# Patient Record
Sex: Female | Born: 1940
Health system: Southern US, Community
[De-identification: ages and names within clinical notes are randomized; demographics above are authoritative.]

## PROBLEM LIST (undated history)

## (undated) DIAGNOSIS — I471 Supraventricular tachycardia, unspecified: Secondary | ICD-10-CM

## (undated) DIAGNOSIS — I219 Acute myocardial infarction, unspecified: Secondary | ICD-10-CM

## (undated) DIAGNOSIS — I4719 Other supraventricular tachycardia: Secondary | ICD-10-CM

## (undated) DIAGNOSIS — I429 Cardiomyopathy, unspecified: Secondary | ICD-10-CM

## (undated) DIAGNOSIS — Z931 Gastrostomy status: Secondary | ICD-10-CM

## (undated) DIAGNOSIS — I10 Essential (primary) hypertension: Secondary | ICD-10-CM

## (undated) DIAGNOSIS — R1310 Dysphagia, unspecified: Secondary | ICD-10-CM

## (undated) DIAGNOSIS — M1990 Unspecified osteoarthritis, unspecified site: Secondary | ICD-10-CM

## (undated) DIAGNOSIS — I480 Paroxysmal atrial fibrillation: Secondary | ICD-10-CM

## (undated) DIAGNOSIS — D649 Anemia, unspecified: Secondary | ICD-10-CM

## (undated) DIAGNOSIS — I729 Aneurysm of unspecified site: Secondary | ICD-10-CM

## (undated) DIAGNOSIS — I5042 Chronic combined systolic (congestive) and diastolic (congestive) heart failure: Secondary | ICD-10-CM

## (undated) DIAGNOSIS — I2510 Atherosclerotic heart disease of native coronary artery without angina pectoris: Secondary | ICD-10-CM

## (undated) DIAGNOSIS — I6529 Occlusion and stenosis of unspecified carotid artery: Secondary | ICD-10-CM

## (undated) DIAGNOSIS — I4729 Other ventricular tachycardia: Secondary | ICD-10-CM

## (undated) DIAGNOSIS — E785 Hyperlipidemia, unspecified: Secondary | ICD-10-CM

## (undated) DIAGNOSIS — I472 Ventricular tachycardia: Secondary | ICD-10-CM

## (undated) DIAGNOSIS — I639 Cerebral infarction, unspecified: Secondary | ICD-10-CM

## (undated) HISTORY — DX: Other ventricular tachycardia: I47.29

## (undated) HISTORY — DX: Other supraventricular tachycardia: I47.19

## (undated) HISTORY — DX: Chronic combined systolic (congestive) and diastolic (congestive) heart failure: I50.42

## (undated) HISTORY — DX: Unspecified osteoarthritis, unspecified site: M19.90

## (undated) HISTORY — DX: Gastrostomy status: Z93.1

## (undated) HISTORY — DX: Supraventricular tachycardia, unspecified: I47.10

## (undated) HISTORY — DX: Dysphagia, unspecified: R13.10

## (undated) HISTORY — DX: Occlusion and stenosis of unspecified carotid artery: I65.29

## (undated) HISTORY — DX: Hyperlipidemia, unspecified: E78.5

## (undated) HISTORY — DX: Supraventricular tachycardia: I47.1

## (undated) HISTORY — DX: Paroxysmal atrial fibrillation: I48.0

## (undated) HISTORY — DX: Atherosclerotic heart disease of native coronary artery without angina pectoris: I25.10

## (undated) HISTORY — DX: Aneurysm of unspecified site: I72.9

## (undated) HISTORY — DX: Ventricular tachycardia: I47.2

## (undated) HISTORY — DX: Essential (primary) hypertension: I10

## (undated) HISTORY — DX: Cardiomyopathy, unspecified: I42.9

## (undated) HISTORY — DX: Acute myocardial infarction, unspecified: I21.9

## (undated) HISTORY — DX: Anemia, unspecified: D64.9

---

## 2003-11-28 ENCOUNTER — Ambulatory Visit (HOSPITAL_COMMUNITY): Admission: RE | Admit: 2003-11-28 | Discharge: 2003-11-28 | Payer: Self-pay | Admitting: Vascular Surgery

## 2003-12-02 ENCOUNTER — Ambulatory Visit (HOSPITAL_COMMUNITY): Admission: RE | Admit: 2003-12-02 | Discharge: 2003-12-02 | Payer: Self-pay | Admitting: Vascular Surgery

## 2004-03-31 ENCOUNTER — Encounter: Admission: RE | Admit: 2004-03-31 | Discharge: 2004-03-31 | Payer: Self-pay | Admitting: Neurology

## 2004-07-08 ENCOUNTER — Ambulatory Visit (HOSPITAL_COMMUNITY): Admission: RE | Admit: 2004-07-08 | Discharge: 2004-07-08 | Payer: Self-pay | Admitting: Neurology

## 2005-08-11 ENCOUNTER — Encounter: Payer: Self-pay | Admitting: Vascular Surgery

## 2005-08-11 ENCOUNTER — Ambulatory Visit (HOSPITAL_COMMUNITY): Admission: RE | Admit: 2005-08-11 | Discharge: 2005-08-11 | Payer: Self-pay | Admitting: Neurology

## 2005-08-25 ENCOUNTER — Ambulatory Visit (HOSPITAL_COMMUNITY): Admission: RE | Admit: 2005-08-25 | Discharge: 2005-08-25 | Payer: Self-pay | Admitting: Neurology

## 2006-04-18 ENCOUNTER — Encounter: Admission: RE | Admit: 2006-04-18 | Discharge: 2006-04-18 | Payer: Self-pay | Admitting: Neurology

## 2006-12-27 ENCOUNTER — Ambulatory Visit: Payer: Self-pay | Admitting: Vascular Surgery

## 2007-01-06 ENCOUNTER — Inpatient Hospital Stay (HOSPITAL_COMMUNITY): Admission: RE | Admit: 2007-01-06 | Discharge: 2007-01-07 | Payer: Self-pay | Admitting: Vascular Surgery

## 2007-01-06 ENCOUNTER — Ambulatory Visit: Payer: Self-pay | Admitting: Vascular Surgery

## 2007-01-06 ENCOUNTER — Encounter: Payer: Self-pay | Admitting: Vascular Surgery

## 2007-01-06 HISTORY — PX: CAROTID ENDARTERECTOMY: SUR193

## 2007-01-24 ENCOUNTER — Ambulatory Visit: Payer: Self-pay | Admitting: Vascular Surgery

## 2007-08-01 ENCOUNTER — Ambulatory Visit: Payer: Self-pay | Admitting: Vascular Surgery

## 2008-02-06 ENCOUNTER — Ambulatory Visit: Payer: Self-pay | Admitting: Vascular Surgery

## 2008-09-10 ENCOUNTER — Ambulatory Visit: Payer: Self-pay | Admitting: Vascular Surgery

## 2010-02-11 ENCOUNTER — Ambulatory Visit: Payer: Self-pay | Admitting: Vascular Surgery

## 2010-03-22 HISTORY — PX: HERNIA REPAIR: SHX51

## 2010-04-12 ENCOUNTER — Encounter: Payer: Self-pay | Admitting: Neurology

## 2010-08-04 NOTE — Procedures (Signed)
CAROTID DUPLEX EXAM   INDICATION:  Followup, carotid artery disease.   HISTORY:  Diabetes:  Yes.  Cardiac:  MI.  Hypertension:  Yes.  Smoking:  Quit.  Previous Surgery:  Right CEA with DPA, 01/06/07.  CV History:  Asymptomatic.  Amaurosis Fugax No, Paresthesias No, Hemiparesis No                                       RIGHT             LEFT  Brachial systolic pressure:         118               114  Brachial Doppler waveforms:         Biphasic          Biphasic  Vertebral direction of flow:        Antegrade (low flow)                Antegrade  DUPLEX VELOCITIES (cm/sec)  CCA peak systolic                   96                93  ECA peak systolic                   438               [id number]  ICA peak systolic                   254               [id number]  ICA end diastolic                   102               No diastolic  PLAQUE MORPHOLOGY:                  Intimal hyperplasia                 Mixed  PLAQUE AMOUNT:                      Moderate/severe   Severe  PLAQUE LOCATION:                    ICA (distal patch)/ECA              ICA   IMPRESSION:  1. The right internal carotid artery shows evidence of 60-79%      restenosis (high end of range), status post carotid endarterectomy      at the distal end of the patch.  2. Right external carotid artery stenosis.  3. Left internal carotid artery flow is abnormal without diastolic      flow, consistent with known distal stenosis.  4. Bilateral vertebral arteries appear antegrade; however, low flow      noted in the right artery.   ___________________________________________  [name]. Scot Dock, M.D.   AS/MEDQ  D:  08/01/2007  T:  08/01/2007  Job:  [id number]

## 2010-08-04 NOTE — Op Note (Signed)
[name], [name]              ACCOUNT NO.:  [id number]   MEDICAL RECORD NO.:  [id number]          PATIENT TYPE:  INP   LOCATION:  2899                         FACILITY:  [facility]   PHYSICIAN:  [name]. Scot Dock, M.D.DATE OF BIRTH:  [birth year]   DATE OF PROCEDURE:  01/06/2007  DATE OF DISCHARGE:                               OPERATIVE REPORT   PREOPERATIVE DIAGNOSIS:  Greater than 80% right carotid stenosis.   POSTOPERATIVE DIAGNOSIS:  Greater than 80% right carotid stenosis.   PROCEDURE:  Right carotid endarterectomy with Dacron patch angioplasty.   INDICATIONS:  This is a 70 year old woman who I had originally seen back  in September 2005, when she had an episode of amaurosis fugax in the  left eye.  She underwent a cerebral arteriogram at that time which  showed only a moderate right carotid stenosis and diffuse intracranial  disease in the left.  She was not a candidate for left carotid  endarterectomy.  The stenosis on the right progressed to greater than  80%.  Given the essentially occluded internal carotid artery on the left  with a greater than 80% right carotid stenosis, right carotid  endarterectomy was recommended order to lower her risk of future stroke.  Note, she did have some mild distal carotid disease of carotid siphon.  The procedure and potential complications including but not limited to  bleeding, stroke (periprocedural risk 1-2%), nerve injury, MI, or other  unpredictable medical problems were discussed with the patient and all  of her questions were answered.  She was agreeable to proceed.   DESCRIPTION OF PROCEDURE:  The patient was taken to the operating room  and received a general anesthetic.  Arterial line had been placed by  anesthesia.  The right neck was prepped and draped in the usual sterile  fashion.  Incision was made along the anterior border of the  sternocleidomastoid and dissection carried down to the common carotid  artery which was  dissected free and controlled with a Rumel tourniquet.  The facial vein was divided between 2-0 silk ties.  The internal carotid  artery was controlled above the plaque as was the external carotid  artery and superior thyroid artery.  The patient was then heparinized.  Clamps were then placed on the internal, then the common, then the  external carotid artery.  A longitudinal arteriotomy was made in the  common carotid artery and this was extended through the plaque into the  internal carotid artery above the plaque.  A 12 shunt was placed into  the internal carotid artery, backbled and placed into the common carotid  artery and secured with Rumel tourniquet.  Flow was reestablished to the  shunt.  An endarterectomy plane was established proximally and the  plaque was sharply divided.  Eversion endarterectomy was performed of  the external carotid artery.  Distally there was a nice taper in the  plaque and no tacking sutures were required.  The artery was irrigated  with copious amounts of heparin and dextran and all loose debris was  removed.  Dacron patch was then sewn using continuous 6-0 Prolene  suture.  Prior to completing the patch closure, the shunt was removed.  The artery was backbled and flushed appropriately and anastomosis  completed.  Flow was reestablished first to the external carotid artery  and then to the internal carotid artery.  At the completion, there was a  good pulse distal to the patch and a good Doppler signal.  Hemostasis  was obtained in the wound.  The wound was closed with a deep layer of 3-  0 Vicryl.  The skin closed with 4-0 Vicryl.  Sterile dressing was  applied.  The patient tolerated procedure well and was transferred to  the recovery room in satisfactory condition.  All needle and sponge  counts were correct.      [name]. Scot Dock, M.D.  Electronically Signed     CSD/MEDQ  D:  01/06/2007  T:  01/08/2007  Job:  [id number]   cc:   Legrand Como L.  Doy Mince, M.D.

## 2010-08-04 NOTE — Assessment & Plan Note (Signed)
OFFICE VISIT   Niederer, Darsha B  DOB:  1940-10-13                                       01/24/2007  [id number]   HISTORY:  I saw Ms. Sartwell in the office today for followup after her  recent right carotid endarterectomy.  This is a pleasant 70 year old  woman who I had originally seen back in September 2005 when she had an  episode of amaurosis fugax in her left eye.  She had a cerebral  arteriogram at that time and at that time only had a moderate right  carotid stenosis and diffuse intracranial disease in the left eye with  no options for revascularization on the left.  She presented for a  follow up visit.  It was found that her right carotid stenosis had now  progressed to greater than 80%, therefore right carotid endarterectomy  was recommended in order to lower her risk of future stroke.   She underwent a right carotid endarterectomy on January 06, 2007.  She  did well postoperatively and was discharged on postoperative day number  1.  She returns for her first followup visit.  Overall, she has been  doing quite well and has no specific complaints.  She has had no focal  weakness or paresthesias.   PHYSICAL EXAMINATION:  VITAL SIGNS:  Blood pressure 104/63, heart rate  74.  NECK:  Her incision is healing nicely.  She has a right carotid bruit.  NEUROLOGIC:  Nonfocal.  She has good strength in her upper extremities  and lower extremities bilaterally.   Overall, I am pleased with her progress and I will see her back in 6  months for a follow up duplex scan.   DISCHARGE INSTRUCTIONS:  1. She knows to call sooner if she has problems.  2. In the meantime, she knows to continue taking her aspirin.   [name]. Scot Dock, M.D.  Electronically Signed   CSD/MEDQ  D:  01/24/2007  T:  01/25/2007  Job:  [id number]   cc:   Legrand Como L. Doy Mince, M.D.

## 2010-08-04 NOTE — Procedures (Signed)
CAROTID DUPLEX EXAM   INDICATION:  Follow-up evaluation of known carotid artery disease.   HISTORY:  Diabetes:  Yes.  Cardiac:  No.  Hypertension:  Yes.  Smoking:  Former smoker.  Previous Surgery:  Right carotid endarterectomy with Dacron patch  angioplasty on 01/06/2007.  CV History:  Patient reports no cerebrovascular symptoms at this time.  Previous duplex on 08/01/07 revealed a 60-79% right ICA stenosis and  left ICA occlusion.  Amaurosis Fugax No, Paresthesias No, Hemiparesis No.                                       RIGHT             LEFT  Brachial systolic pressure:         136               138  Brachial Doppler waveforms:         Triphasic         Triphasic  Vertebral direction of flow:        Antegrade         Antegrade  DUPLEX VELOCITIES (cm/sec)  CCA peak systolic                   99                58  ECA peak systolic                   369               [id number]  ICA peak systolic                   212               Occluded  ICA end diastolic                   82                Occluded  PLAQUE MORPHOLOGY:                  Intimal hyperplasia                 Mixed  PLAQUE AMOUNT:                      Moderate          Severe  PLAQUE LOCATION:                    Proximal ICA, ECA Throughout ICA   IMPRESSION:  1. 60-79% right internal carotid artery stenosis, status post      endarterectomy.  2. Occluded left internal carotid artery.  3. Right external carotid artery stenosis.  4. No significant change from previous study.   ___________________________________________  [name]. Scot Dock, M.D.   MC/MEDQ  D:  02/06/2008  T:  02/06/2008  Job:  [id number]

## 2010-08-04 NOTE — Assessment & Plan Note (Signed)
OFFICE VISIT   Cayabyab, Angell B  DOB:  [birth year]                                       08/01/2007  [id number]   I saw the patient in the office today for continued followup after her  right carotid endarterectomy which was performed in October of 2008.  She comes in for 57-month followup visit.  Since I saw her last in  November of 2008, she denies any history of stroke, TIAs, expressive or  receptive aphasia, or amaurosis fugax.   REVIEW OF SYSTEMS:  She has had no recent chest pain, chest pressure,  palpitations or arrhythmias.  She has had no bronchitis, asthma or  wheezing.   PHYSICAL EXAMINATION:  This is a pleasant 70 year old woman who appears  her stated age.  Blood pressure is 121/64, heart rate is 82.  She has a  right carotid bruit.  Lungs are clear bilaterally to auscultation.  On  cardiac exam, she has a regular rate and rhythm.  Neurologic exam is  nonfocal.   Her carotid duplex scan shows an increased velocities at the distal end  of her patch producing a 60-79% recurrent stenosis.  I suspect this is  intimal hyperplasia which often times will resolve with time.  She has a  known distal stenosis on the left with no diastolic flow noted on the  left, but this is not new.   Again, I think she has some intimal hyperplasia at the distal aspect of  her carotid endarterectomy site, and I have recommended followup duplex  scan in 6 months.  We will see her back at that time.  She knows to call  sooner if she has problems.  In the meantime she knows to continue  taking her aspirin.   [name]. Scot Dock, M.D.  Electronically Signed   CSD/MEDQ  D:  08/01/2007  T:  08/02/2007  Job:  [id number]

## 2010-08-04 NOTE — Procedures (Signed)
CAROTID DUPLEX EXAM   INDICATION:  Carotid artery disease.   HISTORY:  Diabetes:  Yes.  Cardiac:  No.  Hypertension:  Yes.  Smoking:  Previous.  Previous Surgery:  Right carotid endarterectomy on 01/06/07.  CV History:  Currently asymptomatic.  Amaurosis Fugax No, Paresthesias No, Hemiparesis No.                                       RIGHT             LEFT  Brachial systolic pressure:         118               116  Brachial Doppler waveforms:         Normal            Normal  Vertebral direction of flow:        Antegrade         Antegrade  DUPLEX VELOCITIES (cm/sec)  CCA peak systolic                   77                [id number]  ECA peak systolic                   329               0000000  ICA peak systolic                   204               Occluded  ICA end diastolic                   64  PLAQUE MORPHOLOGY:                  Heterogenous      Mixed  PLAQUE AMOUNT:                      Moderate          Occlusive  PLAQUE LOCATION:                    ECA               ICA/ECA/CCA   IMPRESSION:  1. Doppler velocities suggest a 60-79% stenosis of the right internal      carotid artery; however, this is most likely due to a kink in the      vessel, since no plaque formation was visualized.  2. Total occlusion of the left internal carotid artery.  3. No significant change noted when compared to the previous      examination on 02/06/08.   ___________________________________________  [name]. Scot Dock, M.D.   CH/MEDQ  D:  09/10/2008  T:  09/10/2008  Job:  [id number]

## 2010-08-04 NOTE — Assessment & Plan Note (Signed)
OFFICE VISIT   [name], [name]  DOB:  06-19-1940                                       02/06/2008  [id number]   I saw the patient in the office today for continued followup of her  carotid disease.  She had undergone a right carotid endarterectomy in  October of 2008 and on her 40-month followup visit, she had some  increased velocities at the distal end of her patch producing a 60-79%  stenosis.  She had been asymptomatic.  I felt that this was likely  related to intimal hyperplasia and she comes in for 82-month followup  visit.   Since I saw her last, she has had no history of stroke, TIAs, expressive  or receptive aphasia, or amaurosis fugax.   REVIEW OF SYSTEMS:  She has had no chest pain, chest pressure,  palpitations, or arrhythmias.  She has had no bronchitis, asthma or  wheezing.   PHYSICAL EXAMINATION:  This is a pleasant 70 year old woman who appears  her stated age.  Her blood pressure is 114/64, heart rate is 68.  She  has a right carotid bruit.  Lungs are clear bilaterally to auscultation.  Cardiac exam, she has a regular rate and rhythm without murmur.  Neurologic exam is nonfocal.   Carotid duplex scan shows that the stenosis on the right is still in the  60-79% range, although the velocities are slightly less.  This would be  consistent with that intimal hyperplasia.  She has a known left internal  carotid artery occlusion.   Overall, I am pleased with her progress.  She understands that we would  not consider redo right carotid endarterectomy unless the stenosis  progressed to greater than 80% or she developed new neurologic symptoms.  I will see her back in 6 months for a followup duplex scan.  She knows  to call sooner if she has problems.  In the meantime, she knows to  continue taking her aspirin.   [name]. Scot Dock, M.D.  Electronically Signed   CSD/MEDQ  D:  02/06/2008  T:  02/07/2008  Job:  [id number]

## 2010-08-04 NOTE — Assessment & Plan Note (Signed)
OFFICE VISIT   [name], [name]  DOB:  1940/06/08                                       12/27/2006  [id number]   I saw the patient in the office today for continued followup of her  carotid disease.  She was referred by Dr. Doy Mince.  I had originally  seen her back in September of 2005 when she had an episode of amaurosis  fugax of her left eye.  The duplex showed a very tight left carotid  stenosis.  However, there was a concern of a distal occlusion.  She  underwent a cerebral arteriogram at that time that showed a moderate 20-  30% right carotid stenosis and diffuse intracranial disease on the left.  She was not felt to be a candidate for left carotid endarterectomy.  At  that time, Dr. Maree Erie interpreted her intracranial results to show  irregularity in the carotid siphon on the right with narrowing of  approximately 30-50%.  The left internal carotid artery was only patent  as far as the ophthalmic artery.  The right carotid has been followed by  Dr. Doy Mince, and on their most recent duplex study done at their office  the stenosis had progressed to greater than 80%.  She was referred for  consideration for right carotid endarterectomy.  Of note, she had had a  cerebral arteriogram by Dr. Estanislado Pandy in April of 2006, which showed a  moderate 40-50% right carotid stenosis.  There was disease involving the  petrous cavernous and distal cavernous portions with multiple focal  areas of narrowing, with the most severe narrowing in the distal  interval carotid artery being approximately 50%.  The right vertebral  artery was noted to be occluded also.   Since I saw the patient back in 2005, she has had no history of stroke,  TIA, expressive or receptive aphasia, or amaurosis fugax.   PAST MEDICAL HISTORY:  1. Significant for insulin dependent diabetes.  2. Hypertension.  3. Hypercholesterolemia.  4. History of a remote myocardial infarction.  5.  Denies any history of congestive heart failure or history of COPD.   FAMILY HISTORY:  There is no history of premature cardiovascular  disease.   SOCIAL HISTORY:  She is widowed.  She has 1 son and multiple  grandchildren.  She was here today with one of her grandchildren.  She  has been smoking since she was 70 years old.  She has smoked less than a  pack per day and has now cut back to about 4 cigarettes a day.  She does  not use alcohol on a regular basis.   MEDICATION ALLERGIES:  No known drug allergies.   MEDICATIONS:  1. Cilostazol 100 mg p.o. Wu.i.d.  2. Labetalol 300 mg p.o. Wu.i.d.  3. Diovan hydrochlorothiazide 320/12.5 one p.o. daily.  4. Caduet 10 mg 1 p.o. daily.  5. Clonidine 0.1 mg p.o. nightly.  6. Glipizide metformin 500 mg 1 p.o. Wu.i.d.  7. Digoxin 0.125 mg p.o. daily.  8. Lovaza 1000 mg 2 pills Wu.i.d.  9. Januvia 100 mg 1 p.o. daily.  10.Aspirin 81 mg p.o. daily.   REVIEW OF SYSTEMS:  GENERAL:  She has had no weight loss or weight  gains, problems with her appetite.  CARDIAC:  She has had no chest pain, chest pressure, palpitations, or  arrhythmias.  PULMONARY:  She has had no productive cough, bronchitis, asthma, or  wheezing.  GASTROINTESTINAL:  She has had no recent change in her bowel habits and  has no history of peptic ulcer disease.  GENITOURINARY:  She has had no dysuria or frequency.  VASCULAR:  She has had no claudication, rest pain, non-healing ulcers.  She has had a previous history of amaurosis fugax on the left eye as  described above.  NEURO:  She has had no dizziness, black outs, headaches, or seizures.  ORTHO/SKIN:  She does have a history of arthritis.  PSYCHIATRIC:  She has no problems with depression or nervousness.  HEMATOLOGICAL:  She has had no bleeding problems or clotting disorders.   I have reviewed her carotid duplex study from [facility], and also reviewed her previous arteriogram from April of  2006.  The  stenosis of the right carotid bifurcation has progressed to  greater than 80%.  For this reason, I have recommended right carotid  endarterectomy in order to lower her risk of future stroke.  The  situation is complicated by her distal carotid disease on the right.  However, given the left carotid occlusion, I think she would be at high  risk for stroke if the proximal right carotid stenosis is not addressed.  We have discussed the indications for the procedure and the potential  complications, including but not limited to bleeding, stroke  (periprocedural risk 1 to 2%), nerve injury, MI, or other unpredictable  medical problems.  All of her questions were answered and she is  agreeable to proceed.  However, she would like to discuss this with her  son first before scheduling surgery.  She understands to remain on her  aspirin right up through surgery.   [name]. Scot Dock, M.D.  Electronically Signed   CSD/MEDQ  D:  12/27/2006  T:  12/28/2006  Job:  [id number]   cc:   Legrand Como L. Doy Mince, M.D.

## 2010-08-04 NOTE — Assessment & Plan Note (Signed)
OFFICE VISIT   [name], [name]  DOB:  October 07, 1940                                       09/10/2008  [id number]   I saw the patient in the office today for continued followup of her  carotid disease.  She has a known left internal carotid artery occlusion  and she had a previous right carotid endarterectomy in October of 2008.  I have been following an area of possible increased velocity in the  right carotid artery which is more likely related to a kink as there is  really no plaque visualized.  She was last seen in November of 2009 and  comes in for a 6 month followup study.   Since I saw her last she has had no history of stroke, TIAs, expressive  or receptive aphasia or amaurosis fugax.   REVIEW OF SYSTEMS:  She has had no chest pain, chest pressure,  palpitations or arrhythmias.  She has had no productive cough,  bronchitis, asthma or wheezing.   PHYSICAL EXAMINATION:  This is a pleasant 70 year old woman who appears  her stated age.  Her blood pressure is 109/51, heart rate is 74.  She  has a right carotid bruit.  Lungs are clear bilaterally to auscultation.  On cardiac exam she has a regular rate and rhythm.  Neurologic exam is  nonfocal.   Carotid duplex scan shows evidence of a 60-79% right carotid stenosis by  velocity criteria, however, the velocities have decreased compared to  the previous study and are in the lower end of that range.  No plaque is  visualized and this may simply be related to some tortuosity in the  vessel.  The vertebral arteries are patent bilaterally.  She has a known  left internal carotid artery occlusion.   Given the improvement in velocities in the right I think it is safe to  stretch her followup out to 9 months and I will see her back at that  time.  She knows to call sooner if she has problems.  In the meantime  she knows to continue taking her aspirin.   [name]. Scot Dock, M.D.  Electronically  Signed   CSD/MEDQ  D:  09/10/2008  T:  09/11/2008  Job:  [id number]

## 2010-08-04 NOTE — Procedures (Signed)
CAROTID DUPLEX EXAM   INDICATION:  Carotid artery disease.   HISTORY:  Diabetes:  Yes.  Cardiac:  No.  Hypertension:  Yes.  Smoking:  Previous.  Previous Surgery:  Right carotid endarterectomy 01/06/2007.  CV History:  Patient is currently asymptomatic.  Amaurosis Fugax No, Paresthesias No, Hemiparesis No.                                       RIGHT             LEFT  Brachial systolic pressure:         118               114  Brachial Doppler waveforms:         WNL               WNL  Vertebral direction of flow:        Antegrade  DUPLEX VELOCITIES (cm/sec)  CCA peak systolic                   97  ECA peak systolic                   [id number]  ICA peak systolic                   0000000  ICA end diastolic                   63  PLAQUE MORPHOLOGY:                  Heterogenous  PLAQUE AMOUNT:                      Moderate  PLAQUE LOCATION:                    ECA   IMPRESSION:  1. There is a 40% to 59% stenosis in the right internal carotid      artery, probably due to vessel tortuosity.  2. Velocity is not as elevated today compared to previous study.  3. Study is stable compared to previous study.   ___________________________________________  Rosetta Posner, M.D.   OD/MEDQ  D:  02/11/2010  T:  02/11/2010  Job:  [id number]

## 2010-08-07 NOTE — Op Note (Signed)
NAME:  [name], [name]                        ACCOUNT NO.:  [id number]   MEDICAL RECORD NO.:  [id number]                   PATIENT TYPE:  OIB   LOCATION:                                       FACILITY:  [facility]   PHYSICIAN:  [name]. Scot Dock, M.D.        DATE OF BIRTH:  [birth year]   DATE OF PROCEDURE:  12/02/2003  DATE OF DISCHARGE:                                 OPERATIVE REPORT   PREOPERATIVE DIAGNOSIS:  Symptomatic left carotid stenosis.   POSTOPERATIVE DIAGNOSIS:  Symptomatic left carotid stenosis.   PROCEDURES:  1.  Arch aortogram.  2.  Selective right common carotid arteriogram.  3.  Selective left common carotid arteriogram.   SURGEON:  [name]. Scot Dock, M.D.   ANESTHESIA:  Local.   INDICATIONS FOR PROCEDURE:  This is a 70 year old woman who had had an  episode of amaurosis fugax in the left eye.  Duplex scan showed a very tight  left CARTO stenosis, however, there was essentially no diastolic flow  suggesting a distal occlusion.  For this reason, it was recommended that she  undergo a cerebral arteriography in order to determine if this stenosis and  disease was surgically accessible.  The procedure and potential  complications including, but not limited to, bleeding, arterial injury,  stroke (periprocedural risk 1 to 2%), dye reaction and kidney failure were  discussed with the patient.  All of her questions were answered and she was  agreeable to proceed.   DESCRIPTION OF PROCEDURE:  The patient was taken to the [facility] lab at [facility].  [facility].  The groins were prepped and draped in the usual  sterile fashion.  After the skin was infiltrated with 1% lidocaine, the  right common femoral artery was cannulated and a guidewire introduced into  the infrarenal aorta under fluoroscopic control.  A 5 French sheath was  passed over the wire and the dilator removed.  The wire was then advanced  and the long pigtail catheter advanced into the ascending  aorta.  Arch  aortogram was obtain in a 69 degree LAO projection.  The catheter then was  exchanged for an H1 catheter which was positioned into the innominate  artery.  The guidewire was then advanced into the right common carotid  artery. The H1 catheter was exchanged for an end hole catheter which was  positioned into the proximal right common carotid artery.  Selective right  common carotid arteriogram was obtained with both intracranial and  extracranial views.  Of note, the intracranial views will be dictated  separately by the neuroradiologist.  Next, the catheter was exchanged for  the H1 catheter and this was positioned in the left common carotid artery.  Selective left common carotid arteriogram was obtained.   FINDINGS:  The arch is widely patent with no significant occlusive disease.  Bilateral subclavians and bilateral common carotid arteries are widely  patent with no stenosis identified.  The left vertebral  artery is patent.  The right vertebral artery is not identified.   Selective injection of the right side shows a focal plaque at the  bifurcation producing an approximately 20 to 30% stenosis.  There is a  calcific plaque here.  Above the plaque, the internal carotid artery is  widely patent.   On the left side, there is a very tight preocclusive stenosis of the  proximal left internal carotid artery.  Above this the artery is quite small  and then there is a distal stenosis below the carotid siphon which is very  tight.  There appears to be moderate diffuse disease of the intracranial  portion but again, this was dictated separately by the neuroradiologist.   CONCLUSION:  1.  Moderate 20 to 30% right carotid stenosis at the bifurcation.  2.  Critical proximal left internal carotid artery stenosis with a distal      very tight preocclusive stenosis and moderate diffuse intracranial      disease on the left.                                                [name]. Scot Dock, M.D.    CSD/MEDQ  D:  12/02/2003  T:  12/02/2003  Job:  [id number]   cc:   [facility] Lab at [facility]

## 2010-08-07 NOTE — H&P (Signed)
[name], [name]                          ACCOUNT NO.:  [id number]   MEDICAL RECORD NO.:  [id number]                   PATIENT TYPE:  OIB   LOCATION:  NA                                   FACILITY:  [facility]   PHYSICIAN:  [name]. Scot Dock, M.D.        DATE OF BIRTH:  [birth year]   DATE OF ADMISSION:  DATE OF DISCHARGE:                                HISTORY & PHYSICAL   REASON FOR ADMISSION:  Symptomatic right carotid stenosis.   HISTORY:  This is a pleasant 70 year old woman who 3 weeks ago had the  sudden onset of blindness in the left eye consistent with left eye amaurosis  fugax.  These symptoms lasted approximately 10-20 minutes and then resolved  completely.  She described this as like a blind being pulled over her eye.  She has had no previous history of stroke, TIAs, expressive or receptive  aphasia.  Of note, she is right-handed.  She did have a carotid Duplex scan  at [facility] as part of a workup for this and this showed evidence  of a 60% to 79% right carotid stenosis with a very tight pre-occlusive left  carotid stenosis.  On the left side, the peak systolic velocity was 36  cm/sec with a 7-cm/sec end-diastolic velocity.  She was sent to our office  for further evaluation concerning this tight left carotid stenosis.   PAST MEDICAL HISTORY:  Her past medical history is significant for:  1.  Type 2 diabetes.  She does not require insulin.  2.  Hypertension.  3.  Hypercholesterolemia.  4.  History of coronary artery disease.  She had a myocardial infarction      approximately 10 years ago at which time she had undergone PTCA.  She      does not know the name of her cardiologist.  5.  She denies any history of congestive heart failure or emphysema.   FAMILY HISTORY:  There is no history of premature cardiovascular disease.   SOCIAL HISTORY:  She is widowed; she has 1 child.  She smokes 10 cigarettes  a day.   ALLERGIES:  No known drug allergies.   MEDICATIONS:  1.  Norvasc 10 mg p.o. daily.  2.  Glipizide XL 5 mg p.o. daily.  3.  Lasix 20 mg p.o. every other day.  4.  Diovan 320 mg 1 p.o. daily.  5.  Digoxin 0.125 mg p.o. daily.  6.  Labetalol 200 mg p.o. b.i.d.  7.  Trental 400 mg p.o. t.i.d.  8.  Lipitor 10 mg half a tab p.o. daily.  9.  Ecotrin 81 mg p.o. t.i.d.   REVIEW OF SYSTEMS:  GENERAL:  She denies any weight loss, weight gain or  problems with her appetite.  She has had no fever.  CARDIAC:  She denies  chest pain, chest pressure, shortness of breath, orthopnea, palpitations or  arrhythmias.  PULMONARY:  She denies any history of bronchitis, asthma  or  wheezing.  GI:  She has had on recent change in her bowel habits and has no  history of peptic ulcer disease.  GU:  She denies any dysuria or frequency.  VASCULAR:  She denies claudication, rest pain or non-healing ulcers.  She  has had no previous history of phlebitis.  NEUROLOGIC:  She has occasional  dizziness and headaches.  She has had no blackouts or seizures.  ORTHOPEDICS/SKIN:  She has had no arthritis, joint pain, muscle pain or  rash.  PSYCHIATRIC:  She denies any depression or nervousness.  HEMATOLOGIC:  She denies any bleeding problems or clotting disorders.   PHYSICAL EXAMINATION:  VITAL SIGNS:  Blood pressure is 152/60 on the left  and 144/58 on the right.  Heart rate is 64.  NECK:  There is no cervical lymphadenopathy.  She has a right carotid bruit.  LUNGS:  Lungs are clear bilaterally to auscultation.  CARDIAC:  On cardiac exam, she has a regular rate and rhythm.  ABDOMEN:  Her abdomen is soft and nontender.  I cannot palpate an aneurysm.  EXTREMITIES:  She has palpable femoral pulses and a palpable left dorsalis  pedis pulse.  I cannot palpate pedal pulses on the right side.  She has no  significant lower extremity swelling.  NEUROLOGIC:  Exam is nonfocal.   ASSESSMENT AND PLAN:  We repeated her Duplex scan in the office today and  this shows a peak  systolic velocity on the right of 171 cm/sec with an end-  diastolic velocity of 43 cm/sec.  This would be consistent with a 60% to 79%  right carotid stenosis, probably in the lower end of that range.  On the  left side, there is no end-diastolic flow.  Peak systolic velocity is A999333  cm/sec.  This could potentially be consistent with a distal left internal  carotid artery occlusion.  Both vertebral arteries are antegrade.   Given her episode of left eye amaurosis fugax with a right left carotid  stenosis, I have recommended left carotid endarterectomy in order to lower  her risk of stroke.  However, given the Duplex findings, I think it would be  necessary to obtain a cerebral arteriogram to be sure that this is something  that is surgically accessible and to be sure that she does not have a distal  left internal carotid artery occlusion.  For this reason, she is scheduled  for cerebral arteriography on December 02, 2003.  We have discussed the  incisions for this procedure and the potential complications including, but  not limited to, bleeding, arterial injury, and peri-procedural stroke (risks  1% to 2%).  Assuming that she has a surgically correctable lesion, I have  recommended left carotid endarterectomy in order to lower her risk of  stroke.  Her surgery will be scheduled  for the following day.  We have  discussed the indications for the procedure and the potential complications  including, but not limited to, bleeding, myocardial infarction, nerve  injury, and perioperative stroke risk of 1% to 2%.  All of her questions  were answered and she is agreeable to proceed.  She knows to continue her  aspirin right up to surgery.  If she does not have a surgically correctable  lesion, we will have to see if she might need to be seen by the neurologist  to be considered for Coumadin therapy.  [name]. Scot Dock, M.D.    CSD/MEDQ  D:   11/27/2003  T:  11/27/2003  Job:  [id number]   cc:   Franklyn Lor  [address]  [place]  [place] [address]  Fax: [phone]

## 2010-08-07 NOTE — Consult Note (Signed)
NAME:  [name], [name]                        ACCOUNT NO.:  [id number]   MEDICAL RECORD NO.:  [id number]                   PATIENT TYPE:  OIB   LOCATION:  2899                                 FACILITY:  [facility]   PHYSICIAN:  Marney Setting. Maree Erie, M.D.                DATE OF BIRTH:  07/30/1940   DATE OF CONSULTATION:  DATE OF DISCHARGE:  12/02/2003                                   CONSULTATION   Consultation for interpretation of intracranial views from a cerebral  arteriogram done on December 02, 2003 by Dr. Scot Dock.   CLINICAL HISTORY:  Amaurosis fugax on the left.   Right internal carotid arteriogram:  This vessel is opacified via a common  carotid injection.  There is marked atherosclerotic irregularity in the  carotid siphon.  I think there is luminal narrowing of about 30-50% in that  region.  The vessel does remain patent, however, and flow from this  injection goes on to supply the right middle cerebral artery territory, both  anterior cerebral artery territories, and the left middle cerebral artery  territory indicating marked reduction in end-flow via the left internal  carotid.  Of the intracranial vessels opacified, I do not see stenosis,  aneurysm, or vascular malformation.  There is a loop in the region of the  anterior cerebral on the left.  The parenchymal and venous phases appear  normal.   Left internal carotid arteriogram:  This vessel is opacified via a common  carotid injection.  There is thready flow within the left internal carotid  artery which is a small vessel, patent only to supply the ophthalmic.  I do  not see any intracranial contribution from this vessel.  The patient does  have some intermittent flash filling of the basilar artery which gives some  supply to the anterior circulation through a patent posterior communicating  artery.   IMPRESSION:  The left internal carotid artery is a severely diseased vessel  which is patent only as far as the ophthalmic  artery.  I do not identify any  intracranial flow from this vessel.  Flow to the intracranial anterior  circulation is given primarily via the right internal carotid artery which  shows pronounced atherosclerotic irregularity in the carotid siphon region  with narrowing of about 30-50%, but does give supply to both anterior and  middle cerebral artery territories.      MES/MEDQ  D:  12/06/2003  T:  12/08/2003  Job:  [id number]

## 2010-08-07 NOTE — H&P (Signed)
[name], [name]              ACCOUNT NO.:  [id number]   MEDICAL RECORD NO.:  [id number]          PATIENT TYPE:  OIB   LOCATION:  2899                         FACILITY:  [facility]   PHYSICIAN:  Sanjeev K. Deveshwar, M.D.DATE OF BIRTH:  03-Jan-1941   DATE OF ADMISSION:  07/08/2004  DATE OF DISCHARGE:                                HISTORY & PHYSICAL   CHIEF COMPLAINT:  This patient is here for a cerebral angiogram to be  performed today by Dr. Fritz Pickerel. Deveshwar.   HISTORY OF PRESENT ILLNESS:  This is a 70 year old female with a history of  carotid artery disease.  The patient had previously undergone a cerebral  angiogram in September of 2005 performed by Dr. Scot Dock; unfortunately, we  do not have these results.  I understand that the left carotid has a much  greater stenosis than the right.  The patient had been evaluated for a stent  in the past, but not felt to be a candidate.  Her initial presentation was  that of amaurosis fugax.  She has been treated with Coumadin in the interim;  however, she has continued to have symptoms; she tells me she had headaches,  pressure in the left side of her head and dizziness.  She underwent an MRA  of the neck on March 31, 2004; again, this report is not available, but  apparently showed high-grade blockage of the left carotid artery; the  patient is now referred for repeat angiogram and further evaluation by Dr.  Estanislado Pandy.   PAST MEDICAL HISTORY:  Significant for:  1.  Diabetes mellitus.  2.  History of hyperlipidemia.  3.  There has been some question as to whether or not the patient has had a      CVA in the past.  4.  She has hypertension.  5.  She has a history of coronary artery disease.  6.  She had an MI approximately 20 years ago and underwent an angioplasty at      [facility].  She does not remember who her cardiologist was at      that time.  7.  As noted, she had a cerebral angiogram performed in September of  2005 by      Dr. Scot Dock.  8.  She is status post D&C approximately 40 years ago.   ALLERGIES:  No known drug allergies.   CURRENT MEDICATIONS:  The patient is uncertain of her medications; she did  not bring her medications or a list.  She has been on Coumadin which she  believes was 5 mg per day; this has been on hold since Sunday.  She states  her medications have not changed significantly since 2005; at that time,  according to Dr. Regenia Skeeter records, the patient was taking:  1.  Glucotrol 5 mg daily.  2.  Diovan 320 mg daily.  3.  Norvasc 10 mg daily.  4.  Lanoxin 0.125 mg daily.  5.  Lipitor 5 mg daily.  6.  Lasix 20 mg every other day.  7.  Normodyne 200 mg b.i.d.  8.  Trental 400 mg  t.i.d.  9.  Baby aspirin.   As noted, we have been unable to confirm her current medications.   SOCIAL HISTORY:  The patient is divorced.  She has 1 son and 3  grandchildren.  She lives in [place].  Her son and grandchildren live with  her.  She quit smoking 6 months ago; she previously smoked 1/2 pack per day  for 15-20 years.  She has a remote alcohol use, but has since quit using  alcohol.  She is disabled secondary to her MI.   FAMILY HISTORY:  Her mother died in her 37s; she had diabetes mellitus.  Her  father died in his 11s or 39s, cause unknown.  She has 3 brothers and 2  sisters, most of whom have diabetes; 1 had an MI; most have hyperlipidemia.   REVIEW OF SYSTEMS:  The patient states that she stays cold all the time  since starting Coumadin.  She has had some recent headaches and a pressure  sensation on the left side of her face.  She has had some dizziness.  She  has had some visual changes secondary to cataracts.  As noted, she is on  Coumadin which has been on hold since Sunday.  She has a history of  arthritis mainly in her hands.  The remainder of the review of systems was  negative.   LABORATORY DATA:  Laboratory data are pending.   PHYSICAL EXAMINATION:  GENERAL:   Physical exam reveals a very pleasant 21-  year-old [place] American female in no acute distress.  VITAL SIGNS:  Blood pressure 132/65, pulse 72, respirations 18, temperature  97.9, oxygen saturation 100% on room air.  HEENT:  Unremarkable.  NECK:  Neck reveals a right carotid bruit.  LUNGS:  Lungs are clear, but decreased.  ABDOMEN:  Obese, soft and nontender.  EXTREMITIES:  Extremities reveal no edema.  Pulses are weak to absent.  SKIN:  Warm and dry.  NEUROLOGICAL:  Mental Status:  The patient is alert and oriented, and  follows commands.  Cranial nerves are grossly intact.  Sensation is intact  to light touch.  Motor strength is 5/5 throughout.  Cerebellar testing is  intact.  Reflexes are brisk in all areas.   IMPRESSION:  1.  Known severe left carotid disease with mild-to-moderate right carotid      disease.  2.  History of amaurosis fugax and questionable previous cerebrovascular      accident.  3.  Recent Coumadin therapy, currently on hold.  4.  MRI of the neck performed March 31, 2004, report not available.  5.  History of diabetes mellitus.  6.  History of hyperlipidemia.  7.  Questionable history of cerebrovascular accident.  8.  Hypertension.  9.  Coronary artery disease with a myocardial infarction with percutaneous      transluminal coronary angioplasty approximately 20 years ago.  10. Remote tobacco history.  11. Remote alcohol history.  12. Recent headaches and dizziness.  13. History of cataracts.   PLAN:  The patient is to undergo a cerebral angiogram to be performed by Dr.  Estanislado Pandy today to further evaluate her carotid artery disease as well as to  evaluate for other cerebrovascular disease with possibility of stenting in  the future.  Her Coumadin will be resumed this evening and will continue to  be followed by her neurologist, Dr. Doy Mince.      DR/MEDQ  D:  07/08/2004  T:  07/08/2004  Job:  [id number]  cc:  Michael L. Doy Mince, M.D.  [id number]  [facility] [address]  Fax: [phone]   Franklyn Lor  [address]  [place]   [address]  Fax: [phone]

## 2010-08-07 NOTE — Discharge Summary (Signed)
[name], [name]              ACCOUNT NO.:  [id number]   MEDICAL RECORD NO.:  [id number]          PATIENT TYPE:  INP   LOCATION:  [id number]                         FACILITY:  [facility]   PHYSICIAN:  Jacinta Shoe, P.A.DATE OF BIRTH:  1941/03/12   DATE OF ADMISSION:  01/06/2007  DATE OF DISCHARGE:  01/07/2007                               DISCHARGE SUMMARY   ADMISSION DIAGNOSIS:  Severe right internal carotid artery stenosis.   DISCHARGE/SECONDARY DIAGNOSES:  1. Severe right internal carotid artery stenosis, status post right      carotid endarterectomy.  2. Diabetes mellitus.  3. Hypertension.  4. Hypercholesterolemia.  5. Remote history of myocardial infarction.  6. He has no known drug allergies.  7. Ongoing tobacco abuse.   PROCEDURES:  January 06, 2007, right carotid endarterectomy with Dacron  patch  angioplasty by Dr. Deitra Mayo.   BRIEF HISTORY:  [name] is a 70 year old female who Dr. Scot Dock  originally saw back in September 2005 when she had an episode of  amaurosis fugax.  She underwent cerebral arteriogram at that time, which  showed only moderate right carotid stenosis and diffuse intracranial  disease on the left.  She was not a candidate for left carotid  endarterectomy at that time.  Stenosis on the right progressed greater  than 80%.  Given the essentially occluded internal carotid artery on the  left and greater than 80% right carotid stenosis, he recommended that  she undergo right carotid endarterectomy to reduce her risk for future  stroke.   HOSPITAL COURSE:  [name] was electively admitted to [facility] January 06, 2007.  She underwent the previously mentioned  procedure.  She was extubated, neurologically intact.  After a short  stay in recovery, she was transferred to step-down unit 3300, where she  remained until discharged.  In fact, she did undergo a tobacco cessation  consult during this hospitalization, but refused to talk  about it.   On postoperative day 1, [name] was hemodynamically stable.  Her  incision was healing well without signs of hematoma.  Neurologically,  she remained intact.  Her labs showed a hemoglobin of 10, hematocrit  around 30, white count of 6.3, sodium 139, potassium 4.1, BUN of 11,  creatinine 0.8, and blood glucose of 124.  By the morning, her Foley  catheter and arterial line had been discontinued.  Her diet was also  advanced.  She was mobilized and felt to meet criteria for discharge  later that day, January 07, 2007, postoperative day 1.  She was  discharged home in stable condition.   DISCHARGE MEDICATIONS:  1. Januvia 100 mg p.o. q. a.m..  2. Diovan/hydrochlorothiazide 320/12.5 mg daily.  3. Glipizide/metformin 5/500 mg b.i.d.  4. Enteric coated aspirin 325 mg daily.  5. Labetalol 300 mg b.i.d.  6. Caduet 10/10 mg q.h.s.  7. Digoxin 0.125 mg daily.  8. Clonidine 0.1 mg q.h.s.  9. Lovaza 1000 mg 2 tablets b.i.d.  10.Pletal 100 mg p.o. b.i.d.  11.Omega-3 supplement 1 gram b.i.d.  12.Tylox 1-2 tablets p.o. q.4 hours p.r.n. pain.   DISCHARGE INSTRUCTIONS:  She is to continue a diabetic-appropriate diet.  She will also clean her incision gently with soap and water.  She will  call if she develops fever greater than 101, or redness or drainage from  her incision site.  She will see Dr. Scot Dock in the vascular vein  specialist office in approximately 2 weeks.  Our office will contact her  regarding a specific appointment date and time.      Jacinta Shoe, P.A.     AWZ/MEDQ  D:  02/10/2007  T:  02/11/2007  Job:  [id number]   cc:   Legrand Como L. Doy Mince, M.D.

## 2010-12-09 ENCOUNTER — Encounter: Payer: Self-pay | Admitting: Physician Assistant

## 2010-12-30 LAB — URINALYSIS, ROUTINE W REFLEX MICROSCOPIC
Glucose, UA: NEGATIVE
Hgb urine dipstick: NEGATIVE
Protein, ur: NEGATIVE
Specific Gravity, Urine: 1.009
pH: 7

## 2010-12-30 LAB — URINE MICROSCOPIC-ADD ON

## 2010-12-30 LAB — CBC
Hemoglobin: 10.3 — ABNORMAL LOW
Hemoglobin: 13.2
MCHC: 33.5
RBC: 3.54 — ABNORMAL LOW
RBC: 4.5
RDW: 15 — ABNORMAL HIGH
RDW: 15.3 — ABNORMAL HIGH

## 2010-12-30 LAB — COMPREHENSIVE METABOLIC PANEL
ALT: 19
AST: 17
Alkaline Phosphatase: 58
Calcium: 9.7
GFR calc Af Amer: >60
Glucose, Bld: 123 — ABNORMAL HIGH
Potassium: 3.8
Sodium: 139
Total Protein: 6.8

## 2010-12-30 LAB — BASIC METABOLIC PANEL
Chloride: 112
GFR calc non Af Amer: >60
Glucose, Bld: 124 — ABNORMAL HIGH
Potassium: 4.1
Sodium: 139

## 2010-12-30 LAB — HEMOGLOBIN A1C: Hgb A1c MFr Bld: 5.9

## 2010-12-30 LAB — TYPE AND SCREEN
ABO/RH(D): A POS
Antibody Screen: NEGATIVE

## 2011-02-02 ENCOUNTER — Encounter: Payer: Self-pay | Admitting: Physician Assistant

## 2011-02-03 ENCOUNTER — Ambulatory Visit (INDEPENDENT_AMBULATORY_CARE_PROVIDER_SITE_OTHER): Payer: Medicare Other | Admitting: Physician Assistant

## 2011-02-03 ENCOUNTER — Other Ambulatory Visit (INDEPENDENT_AMBULATORY_CARE_PROVIDER_SITE_OTHER): Payer: Medicare Other | Admitting: Vascular Surgery

## 2011-02-03 ENCOUNTER — Encounter: Payer: Self-pay | Admitting: Physician Assistant

## 2011-02-03 VITALS — BP 149/49 | HR 77 | Resp 20 | Ht 61.0 in | Wt 121.0 lb

## 2011-02-03 DIAGNOSIS — Z48812 Encounter for surgical aftercare following surgery on the circulatory system: Secondary | ICD-10-CM

## 2011-02-03 DIAGNOSIS — I6529 Occlusion and stenosis of unspecified carotid artery: Secondary | ICD-10-CM

## 2011-02-03 NOTE — Progress Notes (Signed)
[facility] [place] HISTORY AND PHYSICAL   CC: f/u carotid duplex scan Alison Caprice, MD  HPI: This is a 70 y.o. female here for yearly carotid duplex scan follow up.  She is doing quite well and denies any symptoms of stroke such as amaurosis fugax, paresthesias or hemiparesis, slurred speech.    Past Medical History  Diagnosis Date  . Diabetes mellitus   . Hypertension   . Myocardial infarction   . Arthritis   . Hyperlipidemia   . CAD (coronary artery disease)   . Carotid artery occlusion    Past Surgical History  Procedure Date  . Carotid endarterectomy 01/06/2007    right - with Dacron patch angioplasty    No Known Allergies  Current Outpatient Prescriptions  Medication Sig Dispense Refill  . amLODipine (NORVASC) 10 MG tablet Take 10 mg by mouth daily.        Marland Kitchen aspirin EC 81 MG tablet Take 81 mg by mouth daily.        . cilostazol (PLETAL) 100 MG tablet Take 100 mg by mouth daily.        . cloNIDine (CATAPRES) 0.1 MG tablet Take 0.1 mg by mouth 2 (two) times daily.        Marland Kitchen glipiZIDE-metformin (METAGLIP) 5-500 MG per tablet Take 1 tablet by mouth 2 (two) times daily before a meal.        . labetalol (NORMODYNE) 200 MG tablet Take 200 mg by mouth 2 (two) times daily.        . simvastatin (ZOCOR) 20 MG tablet Take 20 mg by mouth at bedtime.        . sitaGLIPtin (JANUVIA) 100 MG tablet Take 100 mg by mouth daily.        . valsartan-hydrochlorothiazide (DIOVAN-HCT) 320-12.5 MG per tablet Take 1 tablet by mouth daily.          Family History  Problem Relation Age of Onset  . Heart disease Mother   . Diabetes Mother   . Heart disease Brother   . Diabetes Brother     History   Social History  . Marital Status: Widowed    Spouse Name: N/A    Number of Children: N/A  . Years of Education: N/A   Occupational History  . Not on file.   Social History Main Topics  . Smoking status: Former Smoker -- 1.0 packs/day    Types: Cigarettes    Quit  date: 03/23/1987  . Smokeless tobacco: Never Used  . Alcohol Use: No  . Drug Use: Not on file  . Sexually Active: Not on file   Other Topics Concern  . Not on file   Social History Narrative  . No narrative on file     ROS: [x]  Positive   [ ]  Negative   [x]  All sytems reviewed and are negative  General: [ ]  Weight loss, [ ]  Fever, [ ]  chills Neurologic: [ ]  Dizziness, [ ]  Blackouts, [ ]  Seizure [ ]  Stroke, [ ]  "Mini stroke", [ ]  Slurred speech, [ ]  Temporary blindness; [ ]  weakness in arms or legs Cardiac: [ ]  Chest pain/pressure, [ ]  Shortness of breath at rest [ ]  Shortness of breath with exertion, [ ]  Atrial fibrillation or irregular heartbeat Vascular: [ ]  Pain in legs with walking, [ ]  Pain in legs at rest, [ ]  Pain in legs at night,  [ ]  Non-healing ulcer, [ ]  Blood clot in vein/DVT,   Pulmonary: [ ]   Home oxygen, [ ]  Productive cough, [ ]  hemoptysis, [ ]  Asthma,  [ ]  Wheezing Musculoskeletal:  [ ]  Arthritis, [ ] ; [ ]  Joint pain Hematologic: [ ]  Easy Bruising, [ ]  Anemia; [ ]  Hepatitis Gastrointestinal: [ ]  Melena, [ ]  Hx of stomach ulcers;  [ ]  Gastroesophageal Reflux/heartburn, [ ]  Trouble swallowing Urinary: [ ]  chronic Kidney disease, [ ]  on HD - [ ]  MWF or [ ]  TTHS, [ ]  Burning with urination, [ ]  Difficulty urinating; [ ]  Hematuria Skin: [ ]  Rashes, [ ]  Wounds Psychological: [ ]  Anxiety, [ ]  Depression   PHYSICAL EXAMINATION:  Filed Vitals:   02/03/11 1418  BP: 149/49  Pulse: 77  Resp: 20   Body mass index is 22.86 kg/(m^2).  General:  WDWN in NAD Gait: Normal HENT: WNL Eyes: PERRL Pulmonary: normal non-labored breathing , without Rales, rhonchi,  wheezing Cardiac: RRR, without  Murmurs, rubs or gallops; Bilateral carotid bruits Abdomen: soft, NT, no masses Skin: no rashes, ulcers noted Extremities without ischemic changes, no Gangrene , no cellulitis; no open wounds;  Musculoskeletal: no muscle wasting or atrophy  Neurologic: A&O X 3; Appropriate Affect  ; SENSATION: normal; MOTOR FUNCTION:  moving all extremities equally. Speech is fluent/normal  Non-Invasive Vascular Imaging: RICA patent through endarterectomy site.  Elevated velocities present suggesting 60-79% stenosis (high end of range distal to the endarterectomy site with significant vessel tortuosity present, which may overestimate disease.  BECA stenosis.  LICA minimal dampened flow present with hx of known occlusion.  Right vertebral abnormal blunted flow suggestive of stenosis vs. Occlusion.  Minimal change since previous study on 02/12/11  ASSESSMENT: 70 y.o. female who is s/p right CEA 01/06/07 by Dr. Scot Dock.  She is doing well and is without symptoms of stroke, however, she does have bilateral carotid bruits  PLAN: Dr. Scot Dock reviewed her carotid duplex scan and we will have her return in 6 months with a f/u carotid duplex scan and visit with Dr. Scot Dock.   She knows that if she has any problems, she is to contact us or go to the hospital.  I discussed the importance of smoking cessation briefly with the pt.  Evorn Gong, PA-C [facility] [phone]  Clinic MD Scot Dock

## 2011-02-16 NOTE — Procedures (Unsigned)
CAROTID DUPLEX EXAM  INDICATION:  Follow up carotid stenosis.  HISTORY: Diabetes:  Yes. Cardiac:  No. Hypertension:  Yes. Smoking:  Previous. Previous Surgery:  Right carotid endarterectomy on 01/06/2007. CV History:  Asymptomatic. Amaurosis Fugax No, Paresthesias No, hemiparesis No.                                      RIGHT             LEFT Brachial systolic pressure:         122               114 Brachial Doppler waveforms:         WNL               WNL Vertebral direction of flow:        Abnormal, blunted Antegrade DUPLEX VELOCITIES (cm/sec) CCA peak systolic                   128               87 ECA peak systolic                   180               [id number] ICA peak systolic                   135 - P/245 - M   45/known occlusion ICA end diastolic                   43 - P/109 - M    7/known occlusion PLAQUE MORPHOLOGY:                  Not visualized    Calcified PLAQUE AMOUNT:                      Not visualized    Occlusive PLAQUE LOCATION:                    Not visualized    CCA/ICA  IMPRESSION: 1. Right internal carotid artery is patent through the     endarterectomized segment, elevated velocities present suggesting     60% to 79% stenosis (high end of range), distal to     endarterectomized endarterectomy site with significant vessel     tortuosity present, which may overestimate disease. 2. Bilateral external carotid artery stenosis present. 3. Left internal carotid artery presents with minimal dampened flow     present with history of known occlusion. 4. Right vertebral artery is abnormal and blunted, which is suggestive     of stenosis versus occlusion. 5. Minimal change present since the previous study on 02/11/2010.  ___________________________________________ Rosetta Posner, M.D.  SH/MEDQ  D:  02/03/2011  T:  02/03/2011  Job:  [id number]

## 2011-08-04 ENCOUNTER — Other Ambulatory Visit: Payer: Self-pay | Admitting: *Deleted

## 2011-08-04 DIAGNOSIS — Z48812 Encounter for surgical aftercare following surgery on the circulatory system: Secondary | ICD-10-CM

## 2011-08-04 DIAGNOSIS — I6529 Occlusion and stenosis of unspecified carotid artery: Secondary | ICD-10-CM

## 2011-08-10 ENCOUNTER — Encounter: Payer: Self-pay | Admitting: Neurosurgery

## 2011-08-11 ENCOUNTER — Encounter: Payer: Self-pay | Admitting: Neurosurgery

## 2011-08-11 ENCOUNTER — Other Ambulatory Visit (INDEPENDENT_AMBULATORY_CARE_PROVIDER_SITE_OTHER): Payer: Medicare Other | Admitting: *Deleted

## 2011-08-11 ENCOUNTER — Ambulatory Visit (INDEPENDENT_AMBULATORY_CARE_PROVIDER_SITE_OTHER): Payer: Medicare Other | Admitting: Neurosurgery

## 2011-08-11 VITALS — BP 116/57 | HR 74 | Temp 98.2°F | Resp 16 | Ht 61.0 in | Wt 121.2 lb

## 2011-08-11 DIAGNOSIS — I6529 Occlusion and stenosis of unspecified carotid artery: Secondary | ICD-10-CM

## 2011-08-11 DIAGNOSIS — N186 End stage renal disease: Secondary | ICD-10-CM | POA: Insufficient documentation

## 2011-08-11 DIAGNOSIS — Z48812 Encounter for surgical aftercare following surgery on the circulatory system: Secondary | ICD-10-CM

## 2011-08-11 NOTE — Progress Notes (Signed)
Addended by: Mena Goes on: 08/11/2011 12:39 PM   Modules accepted: Orders

## 2011-08-11 NOTE — Progress Notes (Signed)
[facility] [place] HISTORY AND PHYSICAL   CC: Six-month carotid surveillance Referring Physician: Scot Dock  History of Present Illness: 71 year old female patient of Dr. Scot Dock with a history of a right CEA in October 2008, since that time she's been asymptomatic. She has a total occlusion on the left. The patient reports no signs or symptoms of CVA, TIA, amaurosis fugax, or word finding difficulty. Patient does state she is having some difficulties with her diabetes but her primary care doctor is managing this.  Past Medical History  Diagnosis Date  . Diabetes mellitus   . Hypertension   . Myocardial infarction   . Arthritis   . Hyperlipidemia   . CAD (coronary artery disease)   . Carotid artery occlusion     ROS: [x]  Positive   [ ]  Denies    General: [ ]  Weight loss, [ ]  Fever, [ ]  chills Neurologic: [ ]  Dizziness, [ ]  Blackouts, [ ]  Seizure [ ]  Stroke, [ ]  "Mini stroke", [ ]  Slurred speech, [ ]  Temporary blindness; [ ]  weakness in arms or legs, [ ]  Hoarseness Cardiac: [ ]  Chest pain/pressure, [ ]  Shortness of breath at rest [ ]  Shortness of breath with exertion, [ ]  Atrial fibrillation or irregular heartbeat Vascular: [ ]  Pain in legs with walking, [ ]  Pain in legs at rest, [ ]  Pain in legs at night,  [ ]  Non-healing ulcer, [ ]  Blood clot in vein/DVT,   Pulmonary: [ ]  Home oxygen, [ ]  Productive cough, [ ]  Coughing up blood, [ ]  Asthma,  [ ]  Wheezing Musculoskeletal:  [ ]  Arthritis, [ ]  Low back pain, [ ]  Joint pain Hematologic: [ ]  Easy Bruising, [ ]  Anemia; [ ]  Hepatitis Gastrointestinal: [ ]  Blood in stool, [ ]  Gastroesophageal Reflux/heartburn, [ ]  Trouble swallowing Urinary: [ ]  chronic Kidney disease, [ ]  on HD - [ ]  MWF or [ ]  TTHS, [ ]  Burning with urination, [ ]  Difficulty urinating Skin: [ ]  Rashes, [ ]  Wounds Psychological: [ ]  Anxiety, [ ]  Depression   Social History History  Substance Use Topics  . Smoking status: Former Smoker -- 1.0  packs/day    Types: Cigarettes    Quit date: 03/23/1987  . Smokeless tobacco: Never Used  . Alcohol Use: No    Family History Family History  Problem Relation Age of Onset  . Heart disease Mother   . Diabetes Mother   . Heart disease Brother   . Diabetes Brother     No Known Allergies  Current Outpatient Prescriptions  Medication Sig Dispense Refill  . amLODipine (NORVASC) 10 MG tablet Take 10 mg by mouth daily.        Marland Kitchen aspirin EC 81 MG tablet Take 81 mg by mouth daily.        . cholecalciferol (VITAMIN D) 1000 UNITS tablet Take 2,000 Units by mouth daily.      . cilostazol (PLETAL) 100 MG tablet Take 100 mg by mouth daily.        . cloNIDine (CATAPRES) 0.1 MG tablet Take 0.1 mg by mouth 2 (two) times daily.        Marland Kitchen glipiZIDE (GLUCOTROL) 5 MG tablet Take 5 mg by mouth daily. Take one half tab at noon      . glipiZIDE-metformin (METAGLIP) 5-500 MG per tablet Take 1 tablet by mouth 2 (two) times daily before a meal.        . labetalol (NORMODYNE) 200 MG tablet Take 200 mg by  mouth 2 (two) times daily.        . simvastatin (ZOCOR) 20 MG tablet Take 20 mg by mouth at bedtime.        . valsartan-hydrochlorothiazide (DIOVAN-HCT) 320-12.5 MG per tablet Take 1 tablet by mouth daily.        Marland Kitchen loratadine (CLARITIN) 10 MG tablet Take 10 mg by mouth Daily.      . sitaGLIPtin (JANUVIA) 100 MG tablet Take 100 mg by mouth daily.          Physical Examination  Filed Vitals:   08/11/11 0949  BP: 116/57  Pulse: 74  Temp: 98.2 F (36.8 C)  Resp: 16    Body mass index is 22.90 kg/(m^2).  General:  WDWN in NAD Gait: Normal HEENT: WNL Eyes: Pupils equal Pulmonary: normal non-labored breathing , without Rales, rhonchi,  wheezing Cardiac: RRR, without  Murmurs, rubs or gallops; Abdomen: soft, NT, no masses Skin: no rashes, ulcers noted  Vascular Exam Pulses: 2+ radial pulses bilaterally Carotid bruits: Left-sided occlusion, right side has a pulse to auscultation no bruits  heard Extremities without ischemic changes, no Gangrene , no cellulitis; no open wounds;  Musculoskeletal: no muscle wasting or atrophy   Neurologic: A&O X 3; Appropriate Affect ; SENSATION: normal; MOTOR FUNCTION:  moving all extremities equally. Speech is fluent/normal  Non-Invasive Vascular Imaging CAROTID DUPLEX 08/11/2011  Right ICA 40 - 59 % stenosis Left ICA 0ccluded stenosis   ASSESSMENT/PLAN: Asymptomatic patient with total left occlusion, right carotid is unchanged from previous it less than 59%. She does have a kink in the distal end of the patch but no disease is observed. Plan will be for return in 6 months to repeat the carotid duplex on the right her questions were encouraged and answered, she is in agreement with this plan.  Beatris Ship ANP   Clinic MD: Scot Dock

## 2011-08-18 NOTE — Procedures (Unsigned)
CAROTID DUPLEX EXAM  INDICATION:  Followup right CEA; known left ICA occlusion.  HISTORY: Diabetes:  Yes Cardiac:  No Hypertension:  Yes Smoking:  Previous Previous Surgery:  Right CEA 01/06/2007 CV History: Amaurosis Fugax No, Paresthesias No, Hemiparesis No                                      RIGHT             LEFT Brachial systolic pressure: Brachial Doppler waveforms: Vertebral direction of flow:        Antegrade DUPLEX VELOCITIES (cm/sec) CCA peak systolic                   [id number] ECA peak systolic                   [id number] ICA peak systolic                   [id number] ICA end diastolic                   91 PLAQUE MORPHOLOGY:                  N/A PLAQUE AMOUNT:                      None PLAQUE LOCATION:                    N/A  IMPRESSION: 1. Widely patent right CEA with elevated velocities at a kink distal     to the patch.  No disease is observed. 2. Mild ECA stenosis present. 3. Antegrade right vertebral artery. 4. Right subclavian artery is within normal limits. 5. Left side not evaluated due to known occlusion.  ___________________________________________ [name]. Scot Dock, M.D.  LT/MEDQ  D:  08/11/2011  T:  08/11/2011  Job:  [id number]

## 2012-02-11 ENCOUNTER — Ambulatory Visit: Payer: Medicare Other | Admitting: Neurosurgery

## 2012-02-11 ENCOUNTER — Other Ambulatory Visit: Payer: Medicare Other

## 2012-02-23 ENCOUNTER — Encounter: Payer: Self-pay | Admitting: Neurosurgery

## 2012-02-24 ENCOUNTER — Other Ambulatory Visit (INDEPENDENT_AMBULATORY_CARE_PROVIDER_SITE_OTHER): Payer: Medicare Other | Admitting: *Deleted

## 2012-02-24 ENCOUNTER — Encounter: Payer: Self-pay | Admitting: Neurosurgery

## 2012-02-24 ENCOUNTER — Ambulatory Visit (INDEPENDENT_AMBULATORY_CARE_PROVIDER_SITE_OTHER): Payer: Medicare Other | Admitting: Neurosurgery

## 2012-02-24 VITALS — BP 122/68 | HR 82 | Temp 98.9°F

## 2012-02-24 DIAGNOSIS — I6529 Occlusion and stenosis of unspecified carotid artery: Secondary | ICD-10-CM

## 2012-02-24 DIAGNOSIS — Z48812 Encounter for surgical aftercare following surgery on the circulatory system: Secondary | ICD-10-CM

## 2012-02-24 NOTE — Progress Notes (Signed)
[facility] [place] Carotid Office Note  CC: Carotid surveillance Referring Physician: Scot Dock  History of Present Illness: 72 year old female patient of Dr. Scot Dock with a known left ICA occlusion and a right carotid endarterectomy in 2008. The patient denies any signs or symptoms of CVA, TIA, amaurosis fugax or any neural deficit. The patient denies any new medical diagnoses or recent surgery.  Past Medical History  Diagnosis Date  . Diabetes mellitus   . Hypertension   . Myocardial infarction   . Arthritis   . Hyperlipidemia   . CAD (coronary artery disease)   . Carotid artery occlusion     ROS: [x]  Positive   [ ]  Denies    General: [ ]  Weight loss, [ ]  Fever, [ ]  chills Neurologic: [ ]  Dizziness, [ ]  Blackouts, [ ]  Seizure [ ]  Stroke, [ ]  "Mini stroke", [ ]  Slurred speech, [ ]  Temporary blindness; [ ]  weakness in arms or legs, [ ]  Hoarseness Cardiac: [ ]  Chest pain/pressure, [ ]  Shortness of breath at rest [ ]  Shortness of breath with exertion, [ ]  Atrial fibrillation or irregular heartbeat Vascular: [ ]  Pain in legs with walking, [ ]  Pain in legs at rest, [ ]  Pain in legs at night,  [ ]  Non-healing ulcer, [ ]  Blood clot in vein/DVT,   Pulmonary: [ ]  Home oxygen, [ ]  Productive cough, [ ]  Coughing up blood, [ ]  Asthma,  [ ]  Wheezing Musculoskeletal:  [ ]  Arthritis, [ ]  Low back pain, [ ]  Joint pain Hematologic: [ ]  Easy Bruising, [ ]  Anemia; [ ]  Hepatitis Gastrointestinal: [ ]  Blood in stool, [ ]  Gastroesophageal Reflux/heartburn, [ ]  Trouble swallowing Urinary: [ ]  chronic Kidney disease, [ ]  on HD - [ ]  MWF or [ ]  TTHS, [ ]  Burning with urination, [ ]  Difficulty urinating Skin: [ ]  Rashes, [ ]  Wounds Psychological: [ ]  Anxiety, [ ]  Depression   Social History History  Substance Use Topics  . Smoking status: Former Smoker -- 1.0 packs/day    Types: Cigarettes    Quit date: 03/23/1987  . Smokeless tobacco: Never Used  . Alcohol Use: No     Family History Family History  Problem Relation Age of Onset  . Heart disease Mother   . Diabetes Mother   . Heart disease Brother   . Diabetes Brother     No Known Allergies  Current Outpatient Prescriptions  Medication Sig Dispense Refill  . amLODipine (NORVASC) 10 MG tablet Take 10 mg by mouth daily.        Marland Kitchen aspirin EC 81 MG tablet Take 81 mg by mouth daily.        . cholecalciferol (VITAMIN D) 1000 UNITS tablet Take 2,000 Units by mouth daily.      . cilostazol (PLETAL) 100 MG tablet Take 100 mg by mouth daily.        . cloNIDine (CATAPRES) 0.1 MG tablet Take 0.1 mg by mouth 2 (two) times daily.        Marland Kitchen glipiZIDE (GLUCOTROL) 5 MG tablet Take 5 mg by mouth daily. Take one half tab at noon      . glipiZIDE-metformin (METAGLIP) 5-500 MG per tablet Take 1 tablet by mouth 2 (two) times daily before a meal.        . labetalol (NORMODYNE) 200 MG tablet Take 200 mg by mouth 2 (two) times daily.        Marland Kitchen loratadine (CLARITIN) 10 MG tablet Take 10 mg by  mouth Daily.      . simvastatin (ZOCOR) 20 MG tablet Take 20 mg by mouth at bedtime.        . sitaGLIPtin (JANUVIA) 100 MG tablet Take 100 mg by mouth daily.        . valsartan-hydrochlorothiazide (DIOVAN-HCT) 320-12.5 MG per tablet Take 1 tablet by mouth daily.          Physical Examination  Filed Vitals:   02/24/12 1113  BP: 122/68  Pulse: 82  Temp: 98.9 F (37.2 C)    There is no height or weight on file to calculate BMI.  General:  WDWN in NAD Gait: Normal HEENT: WNL Eyes: Pupils equal Pulmonary: normal non-labored breathing , without Rales, rhonchi,  wheezing Cardiac: RRR, without  Murmurs, rubs or gallops; Abdomen: soft, NT, no masses Skin: no rashes, ulcers noted  Vascular Exam Pulses: 3+ radial pulses bilaterally Carotid bruits: Carotid pulses heard on the right with a known left occlusion Extremities without ischemic changes, no Gangrene , no cellulitis; no open wounds;  Musculoskeletal: no muscle wasting  or atrophy   Neurologic: A&O X 3; Appropriate Affect ; SENSATION: normal; MOTOR FUNCTION:  moving all extremities equally. Speech is fluent/normal  Non-Invasive Vascular Imaging CAROTID DUPLEX 02/24/2012  Right ICA 0 - 19% stenosis Left ICA 0ccluded stenosis   ASSESSMENT/PLAN: Asymptomatic patient that will followup in one year with repeat carotid duplex. The patient's questions were encouraged and answered, she is in agreement with this plan.  Beatris Ship ANP   Clinic MD: Oneida Alar

## 2012-02-24 NOTE — Addendum Note (Signed)
Addended by: Peter Minium K on: 02/24/2012 12:12 PM   Modules accepted: Orders

## 2013-02-20 ENCOUNTER — Encounter: Payer: Self-pay | Admitting: Family

## 2013-02-21 ENCOUNTER — Ambulatory Visit: Payer: Medicare Other | Admitting: Family

## 2013-02-21 ENCOUNTER — Other Ambulatory Visit (HOSPITAL_COMMUNITY): Payer: Medicare Other

## 2013-02-21 ENCOUNTER — Inpatient Hospital Stay (HOSPITAL_COMMUNITY): Admission: RE | Admit: 2013-02-21 | Payer: Medicare Other | Source: Ambulatory Visit

## 2018-11-16 DIAGNOSIS — I110 Hypertensive heart disease with heart failure: Secondary | ICD-10-CM | POA: Diagnosis not present

## 2018-11-16 DIAGNOSIS — I340 Nonrheumatic mitral (valve) insufficiency: Secondary | ICD-10-CM | POA: Diagnosis not present

## 2018-11-16 DIAGNOSIS — R0602 Shortness of breath: Secondary | ICD-10-CM | POA: Diagnosis not present

## 2018-11-16 DIAGNOSIS — I2510 Atherosclerotic heart disease of native coronary artery without angina pectoris: Secondary | ICD-10-CM | POA: Diagnosis not present

## 2018-11-16 DIAGNOSIS — I361 Nonrheumatic tricuspid (valve) insufficiency: Secondary | ICD-10-CM

## 2018-11-16 DIAGNOSIS — I5023 Acute on chronic systolic (congestive) heart failure: Secondary | ICD-10-CM | POA: Diagnosis not present

## 2018-11-17 DIAGNOSIS — I5023 Acute on chronic systolic (congestive) heart failure: Secondary | ICD-10-CM | POA: Diagnosis not present

## 2018-11-17 DIAGNOSIS — I110 Hypertensive heart disease with heart failure: Secondary | ICD-10-CM | POA: Diagnosis not present

## 2018-11-17 DIAGNOSIS — I2510 Atherosclerotic heart disease of native coronary artery without angina pectoris: Secondary | ICD-10-CM | POA: Diagnosis not present

## 2018-11-17 DIAGNOSIS — R0602 Shortness of breath: Secondary | ICD-10-CM | POA: Diagnosis not present

## 2018-11-20 ENCOUNTER — Inpatient Hospital Stay (HOSPITAL_COMMUNITY)
Admission: EM | Admit: 2018-11-20 | Discharge: 2018-12-02 | DRG: 023 | Disposition: A | Discharge status: Home Health | Payer: Medicare Other | Attending: Neurology | Admitting: Neurology

## 2018-11-20 ENCOUNTER — Inpatient Hospital Stay (HOSPITAL_COMMUNITY): Payer: Medicare Other | Admitting: Anesthesiology

## 2018-11-20 ENCOUNTER — Emergency Department (HOSPITAL_COMMUNITY): Payer: Medicare Other

## 2018-11-20 ENCOUNTER — Inpatient Hospital Stay (HOSPITAL_COMMUNITY): Payer: Medicare Other

## 2018-11-20 ENCOUNTER — Encounter (HOSPITAL_COMMUNITY): Payer: Self-pay

## 2018-11-20 ENCOUNTER — Encounter (HOSPITAL_COMMUNITY)
Admission: EM | Disposition: A | Discharge status: Home Health | Payer: Self-pay | Source: Home / Self Care | Attending: Neurology

## 2018-11-20 DIAGNOSIS — I480 Paroxysmal atrial fibrillation: Secondary | ICD-10-CM | POA: Diagnosis not present

## 2018-11-20 DIAGNOSIS — R414 Neurologic neglect syndrome: Secondary | ICD-10-CM | POA: Diagnosis present

## 2018-11-20 DIAGNOSIS — M1990 Unspecified osteoarthritis, unspecified site: Secondary | ICD-10-CM | POA: Diagnosis present

## 2018-11-20 DIAGNOSIS — E876 Hypokalemia: Secondary | ICD-10-CM | POA: Diagnosis not present

## 2018-11-20 DIAGNOSIS — Z87891 Personal history of nicotine dependence: Secondary | ICD-10-CM

## 2018-11-20 DIAGNOSIS — E785 Hyperlipidemia, unspecified: Secondary | ICD-10-CM | POA: Diagnosis present

## 2018-11-20 DIAGNOSIS — Z833 Family history of diabetes mellitus: Secondary | ICD-10-CM

## 2018-11-20 DIAGNOSIS — R011 Cardiac murmur, unspecified: Secondary | ICD-10-CM | POA: Diagnosis not present

## 2018-11-20 DIAGNOSIS — R338 Other retention of urine: Secondary | ICD-10-CM | POA: Diagnosis not present

## 2018-11-20 DIAGNOSIS — I2510 Atherosclerotic heart disease of native coronary artery without angina pectoris: Secondary | ICD-10-CM | POA: Diagnosis present

## 2018-11-20 DIAGNOSIS — I519 Heart disease, unspecified: Secondary | ICD-10-CM | POA: Diagnosis not present

## 2018-11-20 DIAGNOSIS — Z8673 Personal history of transient ischemic attack (TIA), and cerebral infarction without residual deficits: Secondary | ICD-10-CM

## 2018-11-20 DIAGNOSIS — R339 Retention of urine, unspecified: Secondary | ICD-10-CM | POA: Diagnosis not present

## 2018-11-20 DIAGNOSIS — H518 Other specified disorders of binocular movement: Secondary | ICD-10-CM | POA: Diagnosis present

## 2018-11-20 DIAGNOSIS — J9601 Acute respiratory failure with hypoxia: Secondary | ICD-10-CM | POA: Diagnosis not present

## 2018-11-20 DIAGNOSIS — I5023 Acute on chronic systolic (congestive) heart failure: Secondary | ICD-10-CM | POA: Diagnosis not present

## 2018-11-20 DIAGNOSIS — R471 Dysarthria and anarthria: Secondary | ICD-10-CM | POA: Diagnosis present

## 2018-11-20 DIAGNOSIS — R1312 Dysphagia, oropharyngeal phase: Secondary | ICD-10-CM | POA: Diagnosis not present

## 2018-11-20 DIAGNOSIS — I63411 Cerebral infarction due to embolism of right middle cerebral artery: Secondary | ICD-10-CM | POA: Diagnosis not present

## 2018-11-20 DIAGNOSIS — R29722 NIHSS score 22: Secondary | ICD-10-CM | POA: Diagnosis present

## 2018-11-20 DIAGNOSIS — R1310 Dysphagia, unspecified: Secondary | ICD-10-CM | POA: Diagnosis present

## 2018-11-20 DIAGNOSIS — J9690 Respiratory failure, unspecified, unspecified whether with hypoxia or hypercapnia: Secondary | ICD-10-CM

## 2018-11-20 DIAGNOSIS — I639 Cerebral infarction, unspecified: Secondary | ICD-10-CM

## 2018-11-20 DIAGNOSIS — E119 Type 2 diabetes mellitus without complications: Secondary | ICD-10-CM | POA: Diagnosis not present

## 2018-11-20 DIAGNOSIS — I110 Hypertensive heart disease with heart failure: Secondary | ICD-10-CM | POA: Diagnosis present

## 2018-11-20 DIAGNOSIS — Z7982 Long term (current) use of aspirin: Secondary | ICD-10-CM | POA: Diagnosis not present

## 2018-11-20 DIAGNOSIS — I10 Essential (primary) hypertension: Secondary | ICD-10-CM | POA: Diagnosis not present

## 2018-11-20 DIAGNOSIS — I471 Supraventricular tachycardia: Secondary | ICD-10-CM | POA: Diagnosis not present

## 2018-11-20 DIAGNOSIS — K589 Irritable bowel syndrome without diarrhea: Secondary | ICD-10-CM

## 2018-11-20 DIAGNOSIS — R54 Age-related physical debility: Secondary | ICD-10-CM | POA: Diagnosis present

## 2018-11-20 DIAGNOSIS — Z4659 Encounter for fitting and adjustment of other gastrointestinal appliance and device: Secondary | ICD-10-CM

## 2018-11-20 DIAGNOSIS — I6523 Occlusion and stenosis of bilateral carotid arteries: Secondary | ICD-10-CM

## 2018-11-20 DIAGNOSIS — Z7984 Long term (current) use of oral hypoglycemic drugs: Secondary | ICD-10-CM | POA: Diagnosis not present

## 2018-11-20 DIAGNOSIS — I160 Hypertensive urgency: Secondary | ICD-10-CM | POA: Diagnosis not present

## 2018-11-20 DIAGNOSIS — E43 Unspecified severe protein-calorie malnutrition: Secondary | ICD-10-CM | POA: Diagnosis not present

## 2018-11-20 DIAGNOSIS — I429 Cardiomyopathy, unspecified: Secondary | ICD-10-CM

## 2018-11-20 DIAGNOSIS — I4891 Unspecified atrial fibrillation: Secondary | ICD-10-CM | POA: Diagnosis not present

## 2018-11-20 DIAGNOSIS — G936 Cerebral edema: Secondary | ICD-10-CM | POA: Diagnosis not present

## 2018-11-20 DIAGNOSIS — Z8249 Family history of ischemic heart disease and other diseases of the circulatory system: Secondary | ICD-10-CM | POA: Diagnosis not present

## 2018-11-20 DIAGNOSIS — I5022 Chronic systolic (congestive) heart failure: Secondary | ICD-10-CM | POA: Diagnosis not present

## 2018-11-20 DIAGNOSIS — Z681 Body mass index (BMI) 19 or less, adult: Secondary | ICD-10-CM | POA: Diagnosis not present

## 2018-11-20 DIAGNOSIS — I420 Dilated cardiomyopathy: Secondary | ICD-10-CM | POA: Diagnosis present

## 2018-11-20 DIAGNOSIS — I255 Ischemic cardiomyopathy: Secondary | ICD-10-CM | POA: Diagnosis not present

## 2018-11-20 DIAGNOSIS — I63311 Cerebral infarction due to thrombosis of right middle cerebral artery: Secondary | ICD-10-CM | POA: Diagnosis not present

## 2018-11-20 DIAGNOSIS — G8194 Hemiplegia, unspecified affecting left nondominant side: Secondary | ICD-10-CM | POA: Diagnosis not present

## 2018-11-20 DIAGNOSIS — I340 Nonrheumatic mitral (valve) insufficiency: Secondary | ICD-10-CM | POA: Diagnosis not present

## 2018-11-20 DIAGNOSIS — I6601 Occlusion and stenosis of right middle cerebral artery: Secondary | ICD-10-CM | POA: Diagnosis not present

## 2018-11-20 DIAGNOSIS — E1122 Type 2 diabetes mellitus with diabetic chronic kidney disease: Secondary | ICD-10-CM | POA: Diagnosis present

## 2018-11-20 DIAGNOSIS — G9340 Encephalopathy, unspecified: Secondary | ICD-10-CM | POA: Diagnosis present

## 2018-11-20 DIAGNOSIS — Z20828 Contact with and (suspected) exposure to other viral communicable diseases: Secondary | ICD-10-CM | POA: Diagnosis present

## 2018-11-20 DIAGNOSIS — H5347 Heteronymous bilateral field defects: Secondary | ICD-10-CM | POA: Diagnosis present

## 2018-11-20 DIAGNOSIS — R0602 Shortness of breath: Secondary | ICD-10-CM

## 2018-11-20 DIAGNOSIS — R29810 Facial weakness: Secondary | ICD-10-CM | POA: Diagnosis present

## 2018-11-20 DIAGNOSIS — D62 Acute posthemorrhagic anemia: Secondary | ICD-10-CM | POA: Diagnosis not present

## 2018-11-20 DIAGNOSIS — I252 Old myocardial infarction: Secondary | ICD-10-CM

## 2018-11-20 HISTORY — PX: IR PERCUTANEOUS ART THROMBECTOMY/INFUSION INTRACRANIAL INC DIAG ANGIO: IMG6087

## 2018-11-20 HISTORY — PX: RADIOLOGY WITH ANESTHESIA: SHX6223

## 2018-11-20 HISTORY — PX: IR CT HEAD LTD: IMG2386

## 2018-11-20 LAB — COMPREHENSIVE METABOLIC PANEL
ALT: 32 U/L (ref 0–44)
AST: 19 U/L (ref 15–41)
Albumin: 3.8 g/dL (ref 3.5–5.0)
Alkaline Phosphatase: 66 U/L (ref 38–126)
Anion gap: 14 (ref 5–15)
BUN: 20 mg/dL (ref 8–23)
CO2: 20 mmol/L — ABNORMAL LOW (ref 22–32)
Calcium: 9.2 mg/dL (ref 8.9–10.3)
Chloride: 106 mmol/L (ref 98–111)
Creatinine, Ser: 1.06 mg/dL — ABNORMAL HIGH (ref 0.44–1.00)
GFR calc Af Amer: 59 mL/min — ABNORMAL LOW (ref >60)
GFR calc non Af Amer: 51 mL/min — ABNORMAL LOW (ref >60)
Glucose, Bld: 167 mg/dL — ABNORMAL HIGH (ref 70–99)
Potassium: 3.9 mmol/L (ref 3.5–5.1)
Sodium: 140 mmol/L (ref 135–145)
Total Bilirubin: 0.9 mg/dL (ref 0.3–1.2)
Total Protein: 6.7 g/dL (ref 6.5–8.1)

## 2018-11-20 LAB — DIFFERENTIAL
Abs Immature Granulocytes: 0.01 10*3/uL (ref 0.00–0.07)
Basophils Absolute: 0 10*3/uL (ref 0.0–0.1)
Basophils Relative: 1 %
Eosinophils Absolute: 0.1 10*3/uL (ref 0.0–0.5)
Eosinophils Relative: 2 %
Immature Granulocytes: 0 %
Lymphocytes Relative: 39 %
Lymphs Abs: 2.5 10*3/uL (ref 0.7–4.0)
Monocytes Absolute: 0.9 10*3/uL (ref 0.1–1.0)
Monocytes Relative: 13 %
Neutro Abs: 3 10*3/uL (ref 1.7–7.7)
Neutrophils Relative %: 45 %

## 2018-11-20 LAB — I-STAT CHEM 8, ED
BUN: 24 mg/dL — ABNORMAL HIGH (ref 8–23)
Calcium, Ion: 1.09 mmol/L — ABNORMAL LOW (ref 1.15–1.40)
Chloride: 108 mmol/L (ref 98–111)
Creatinine, Ser: 1 mg/dL (ref 0.44–1.00)
Glucose, Bld: 163 mg/dL — ABNORMAL HIGH (ref 70–99)
HCT: 41 % (ref 36.0–46.0)
Hemoglobin: 13.9 g/dL (ref 12.0–15.0)
Potassium: 3.9 mmol/L (ref 3.5–5.1)
Sodium: 141 mmol/L (ref 135–145)
TCO2: 24 mmol/L (ref 22–32)

## 2018-11-20 LAB — POCT I-STAT 7, (LYTES, BLD GAS, ICA,H+H)
Acid-base deficit: 5 mmol/L — ABNORMAL HIGH (ref 0.0–2.0)
Bicarbonate: 19.4 mmol/L — ABNORMAL LOW (ref 20.0–28.0)
Calcium, Ion: 1.19 mmol/L (ref 1.15–1.40)
HCT: 31 % — ABNORMAL LOW (ref 36.0–46.0)
Hemoglobin: 10.5 g/dL — ABNORMAL LOW (ref 12.0–15.0)
O2 Saturation: 90 %
Patient temperature: 97.5
Potassium: 4.4 mmol/L (ref 3.5–5.1)
Sodium: 142 mmol/L (ref 135–145)
TCO2: 20 mmol/L — ABNORMAL LOW (ref 22–32)
pCO2 arterial: 33.1 mmHg (ref 32.0–48.0)
pH, Arterial: 7.372 (ref 7.350–7.450)
pO2, Arterial: 57 mmHg — ABNORMAL LOW (ref 83.0–108.0)

## 2018-11-20 LAB — SARS CORONAVIRUS 2 BY RT PCR (HOSPITAL ORDER, PERFORMED IN ~~LOC~~ HOSPITAL LAB): SARS Coronavirus 2: NEGATIVE

## 2018-11-20 LAB — CBC
HCT: 38 % (ref 36.0–46.0)
Hemoglobin: 12 g/dL (ref 12.0–15.0)
MCH: 28.2 pg (ref 26.0–34.0)
MCHC: 31.6 g/dL (ref 30.0–36.0)
MCV: 89.4 fL (ref 80.0–100.0)
Platelets: 234 10*3/uL (ref 150–400)
RBC: 4.25 MIL/uL (ref 3.87–5.11)
RDW: 17.2 % — ABNORMAL HIGH (ref 11.5–15.5)
WBC: 6.5 10*3/uL (ref 4.0–10.5)
nRBC: 0 % (ref 0.0–0.2)

## 2018-11-20 LAB — PROTIME-INR
INR: 1 (ref 0.8–1.2)
Prothrombin Time: 13 seconds (ref 11.4–15.2)

## 2018-11-20 LAB — MAGNESIUM: Magnesium: 1.5 mg/dL — ABNORMAL LOW (ref 1.7–2.4)

## 2018-11-20 LAB — CBG MONITORING, ED: Glucose-Capillary: 157 mg/dL — ABNORMAL HIGH (ref 70–99)

## 2018-11-20 LAB — APTT: aPTT: 28 seconds (ref 24–36)

## 2018-11-20 LAB — GLUCOSE, CAPILLARY
Glucose-Capillary: 156 mg/dL — ABNORMAL HIGH (ref 70–99)
Glucose-Capillary: 178 mg/dL — ABNORMAL HIGH (ref 70–99)

## 2018-11-20 LAB — MRSA PCR SCREENING: MRSA by PCR: NEGATIVE

## 2018-11-20 LAB — TROPONIN I (HIGH SENSITIVITY): Troponin I (High Sensitivity): 70 ng/L — ABNORMAL HIGH (ref <18)

## 2018-11-20 SURGERY — IR WITH ANESTHESIA
Anesthesia: General

## 2018-11-20 MED ORDER — LABETALOL HCL 5 MG/ML IV SOLN
INTRAVENOUS | Status: AC
  Filled 2018-11-20: qty 4

## 2018-11-20 MED ORDER — EPHEDRINE SULFATE-NACL 50-0.9 MG/10ML-% IV SOSY
PREFILLED_SYRINGE | INTRAVENOUS | Status: DC | PRN
  Administered 2018-11-20: 5 mg via INTRAVENOUS

## 2018-11-20 MED ORDER — SODIUM CHLORIDE 0.9% FLUSH: 3.0000 mL | Freq: Once | INTRAVENOUS | Status: DC

## 2018-11-20 MED ORDER — SODIUM CHLORIDE 0.9 % IV SOLN
INTRAVENOUS | Status: DC
  Administered 2018-11-20 (×2): via INTRAVENOUS

## 2018-11-20 MED ORDER — CLEVIDIPINE BUTYRATE 0.5 MG/ML IV EMUL
0.0000 mg/h | INTRAVENOUS | Status: AC
  Administered 2018-11-20: 20:00:00 1 mg/h via INTRAVENOUS
  Administered 2018-11-21: 2 mg/h via INTRAVENOUS
  Filled 2018-11-20: qty 50

## 2018-11-20 MED ORDER — IOHEXOL 300 MG/ML  SOLN
150.0000 mL | Freq: Once | INTRAMUSCULAR | Status: AC | PRN
  Administered 2018-11-20: 80 mL via INTRA_ARTERIAL

## 2018-11-20 MED ORDER — ACETAMINOPHEN 160 MG/5ML PO SOLN
650.0000 mg | ORAL | Status: DC | PRN
  Administered 2018-11-23 – 2018-11-24 (×2): 650 mg
  Filled 2018-11-20 (×3): qty 20.3

## 2018-11-20 MED ORDER — IOHEXOL 350 MG/ML SOLN
75.0000 mL | Freq: Once | INTRAVENOUS | Status: AC | PRN
  Administered 2018-11-20: 75 mL via INTRAVENOUS

## 2018-11-20 MED ORDER — STROKE: EARLY STAGES OF RECOVERY BOOK: Freq: Once | Status: DC

## 2018-11-20 MED ORDER — IOHEXOL 300 MG/ML  SOLN
150.0000 mL | Freq: Once | INTRAMUSCULAR | Status: AC | PRN
  Administered 2018-11-20: 50 mL via INTRA_ARTERIAL

## 2018-11-20 MED ORDER — CALCIUM GLUCONATE-NACL 1-0.675 GM/50ML-% IV SOLN
1.0000 g | Freq: Once | INTRAVENOUS | Status: AC
  Administered 2018-11-20: 1000 mg via INTRAVENOUS
  Filled 2018-11-20: qty 50

## 2018-11-20 MED ORDER — PROPOFOL 1000 MG/100ML IV EMUL
0.0000 ug/kg/min | INTRAVENOUS | Status: DC
  Administered 2018-11-20: 22:00:00 50 ug/kg/min via INTRAVENOUS
  Administered 2018-11-21: 25 ug/kg/min via INTRAVENOUS
  Administered 2018-11-21: 20 ug/kg/min via INTRAVENOUS
  Filled 2018-11-20 (×4): qty 100

## 2018-11-20 MED ORDER — CHLORHEXIDINE GLUCONATE 0.12% ORAL RINSE (MEDLINE KIT)
15.0000 mL | Freq: Two times a day (BID) | OROMUCOSAL | Status: DC
  Administered 2018-11-20 – 2018-11-22 (×5): 15 mL via OROMUCOSAL

## 2018-11-20 MED ORDER — CHLORHEXIDINE GLUCONATE CLOTH 2 % EX PADS
6.0000 | MEDICATED_PAD | Freq: Every day | CUTANEOUS | Status: DC
  Administered 2018-11-20 – 2018-12-02 (×12): 6 via TOPICAL

## 2018-11-20 MED ORDER — TIROFIBAN HCL IN NACL 5-0.9 MG/100ML-% IV SOLN
INTRAVENOUS | Status: AC
  Filled 2018-11-20: qty 100

## 2018-11-20 MED ORDER — FENTANYL CITRATE (PF) 100 MCG/2ML IJ SOLN
INTRAMUSCULAR | Status: AC
  Filled 2018-11-20: qty 2

## 2018-11-20 MED ORDER — PANTOPRAZOLE SODIUM 40 MG IV SOLR
40.0000 mg | Freq: Every day | INTRAVENOUS | Status: DC
  Administered 2018-11-20 – 2018-11-26 (×7): 40 mg via INTRAVENOUS
  Filled 2018-11-20 (×7): qty 40

## 2018-11-20 MED ORDER — SODIUM CHLORIDE 0.9 % IV SOLN
INTRAVENOUS | Status: DC | PRN
  Administered 2018-11-20: 17:00:00 50 ug/min via INTRAVENOUS

## 2018-11-20 MED ORDER — ACETAMINOPHEN 650 MG RE SUPP
650.0000 mg | RECTAL | Status: DC | PRN
  Administered 2018-11-23: 650 mg via RECTAL
  Filled 2018-11-20: qty 1

## 2018-11-20 MED ORDER — LACTATED RINGERS IV SOLN
INTRAVENOUS | Status: DC | PRN
  Administered 2018-11-20: 17:00:00 via INTRAVENOUS

## 2018-11-20 MED ORDER — NITROGLYCERIN 1 MG/10 ML FOR IR/CATH LAB
INTRA_ARTERIAL | Status: AC
  Filled 2018-11-20: qty 10

## 2018-11-20 MED ORDER — ACETAMINOPHEN 325 MG PO TABS: 650.0000 mg | ORAL_TABLET | ORAL | Status: DC | PRN

## 2018-11-20 MED ORDER — ACETAMINOPHEN 160 MG/5ML PO SOLN: 650.0000 mg | ORAL | Status: DC | PRN

## 2018-11-20 MED ORDER — ASPIRIN 81 MG PO CHEW
CHEWABLE_TABLET | ORAL | Status: AC
  Filled 2018-11-20: qty 1

## 2018-11-20 MED ORDER — CLOPIDOGREL BISULFATE 300 MG PO TABS
ORAL_TABLET | ORAL | Status: AC
  Filled 2018-11-20: qty 1

## 2018-11-20 MED ORDER — FENTANYL CITRATE (PF) 100 MCG/2ML IJ SOLN
INTRAMUSCULAR | Status: DC | PRN
  Administered 2018-11-20: 100 ug via INTRAVENOUS

## 2018-11-20 MED ORDER — PHENYLEPHRINE 40 MCG/ML (10ML) SYRINGE FOR IV PUSH (FOR BLOOD PRESSURE SUPPORT)
PREFILLED_SYRINGE | INTRAVENOUS | Status: DC | PRN
  Administered 2018-11-20: 80 ug via INTRAVENOUS

## 2018-11-20 MED ORDER — TICAGRELOR 90 MG PO TABS
ORAL_TABLET | ORAL | Status: AC
  Filled 2018-11-20: qty 2

## 2018-11-20 MED ORDER — CEFAZOLIN SODIUM-DEXTROSE 2-4 GM/100ML-% IV SOLN
INTRAVENOUS | Status: AC
  Filled 2018-11-20: qty 100

## 2018-11-20 MED ORDER — EPTIFIBATIDE 20 MG/10ML IV SOLN
INTRAVENOUS | Status: AC
  Filled 2018-11-20: qty 10

## 2018-11-20 MED ORDER — FENTANYL CITRATE (PF) 100 MCG/2ML IJ SOLN
25.0000 ug | INTRAMUSCULAR | Status: DC | PRN
  Administered 2018-11-20 – 2018-11-22 (×3): 25 ug via INTRAVENOUS
  Filled 2018-11-20 (×2): qty 2

## 2018-11-20 MED ORDER — SODIUM CHLORIDE 0.9 % IV SOLN: 50.0000 mL | Freq: Once | INTRAVENOUS | Status: DC

## 2018-11-20 MED ORDER — IOHEXOL 300 MG/ML  SOLN
150.0000 mL | Freq: Once | INTRAMUSCULAR | Status: AC | PRN
  Administered 2018-11-20: 19:00:00 75 mL via INTRA_ARTERIAL

## 2018-11-20 MED ORDER — FENTANYL CITRATE (PF) 100 MCG/2ML IJ SOLN
INTRAMUSCULAR | Status: AC
  Filled 2018-11-20: qty 4

## 2018-11-20 MED ORDER — ACETAMINOPHEN 650 MG RE SUPP: 650.0000 mg | RECTAL | Status: DC | PRN

## 2018-11-20 MED ORDER — LABETALOL HCL 5 MG/ML IV SOLN
20.0000 mg | Freq: Once | INTRAVENOUS | Status: AC
  Administered 2018-11-20: 20 mg via INTRAVENOUS

## 2018-11-20 MED ORDER — PROPOFOL 500 MG/50ML IV EMUL
INTRAVENOUS | Status: DC | PRN
  Administered 2018-11-20: 25 ug/kg/min via INTRAVENOUS

## 2018-11-20 MED ORDER — ORAL CARE MOUTH RINSE
15.0000 mL | OROMUCOSAL | Status: DC
  Administered 2018-11-20 – 2018-11-22 (×19): 15 mL via OROMUCOSAL

## 2018-11-20 MED ORDER — SODIUM CHLORIDE 0.9 % IV SOLN: INTRAVENOUS | Status: DC

## 2018-11-20 MED ORDER — CEFAZOLIN SODIUM-DEXTROSE 2-3 GM-%(50ML) IV SOLR
INTRAVENOUS | Status: DC | PRN
  Administered 2018-11-20: 2 g via INTRAVENOUS

## 2018-11-20 MED ORDER — INSULIN ASPART 100 UNIT/ML ~~LOC~~ SOLN
2.0000 [IU] | SUBCUTANEOUS | Status: DC
  Administered 2018-11-20 – 2018-11-21 (×2): 4 [IU] via SUBCUTANEOUS

## 2018-11-20 MED ORDER — ALTEPLASE (STROKE) FULL DOSE INFUSION
0.9000 mg/kg | Freq: Once | INTRAVENOUS | Status: AC
  Administered 2018-11-20: 41.8 mg via INTRAVENOUS
  Filled 2018-11-20: qty 100

## 2018-11-20 MED ORDER — FENTANYL CITRATE (PF) 100 MCG/2ML IJ SOLN
25.0000 ug | INTRAMUSCULAR | Status: DC | PRN
  Administered 2018-11-20 – 2018-11-21 (×2): 50 ug via INTRAVENOUS
  Administered 2018-11-21 (×2): 100 ug via INTRAVENOUS
  Administered 2018-11-21 (×2): 75 ug via INTRAVENOUS
  Administered 2018-11-22 (×3): 25 ug via INTRAVENOUS
  Administered 2018-11-23: 50 ug via INTRAVENOUS
  Administered 2018-11-23: 25 ug via INTRAVENOUS
  Filled 2018-11-20 (×8): qty 2

## 2018-11-20 MED ORDER — SENNOSIDES-DOCUSATE SODIUM 8.6-50 MG PO TABS: 1.0000 | ORAL_TABLET | Freq: Every evening | ORAL | Status: DC | PRN

## 2018-11-20 MED ORDER — NICARDIPINE HCL IN NACL 20-0.86 MG/200ML-% IV SOLN: 0.0000 mg/h | INTRAVENOUS | Status: DC

## 2018-11-20 MED ORDER — ROCURONIUM BROMIDE 50 MG/5ML IV SOSY
PREFILLED_SYRINGE | INTRAVENOUS | Status: DC | PRN
  Administered 2018-11-20: 50 mg via INTRAVENOUS
  Administered 2018-11-20: 20 mg via INTRAVENOUS

## 2018-11-20 NOTE — Progress Notes (Signed)
Pt arrived to IT intubated and sedated, under the care of anesthesia at this time. Unable to assess NIH scale. TPA infusion complete

## 2018-11-20 NOTE — Transfer of Care (Signed)
Immediate Anesthesia Transfer of Care Note  Patient: [place]  Procedure(s) Performed: CODE STROKE (N/A )  Patient Location: ICU  Anesthesia Type:General  Level of Consciousness: sedated, unresponsive and Patient remains intubated per anesthesia plan  Airway & Oxygen Therapy: Patient remains intubated per anesthesia plan and Patient placed on Ventilator (see vital sign flow sheet for setting)  Post-op Assessment: Report given to RN and Post -op Vital signs reviewed and stable  Post vital signs: Reviewed and stable  Last Vitals:  Vitals Value Taken Time  BP 170/86 11/20/18 2000  Temp    Pulse 66 11/20/18 2013  Resp 16 11/20/18 2013  SpO2 100 % 11/20/18 2013  Vitals shown include unvalidated device data.  Last Pain:  Vitals:   11/20/18 1950  TempSrc: Axillary         Complications: No apparent anesthesia complications

## 2018-11-20 NOTE — Code Documentation (Signed)
1545: arrived in CT to assist with code stroke. On arrival, tPA started. BP 153/55, HR 102, RR 16, spO2 99%. Pt brought back to ED for intubation. 7.0 ETT inserted at 1605, pt HR and BP elevated after intubation. HR as high as 190s, SBP 222, 20mg  Labetalol given, tPA paused at 1619 per Dr Leonel Ramsay. Pt HR down to low 100s, BP 118/60 after Labetalol. Arrived in IR at 1626 with anesthesia.

## 2018-11-20 NOTE — ED Notes (Signed)
Pt intubated at 1600 rocoronium 50mg  and propofol 100mg  given

## 2018-11-20 NOTE — Progress Notes (Signed)
[facility] Progress Note Patient Name: [name] DOB: 02-Feb-1941 MRN: [id number]   Date of Service  11/20/2018  HPI/Events of Note  77/F with L sided weakness, facial droop and slurred speech, found to have R M1 occlusion and patient was evaluated by IR for mechanical thrombectomy.   Pt had occluded right MCA distally, occluded left ICA, poor filling of right VA.  Revascularization was however unsuccessful.  At the time of my evaluation, BP 156/69, HR 79, RR 13, 96% O2 sats.  eICU Interventions  Continue with medical mgt. Goal SBP in the 120s-160s.  Start ASA 24 hours after tPA. Continue with vent support.  Weaning trial in the AM.   Glucose control with insulin. SCDS for DVT prophylaxis.  Protonix for GI prophylaxis.      Intervention Category Evaluation Type: New Patient Evaluation  [name] 11/20/2018, 8:50 PM

## 2018-11-20 NOTE — Anesthesia Preprocedure Evaluation (Addendum)
Anesthesia Evaluation  Patient identified by MRN, date of birth, ID band Patient awake    Reviewed: Allergy & Precautions, H&P , NPO status , Patient's Chart, lab work & pertinent test results  Airway Mallampati: II  TM Distance: >3 FB Neck ROM: Full    Dental no notable dental hx. (+) Edentulous Upper, Edentulous Lower, Dental Advisory Given   Pulmonary former smoker,    Pulmonary exam normal breath sounds clear to auscultation       Cardiovascular hypertension, Pt. on medications and Pt. on home beta blockers + CAD and + Past MI   Rhythm:Regular Rate:Normal     Neuro/Psych CVA, Residual Symptoms negative psych ROS   GI/Hepatic negative GI ROS, Neg liver ROS,   Endo/Other  diabetes, Type 2, Oral Hypoglycemic Agents  Renal/GU negative Renal ROS  negative genitourinary   Musculoskeletal  (+) Arthritis , Osteoarthritis,    Abdominal   Peds  Hematology negative hematology ROS (+)   Anesthesia Other Findings   Reproductive/Obstetrics negative OB ROS                            Anesthesia Physical Anesthesia Plan  ASA: III and emergent  Anesthesia Plan: General   Post-op Pain Management:    Induction: Intravenous, Rapid sequence and Cricoid pressure planned  PONV Risk Score and Plan: 3 and Ondansetron, Dexamethasone and Treatment may vary due to age or medical condition  Airway Management Planned: Oral ETT  Additional Equipment:   Intra-op Plan:   Post-operative Plan: Possible Post-op intubation/ventilation and Extubation in OR  Informed Consent: I have reviewed the patients History and Physical, chart, labs and discussed the procedure including the risks, benefits and alternatives for the proposed anesthesia with the patient or authorized representative who has indicated his/her understanding and acceptance.     Dental advisory given  Plan Discussed with: CRNA  Anesthesia  Plan Comments:        Anesthesia Quick Evaluation

## 2018-11-20 NOTE — Procedures (Signed)
S/P RT common carotid arteriogram Lt CFA approach  Findings 1.occluded Rt MCA distal M1 seg. Unsuccessful revascularization  Despite x 2 passes with 50mm xc 85mm embotrap retriever device and penumbra aspiration   due to underlying hard ICAD. Unsuccessful intracranial rescue stenting dut vascular tortuosity. Final TICI score 2A S.Emersynn Deatley MD

## 2018-11-20 NOTE — Progress Notes (Signed)
Patient ID: [name], female   DOB: 1940-11-08, 78 y.o.   MRN: [id number] INR. 68 Y F RT H LSW 12NOON  MRSS 0 to 1 New onset RT gaze deviation and lt sided weaknes. CT BRAIN NO ICH ASPECTS 10  CTA occluded RT MCA M1 distally.Old occluded LT ICA . Poor filling of  RT VA. Endovascular treatment D/W son. Procedure,reasons and alternatives discussed as well as risk of ICH of 10 % ,worsening neuro deficit death ,inability to revascularize and vascular injury reviewed with son. He understood and consented to endovascular treatment. S.Dawsyn Ramsaran MD

## 2018-11-20 NOTE — Consult Note (Addendum)
Neurology history and physical    History is obtained from: Son  HPI: [name] is a 78 y.o. female with history of myocardial infarction, hypertension, hyperlipidemia, diabetes, carotid artery occlusion on the left, CAD and arthritis.  Patient lives by herself, and cares for self however her son looks in on her every day.  Per son patient was last talked to at approximately 12:00 today.  Her family member visited her right about noon, and her son talked her on the phone she is normal at that time.  She was then found with a left-sided neglect, eye deviation to the right and flaccid left side.  Patient was recently at the hospital for what sounds like by description a pericardial effusion which was treated with diuretics.  Patient currently is on aspirin.  Patient was brought immediately to the hospital as a code stroke.  On arrival she was brought to CT scanner to obtain a CT head/CTA head and neck.  In the emergency department, she was treated with IV TPA after discussion of the risks and benefits with son.  She had a CTA demonstrating right M1 occlusion and thrombectomy was discussed with her son.  He felt like she would want to proceed and therefore she was intubated in the ER.  Peri-intubation, she had a severe spike in her blood pressure up to 409 systolic.  LKW: 12 PM tpa given?: no,  Premorbid modified Rankin scale (mRS): 1 NIH stroke score: 22  ROS:  Unable to obtain due to altered mental status.   Past Medical History:  Diagnosis Date  . Arthritis   . CAD (coronary artery disease)   . Carotid artery occlusion   . Diabetes mellitus   . Hyperlipidemia   . Hypertension   . Myocardial infarction [facility])      Family History  Problem Relation Age of Onset  . Heart disease Mother   . Diabetes Mother   . Heart disease Brother   . Diabetes Brother     Social History:   reports that she quit smoking about 31 years ago. Her smoking use included cigarettes. She smoked 1.00  pack per day. She has never used smokeless tobacco. She reports that she does not drink alcohol. No history on file for drug.  Medications  Current Facility-Administered Medications:  .  sodium chloride flush (NS) 0.9 % injection 3 mL, 3 mL, Intravenous, Once, Deno Etienne, DO  Current Outpatient Medications:  .  amLODipine (NORVASC) 10 MG tablet, Take 10 mg by mouth daily.  , Disp: , Rfl:  .  aspirin EC 81 MG tablet, Take 81 mg by mouth daily.  , Disp: , Rfl:  .  cholecalciferol (VITAMIN D) 1000 UNITS tablet, Take 2,000 Units by mouth daily., Disp: , Rfl:  .  cilostazol (PLETAL) 100 MG tablet, Take 100 mg by mouth daily.  , Disp: , Rfl:  .  cloNIDine (CATAPRES) 0.1 MG tablet, Take 0.1 mg by mouth 2 (two) times daily.  , Disp: , Rfl:  .  glipiZIDE (GLUCOTROL) 5 MG tablet, Take 5 mg by mouth daily. Take one half tab at noon, Disp: , Rfl:  .  glipiZIDE-metformin (METAGLIP) 5-500 MG per tablet, Take 1 tablet by mouth 2 (two) times daily before a meal.  , Disp: , Rfl:  .  labetalol (NORMODYNE) 200 MG tablet, Take 200 mg by mouth 2 (two) times daily.  , Disp: , Rfl:  .  loratadine (CLARITIN) 10 MG tablet, Take 10 mg by mouth Daily.,  Disp: , Rfl:  .  simvastatin (ZOCOR) 20 MG tablet, Take 20 mg by mouth at bedtime.  , Disp: , Rfl:  .  sitaGLIPtin (JANUVIA) 100 MG tablet, Take 100 mg by mouth daily.  , Disp: , Rfl:  .  valsartan-hydrochlorothiazide (DIOVAN-HCT) 320-12.5 MG per tablet, Take 1 tablet by mouth daily.  , Disp: , Rfl:    Exam: Current vital signs: There were no vitals taken for this visit. Vital signs in last 24 hours:    Physical Exam  Constitutional: Appears well-developed and well-nourished.  Psych: Affect appropriate to situation Eyes: No scleral injection HENT: No OP obstrucion Head: Normocephalic.  Cardiovascular: Normal rate and regular rhythm.  Respiratory: Effort normal, non-labored breathing GI: Soft.  No distension. There is no tenderness.  Skin:  WDI  Neuro: Mental Status: Patient is awake. She denies any symptoms, unaware of her deficits. Shows left-sided neglect Cranial Nerves: II: Left  hemianopsia III,IV, VI: Eyes are deviated to the right. Pupils equal, round and reactive to light V: Decreased on the left VII: Left facial droop Motor: Tone is normal. Bulk is normal.  She has 2/5 movement in her left upper extremity, 0/5 in her left lower extremity Sensory: She does not respond to noxious stimulation on the left Cerebellar: No clear ataxia on the right  Labs I have reviewed labs in epic and the results pertinent to this consultation are:   CBC    Component Value Date/Time   WBC 6.3 01/07/2007 0500   RBC 3.54 (L) 01/07/2007 0500   HGB 13.9 11/20/2018 1523   HCT 41.0 11/20/2018 1523   PLT 171 DELTA CHECK NOTED 01/07/2007 0500   MCV 86.7 01/07/2007 0500   MCHC 33.7 01/07/2007 0500   RDW 15.0 (H) 01/07/2007 0500    CMP     Component Value Date/Time   NA 141 11/20/2018 1523   K 3.9 11/20/2018 1523   CL 108 11/20/2018 1523   CO2 22 01/07/2007 0500   GLUCOSE 163 (H) 11/20/2018 1523   BUN 24 (H) 11/20/2018 1523   CREATININE 1.00 11/20/2018 1523   CALCIUM 7.9 (L) 01/07/2007 0500   PROT 6.8 01/05/2007 1126   ALBUMIN 3.9 01/05/2007 1126   AST 17 01/05/2007 1126   ALT 19 01/05/2007 1126   ALKPHOS 58 01/05/2007 1126   BILITOT 0.7 01/05/2007 1126   GFRNONAA >60 01/07/2007 0500   GFRAA  01/07/2007 0500    >60        The eGFR has been calculated using the MDRD equation. This calculation has not been validated in all clinical    Lipid Panel  No results found for: CHOL, TRIG, HDL, CHOLHDL, VLDL, LDLCALC, LDLDIRECT   Imaging I have reviewed the images obtained:  CT-scan of the brain -aspects 10, CTA- right M1 occlusion  Etta Quill PA-C [facility] [phone]  M-F  (9:00 am- 5:00 PM)  11/20/2018, 3:29 PM   I have seen and evaluated the patient. I have reviewed the above note and made  appropriate changes.   Assessment:  78 year old female with right M1 occlusion.  Given that she has a good functional status at baseline, thrombectomy was discussed with family and is being pursued in addition to tpa.  We will need to get records from [facility] regarding her recent hospitalization.  I do not have any record of atrial fibrillation, but it appeared that this was the rhythm after intubation. She will need ICU monitoring.   Recommendations: - HgbA1c, fasting lipid panel - MRI  of the brain without contrast - Frequent neuro checks - Echocardiogram - Prophylactic therapy-none  - Risk factor modification - Telemetry monitoring - PT consult, OT consult, Speech consult - Stroke team to follow - appreciate ccm for airway, heart failure management  This patient is critically ill and at significant risk of neurological worsening, death and care requires constant monitoring of vital signs, hemodynamics,respiratory and cardiac monitoring, neurological assessment, discussion with family, other specialists and medical decision making of high complexity. I spent 45 minutes of neurocritical care time  in the care of  this patient. This was time spent independent of any time provided by nurse practitioner or PA.  Roland Rack, MD [facility] [phone]  If 7pm- 7am, please page neurology on call as listed in [place]. 11/20/2018  5:05 PM

## 2018-11-20 NOTE — Anesthesia Postprocedure Evaluation (Signed)
Anesthesia Post Note  Patient: [name]  Procedure(s) Performed: CODE STROKE (N/A )     Patient location during evaluation: ICU Anesthesia Type: General Level of consciousness: sedated and patient remains intubated per anesthesia plan Pain management: pain level controlled Vital Signs Assessment: post-procedure vital signs reviewed and stable Respiratory status: patient remains intubated per anesthesia plan Cardiovascular status: stable Postop Assessment: no apparent nausea or vomiting Anesthetic complications: no    Last Vitals:  Vitals:   11/20/18 1954 11/20/18 2000  BP:    Pulse:    Resp:    Temp:  (!) 36.4 C  SpO2: 100%     Last Pain:  Vitals:   11/20/18 2000  TempSrc: Axillary                 Audry Pili

## 2018-11-20 NOTE — Progress Notes (Signed)
[facility] on behalf of Dr. Leonel Ramsay to request patients recent admission records. Left message with Medical Records. Lianne Bushy RN BSN .

## 2018-11-20 NOTE — Progress Notes (Signed)
PHARMACIST CODE STROKE RESPONSE  Notified to mix tPA at 1529 by Dr. Leonel Ramsay Delivered tPA to RN at 1540  tPA dose = 4.2 mg bolus over 1 minute followed by 37.6 mg for a total dose of 41.8 mg over 1 hour  Issues/delays encountered (if applicable): After Dr. Leonel Ramsay ordered tPa, had to wait on a stroke bed for weight as well as BP monitor and CBG check before administering tPa.  Mirian Capuchin, 11/20/18 3:49 PM

## 2018-11-20 NOTE — ED Notes (Signed)
COVID swab obtained by RR RN.

## 2018-11-20 NOTE — Consult Note (Signed)
NAME:  [name], MRN:  [id number], DOB:  January 13, 1941, LOS: 0 ADMISSION DATE:  11/20/2018, CONSULTATION DATE:  11/20/18 REFERRING MD: Dr. Leonel Ramsay , CHIEF COMPLAINT:  Facial droop   Brief History   78 y.o. F who lives alone who was found in her bed by her son with L-sided neglect and eye deviation to the R.  Last known well was 12:00 on 8/31.  CTA done in the ED and showed R M1 occlusion and pt and family decided to proceed with thrombectomy.  She was intubated pre-procedure and PCCM was consulted.    History of present illness   [name] is a 78 y.o. F with PMH of CAD and MI, HTN, HL, cardiomyopathy, DM, bilateral carotid artery stenosis s/p carotid endarterectomy in 2008 and Type 2 DM who was found by her son 8/31 in bed with flaccid L side, L facial droop and slurred speech.  She lives independently and he had last spoken to her on the phone around noon today and she seemed normal.  Her home medications include Asa 81mg  and Simvastatin 20mg .   In the ED she was treated with IV TPA.   CTA head significant for R M1 occlusion and son opted to move forward with thrombectomy.  She was hypertensive and tachycardic with max BP of 222/128, treated with Labetalol 20mg  IV. Glucose was elevated at 167,  and Lab results were otherwise non-actionable .  EKG showed sinus tachycardia with prolonged Qtc 510.  She was intubated and taken to IR.  In IR she had right common carotid arteriogram with unsuccessful revascularization despite 2 passes with embotrap retriever device, unsuccessful intracranial rescue stenting due to vascular tortuosity,    Past Medical History  CAD s/p MI, cardiomyopathy, HTN, HL, carotid stenosis, DM, mitral valve insufficiency, ? A.fib  Significant Hospital Events   8/31 Admission, unsuccessful IR revascularization attempt  Consults:  PCCM  Procedures:  ETT 8/31 >  L fem arterial sheath 8/31 >   Significant Diagnostic Tests:  8/31 CT/ CTA head>Hyperdense R MCA,  occlusion of the R distal M1, no hemorrhage, severe atherosclerotic disease with occlusion of the L internal carotid CT head 9/1 >  MRI 9/1 >  Echo 9/1 >   Micro Data:  8/31 Sars-CoV-2 >> neg  Antimicrobials:  None.  Interim history/subjective:  Intubated and sedated on exam  Objective   Blood pressure 118/60, pulse (!) 49, resp. rate 12, weight 46.4 kg, SpO2 99 %.       No intake or output data in the 24 hours ending 11/20/18 1653 Filed Weights   11/20/18 1500  Weight: 46.4 kg   Physical Exam: General: Adult female, frail, resting in bed, in NAD. Neuro: Sedated, not responsive. HEENT: [place]/AT. Sclerae anicteric. ETT in place. Cardiovascular: RRR, no M/R/G.  Lungs: Respirations even and unlabored.  CTA bilaterally, No W/R/R. Abdomen: BS x 4, soft, NT/ND.  Musculoskeletal: No gross deformities, no edema.  Skin: Intact, warm, no rashes.   Assessment & Plan:   Thrombotic R MCA CVA secondary to M1 occlusion, Chronic bilateral ICA stenosis - s/p IR revascularization attempt but unfortunately unsuccessful.  - Stroke workup per neuro. - Hold anti-platelet therapy s/p tPA. - Cleviprex as needed for goal SBP 120 - 160 per neuro. - F/u on repeat CT, MRI, echo. - Will need PT/OT eval when stable.  Respiratory insufficiency - due to inability to protect the airway in the setting of above. - Full vent support. - Daily SBT with hopes of  extubation in AM. - Bronchial hygiene. - Follow CXR.  Hx HTN, HL, Cardiomyopathy (Last cardiology note under Care Everywhere from 07/06/2017 with EF 30-35%), CAD s/p MI, ? A.fib per neuro (some discussions with son regarding possible A.fib; however, no documentation.  Not on any anticoagulation). - Cleviprex PRN for goal SBP 120 - 160 per neuro. - Hold home antihypertensives (Norvasc, Clonidine, Labetalol, Diovan). -Transition Simvastatin to high intensity statin, Rosuvastatin 20mg .  Hx DM. - SSI. - Hold home glipizide, metaglip, januvia. -  Check A1c.   Best practice:  Diet: NPO. Pain/Anxiety/Delirium protocol (if indicated): Propofol gtt / Fentanyl PRN.  RASS goal -1. VAP protocol (if indicated): In place. DVT prophylaxis: SCDs. GI prophylaxis: Protonix. Glucose control: SSI. Mobility: bed rest. Code Status: full code. Family Communication: Son updated by neuro. Disposition: ICU.  Labs   CBC: Recent Labs  Lab 11/20/18 1518 11/20/18 1523  WBC 6.5  --   NEUTROABS 3.0  --   HGB 12.0 13.9  HCT 38.0 41.0  MCV 89.4  --   PLT 234  --     Basic Metabolic Panel: Recent Labs  Lab 11/20/18 1518 11/20/18 1523  NA 140 141  K 3.9 3.9  CL 106 108  CO2 20*  --   GLUCOSE 167* 163*  BUN 20 24*  CREATININE 1.06* 1.00  CALCIUM 9.2  --    GFR: CrCl cannot be calculated (Unknown ideal weight.). Recent Labs  Lab 11/20/18 1518  WBC 6.5    Liver Function Tests: Recent Labs  Lab 11/20/18 1518  AST 19  ALT 32  ALKPHOS 66  BILITOT 0.9  PROT 6.7  ALBUMIN 3.8   No results for input(s): LIPASE, AMYLASE in the last 168 hours. No results for input(s): AMMONIA in the last 168 hours.  ABG    Component Value Date/Time   TCO2 24 11/20/2018 1523     Coagulation Profile: Recent Labs  Lab 11/20/18 1518  INR 1.0    Cardiac Enzymes: No results for input(s): CKTOTAL, CKMB, CKMBINDEX, TROPONINI in the last 168 hours.  HbA1C: Hgb A1c MFr Bld  Date/Time Value Ref Range Status  01/06/2007 03:43 PM   Final   5.9 (NOTE)   The ADA recommends the following therapeutic goals for glycemic   control related to Hgb A1C measurement:   Goal of Therapy:   < 7.0% Hgb A1C   Action Suggested:  > 8.0% Hgb A1C   Ref:  Diabetes Care, 22, Suppl. 1, 1999    CBG: Recent Labs  Lab 11/20/18 1541  GLUCAP 157*    Review of Systems:   Unable to obtain due to pt being encephalopathic.  Past Medical History  She,  has a past medical history of Arthritis, CAD (coronary artery disease), Carotid artery occlusion, Diabetes  mellitus, Hyperlipidemia, Hypertension, and Myocardial infarction ([place]).   Surgical History    Past Surgical History:  Procedure Laterality Date  . CAROTID ENDARTERECTOMY  01/06/2007   right - with Dacron patch angioplasty  . HERNIA REPAIR  2012     Social History   reports that she quit smoking about 31 years ago. Her smoking use included cigarettes. She smoked 1.00 pack per day. She has never used smokeless tobacco. She reports that she does not drink alcohol.   Family History   Her family history includes Diabetes in her brother and mother; Heart disease in her brother and mother.   Allergies No Known Allergies   Home Medications  Prior to Admission medications  Medication Sig Start Date End Date Taking? Authorizing Provider  amLODipine (NORVASC) 10 MG tablet Take 10 mg by mouth daily.      [provider]  aspirin EC 81 MG tablet Take 81 mg by mouth daily.      [provider]  cholecalciferol (VITAMIN D) 1000 UNITS tablet Take 2,000 Units by mouth daily.    [provider]  cilostazol (PLETAL) 100 MG tablet Take 100 mg by mouth daily.      [provider]  cloNIDine (CATAPRES) 0.1 MG tablet Take 0.1 mg by mouth 2 (two) times daily.      [provider]  glipiZIDE (GLUCOTROL) 5 MG tablet Take 5 mg by mouth daily. Take one half tab at noon    [provider]  glipiZIDE-metformin (METAGLIP) 5-500 MG per tablet Take 1 tablet by mouth 2 (two) times daily before a meal.      [provider]  labetalol (NORMODYNE) 200 MG tablet Take 200 mg by mouth 2 (two) times daily.      [provider]  loratadine (CLARITIN) 10 MG tablet Take 10 mg by mouth Daily. 05/28/11   [provider]  simvastatin (ZOCOR) 20 MG tablet Take 20 mg by mouth at bedtime.      [provider]  sitaGLIPtin (JANUVIA) 100 MG tablet Take 100 mg by mouth daily.      [provider]  valsartan-hydrochlorothiazide  (DIOVAN-HCT) 320-12.5 MG per tablet Take 1 tablet by mouth daily.      [provider]     CC time: 40 min.   Montey Hora, [facility] Pager: [phone].  If no answer, [phone] 11/20/2018, 8:37 PM

## 2018-11-20 NOTE — Progress Notes (Signed)
MD aware of pulses

## 2018-11-20 NOTE — Progress Notes (Signed)
MD aware

## 2018-11-20 NOTE — Consult Note (Signed)
INR . Post procedure CT brain NO ICH or mass effect. Lt groin sheath left in place because of IVTPA and sig ASVD of  common femoral artery. Good distal runoff confirmed  Via Lt sheath arteriogram Distal LT DP and PT pulses dopplerable unchanged. Distal RT DP and PT not dopplerable unchanged from start of the procedure. Patient left intubated because the IV TPA and and the 8Fsheath.  S.Brylee Mcgreal MD

## 2018-11-20 NOTE — Progress Notes (Signed)
Anesthesia present for case 

## 2018-11-20 NOTE — Sedation Documentation (Signed)
Groin and pulses assessed at bedside with Shea Evans, RN. Groin and pulses unchanged, see flowsheet. All questions answered to satisfaction before leave pt in his care. Dr. Bennie Dallas called pts son with update.

## 2018-11-20 NOTE — Sedation Documentation (Signed)
SBAR called to [employer], Therapist, sports

## 2018-11-20 NOTE — Sedation Documentation (Addendum)
Pt received TPA 41.8mg  IV (total dose) with 50cc NS bolus to flush line following completed infusion. I was told in hand off it had been charted, but I could find the documentation to support that.

## 2018-11-20 NOTE — ED Provider Notes (Signed)
[facility] EMERGENCY DEPARTMENT Provider Note   CSN: [id number] Arrival date & time: 11/20/18  1515  An emergency department physician performed an initial assessment on this suspected stroke patient at 1515.  History   Chief Complaint No chief complaint on file.  Level 5 caveat due to condition of the patient.  HPI [name] is a 78 y.o. female with history of CAD, chronic left ICA occlusion and known right ICA stenosis, diabetes mellitus, hypertension high, hyperlipidemia, MI, ESRD, and tobacco abuse presents brought in by EMS for evaluation of strokelike symptoms.  Per EMS, the patient lives independently but her son who lives nearby checks up on her daily.  He last saw her in her usual state of health at 11 AM.  He returned to check up on her and found her to be laying in bed stating that she could not get up.  She had a rightward gaze and left-sided facial droop, dysarthric speech, and left upper and lower extremity weakness.    The history is provided by the EMS personnel. The history is limited by the condition of the patient.    Past Medical History:  Diagnosis Date   Arthritis    CAD (coronary artery disease)    Carotid artery occlusion    Diabetes mellitus    Hyperlipidemia    Hypertension    Myocardial infarction [facility])     Patient Active Problem List   Diagnosis Date Noted   Stroke ([place]) 11/20/2018   Middle cerebral artery embolism, right 11/20/2018   Heart murmur 11/20/2018   Bilateral carotid artery stenosis 11/20/2018   Acute respiratory failure with hypoxia ([place])    Encephalopathy acute    Type 2 diabetes mellitus without complication, without long-term current use of insulin (HCC)    End stage renal disease ([place]) 08/11/2011   Occlusion and stenosis of carotid artery without mention of cerebral infarction 08/11/2011    Past Surgical History:  Procedure Laterality Date   CAROTID ENDARTERECTOMY  01/06/2007   right  - with Dacron patch angioplasty   HERNIA REPAIR  2012     OB History   No obstetric history on file.      Home Medications    Prior to Admission medications   Medication Sig Start Date End Date Taking? Authorizing Provider  amLODipine (NORVASC) 10 MG tablet Take 10 mg by mouth daily.      [provider]  aspirin EC 81 MG tablet Take 81 mg by mouth daily.      [provider]  cholecalciferol (VITAMIN D) 1000 UNITS tablet Take 2,000 Units by mouth daily.    [provider]  cilostazol (PLETAL) 100 MG tablet Take 100 mg by mouth daily.      [provider]  cloNIDine (CATAPRES) 0.1 MG tablet Take 0.1 mg by mouth 2 (two) times daily.      [provider]  glipiZIDE (GLUCOTROL) 5 MG tablet Take 5 mg by mouth daily. Take one half tab at noon    [provider]  glipiZIDE-metformin (METAGLIP) 5-500 MG per tablet Take 1 tablet by mouth 2 (two) times daily before a meal.      [provider]  labetalol (NORMODYNE) 200 MG tablet Take 200 mg by mouth 2 (two) times daily.      [provider]  loratadine (CLARITIN) 10 MG tablet Take 10 mg by mouth Daily. 05/28/11   [provider]  simvastatin (ZOCOR) 20 MG tablet Take 20 mg  by mouth at bedtime.      [provider]  sitaGLIPtin (JANUVIA) 100 MG tablet Take 100 mg by mouth daily.      [provider]  valsartan-hydrochlorothiazide (DIOVAN-HCT) 320-12.5 MG per tablet Take 1 tablet by mouth daily.      [provider]    Family History Family History  Problem Relation Age of Onset   Heart disease Mother    Diabetes Mother    Heart disease Brother    Diabetes Brother     Social History Social History   Tobacco Use   Smoking status: Former Smoker    Packs/day: 1.00    Types: Cigarettes    Quit date: 03/23/1987    Years since quitting: 31.6   Smokeless tobacco: Never Used  Substance Use Topics   Alcohol use: No   Drug  use: Not on file     Allergies   Patient has no known allergies.   Review of Systems Review of Systems  Unable to perform ROS: Mental status change     Physical Exam Updated Vital Signs BP 118/60    Pulse 73    Temp (!) 97.5 F (36.4 C) (Axillary)    Resp 11    Ht '5\' 1"'  (1.549 m)    Wt 46.4 kg    SpO2 100%    BMI 19.33 kg/m   Physical Exam Vitals signs and nursing note reviewed.  Constitutional:      General: She is not in acute distress.    Appearance: She is well-developed.  HENT:     Head: Normocephalic and atraumatic.  Eyes:     General:        Right eye: No discharge.        Left eye: No discharge.     Conjunctiva/sclera: Conjunctivae normal.  Neck:     Vascular: No JVD.     Trachea: No tracheal deviation.  Cardiovascular:     Rate and Rhythm: Normal rate.  Pulmonary:     Effort: Pulmonary effort is normal.  Abdominal:     General: There is no distension.  Skin:    General: Skin is warm and dry.     Findings: No erythema.  Neurological:     Mental Status: She is alert.     Comments: Fixed right-sided gaze, dysarthric speech noted.  Left-sided facial droop noted.  She has left upper and lower extremity weakness.  Psychiatric:        Behavior: Behavior normal.      ED Treatments / Results  Labs (all labs ordered are listed, but only abnormal results are displayed) Labs Reviewed  CBC - Abnormal; Notable for the following components:      Result Value   RDW 17.2 (*)    All other components within normal limits  COMPREHENSIVE METABOLIC PANEL - Abnormal; Notable for the following components:   CO2 20 (*)    Glucose, Bld 167 (*)    Creatinine, Ser 1.06 (*)    GFR calc non Af Amer 51 (*)    GFR calc Af Amer 59 (*)    All other components within normal limits  MAGNESIUM - Abnormal; Notable for the following components:   Magnesium 1.5 (*)    All other components within normal limits  GLUCOSE, CAPILLARY - Abnormal; Notable for the following  components:   Glucose-Capillary 156 (*)    All other components within normal limits  I-STAT CHEM 8, ED - Abnormal; Notable for the following components:  BUN 24 (*)    Glucose, Bld 163 (*)    Calcium, Ion 1.09 (*)    All other components within normal limits  CBG MONITORING, ED - Abnormal; Notable for the following components:   Glucose-Capillary 157 (*)    All other components within normal limits  POCT I-STAT 7, (LYTES, BLD GAS, ICA,H+H) - Abnormal; Notable for the following components:   pO2, Arterial 57.0 (*)    Bicarbonate 19.4 (*)    TCO2 20 (*)    Acid-base deficit 5.0 (*)    HCT 31.0 (*)    Hemoglobin 10.5 (*)    All other components within normal limits  TROPONIN I (HIGH SENSITIVITY) - Abnormal; Notable for the following components:   Troponin I (High Sensitivity) 70 (*)    All other components within normal limits  SARS CORONAVIRUS 2 (HOSPITAL ORDER, [place] [facility])  MRSA PCR SCREENING  PROTIME-INR  APTT  DIFFERENTIAL  HEMOGLOBIN A1C  LIPID PANEL  CBC WITH DIFFERENTIAL/PLATELET  BASIC METABOLIC PANEL  BLOOD GAS, ARTERIAL  TRIGLYCERIDES    EKG EKG Interpretation  Date/Time:  Monday November 20 2018 15:55:40 EDT Ventricular Rate:  114 PR Interval:    QRS Duration: 113 QT Interval:  370 QTC Calculation: 510 R Axis:   76 Text Interpretation:  Sinus tachycardia Supraventricular bigeminy Consider right atrial enlargement Left ventricular hypertrophy Repol abnrm suggests ischemia, diffuse leads Prolonged QT interval dt segments depressed in lateral leads, seen on prior.  more pronounced likely rate related Otherwise no significant change Confirmed by Deno Etienne [phone]) on 11/20/2018 4:01:53 PM   Radiology Ct Angio Head W Or Wo Contrast  Result Date: 11/20/2018 CLINICAL DATA:  Code stroke left-sided weakness EXAM: CT ANGIOGRAPHY HEAD AND NECK TECHNIQUE: Multidetector CT imaging of the head and neck was performed using the standard protocol  during bolus administration of intravenous contrast. Multiplanar CT image reconstructions and MIPs were obtained to evaluate the vascular anatomy. Carotid stenosis measurements (when applicable) are obtained utilizing NASCET criteria, using the distal internal carotid diameter as the denominator. CONTRAST:  31m OMNIPAQUE IOHEXOL 350 MG/ML SOLN COMPARISON:  CT head today FINDINGS: CTA NECK FINDINGS Aortic arch: Atherosclerotic aortic arch and proximal great vessels. Proximal great vessels patent. Mild stenosis proximal left subclavian artery due to atherosclerotic disease. Right carotid system: Right common carotid artery widely patent. Prior carotid endarterectomy on the right 2008. Bifurcation widely patent. No significant right carotid stenosis. Left carotid system: Scattered atherosclerotic calcification left common carotid artery. Severe atherosclerotic disease at the bifurcation. Severe stenosis at the origin of the external carotid artery. Occlusion of the left internal carotid artery. Note is made of 95% stenosis left internal carotid artery on catheter angiogram 07/08/2004. Vertebral arteries: Left vertebral artery dominant. Calcific plaque and moderate stenosis at the origin of the left vertebral artery. Remainder of the left vertebral artery is patent to the basilar. Right vertebral artery is occluded at the origin with reconstitution of a small vessel at approximately C5. This vessel is patent to the basilar however there is moderate stenosis distal right vertebral artery. Skeleton: No acute skeletal abnormality. Other neck: Negative for mass or adenopathy. Upper chest: Mild apical scarring and emphysema. No acute abnormality. Review of the MIP images confirms the above findings CTA HEAD FINDINGS Anterior circulation: Extensive atherosclerotic disease and moderate stenosis right cavernous carotid. Occlusion distal right M1 with limited flow in right MCA branches. Anterior cerebral arteries patent  bilaterally. Left internal carotid artery is occluded through the cavernous segment.  Left anterior cerebral artery patent. Left MCA patent through collateral flow through the anterior communicating artery. No significant left MCA stenosis. Posterior circulation: Left vertebral artery widely patent to the basilar. Left PICA patent. Small right vertebral artery is significantly stenotic in the V4 segment with minimal contribution to the basilar. Basilar is patent. Posterior cerebral arteries patent bilaterally without stenosis. Venous sinuses: Normal enhancement Anatomic variants: None Review of the MIP images confirms the above findings IMPRESSION: 1. Occlusion right M1 segment distally with poor flow in the right M2 and M3 branches. 2. Postop right carotid endarterectomy 2008. Right carotid bifurcation widely patent 3. Severe atherosclerotic disease left carotid bifurcation with occlusion of the left internal carotid artery. 4. Left anterior and left middle cerebral arteries appear patent without significant stenosis through collateral flow through the anterior communicating artery. 5. Posterior circulation intact 6. These results were called by telephone at the time of interpretation on 11/20/2018 at 3:54 Pm to Dr. Roland Rack , who verbally acknowledged these results. Electronically Signed   By: Franchot Gallo M.D.   On: 11/20/2018 16:06   Ct Angio Neck W Or Wo Contrast  Result Date: 11/20/2018 CLINICAL DATA:  Code stroke left-sided weakness EXAM: CT ANGIOGRAPHY HEAD AND NECK TECHNIQUE: Multidetector CT imaging of the head and neck was performed using the standard protocol during bolus administration of intravenous contrast. Multiplanar CT image reconstructions and MIPs were obtained to evaluate the vascular anatomy. Carotid stenosis measurements (when applicable) are obtained utilizing NASCET criteria, using the distal internal carotid diameter as the denominator. CONTRAST:  39m OMNIPAQUE IOHEXOL 350  MG/ML SOLN COMPARISON:  CT head today FINDINGS: CTA NECK FINDINGS Aortic arch: Atherosclerotic aortic arch and proximal great vessels. Proximal great vessels patent. Mild stenosis proximal left subclavian artery due to atherosclerotic disease. Right carotid system: Right common carotid artery widely patent. Prior carotid endarterectomy on the right 2008. Bifurcation widely patent. No significant right carotid stenosis. Left carotid system: Scattered atherosclerotic calcification left common carotid artery. Severe atherosclerotic disease at the bifurcation. Severe stenosis at the origin of the external carotid artery. Occlusion of the left internal carotid artery. Note is made of 95% stenosis left internal carotid artery on catheter angiogram 07/08/2004. Vertebral arteries: Left vertebral artery dominant. Calcific plaque and moderate stenosis at the origin of the left vertebral artery. Remainder of the left vertebral artery is patent to the basilar. Right vertebral artery is occluded at the origin with reconstitution of a small vessel at approximately C5. This vessel is patent to the basilar however there is moderate stenosis distal right vertebral artery. Skeleton: No acute skeletal abnormality. Other neck: Negative for mass or adenopathy. Upper chest: Mild apical scarring and emphysema. No acute abnormality. Review of the MIP images confirms the above findings CTA HEAD FINDINGS Anterior circulation: Extensive atherosclerotic disease and moderate stenosis right cavernous carotid. Occlusion distal right M1 with limited flow in right MCA branches. Anterior cerebral arteries patent bilaterally. Left internal carotid artery is occluded through the cavernous segment. Left anterior cerebral artery patent. Left MCA patent through collateral flow through the anterior communicating artery. No significant left MCA stenosis. Posterior circulation: Left vertebral artery widely patent to the basilar. Left PICA patent. Small  right vertebral artery is significantly stenotic in the V4 segment with minimal contribution to the basilar. Basilar is patent. Posterior cerebral arteries patent bilaterally without stenosis. Venous sinuses: Normal enhancement Anatomic variants: None Review of the MIP images confirms the above findings IMPRESSION: 1. Occlusion right M1 segment distally with poor flow in  the right M2 and M3 branches. 2. Postop right carotid endarterectomy 2008. Right carotid bifurcation widely patent 3. Severe atherosclerotic disease left carotid bifurcation with occlusion of the left internal carotid artery. 4. Left anterior and left middle cerebral arteries appear patent without significant stenosis through collateral flow through the anterior communicating artery. 5. Posterior circulation intact 6. These results were called by telephone at the time of interpretation on 11/20/2018 at 3:54 Pm to Dr. Roland Rack , who verbally acknowledged these results. Electronically Signed   By: Franchot Gallo M.D.   On: 11/20/2018 16:06   Dg Chest Port 1 View  Result Date: 11/20/2018 CLINICAL DATA:  Intubation. EXAM: PORTABLE CHEST 1 VIEW COMPARISON:  11/16/2018 FINDINGS: The endotracheal tube terminates approximately 1.5 cm above the carina. Heart size remains enlarged. There is generalized volume overload without overt pulmonary edema. There is pleuroparenchymal scarring at the lung apices. There are trace bilateral pleural effusions. IMPRESSION: 1. The endotracheal tube is borderline low. Repositioning should be considered. 2. Cardiomegaly with mild vascular congestion. Electronically Signed   By: Constance Holster M.D.   On: 11/20/2018 20:49   Ct Head Code Stroke Wo Contrast  Result Date: 11/20/2018 CLINICAL DATA:  Code stroke. Altered level of consciousness. Stroke. ESRD. Carotid endarterectomy on the right 2008 EXAM: CT HEAD WITHOUT CONTRAST TECHNIQUE: Contiguous axial images were obtained from the base of the skull through  the vertex without intravenous contrast. COMPARISON:  None. FINDINGS: Brain: Generalized atrophy without hydrocephalus. Chronic infarct left occipital lobe. Small chronic infarct right cerebellum. Negative for acute infarct, hemorrhage, mass Vascular: Subtle hyperdensity right M1 bifurcation in the sylvian fissure. Skull: Negative Sinuses/Orbits: Negative Other: None ASPECTS ([place] Stroke Program Early CT Score) - Ganglionic level infarction (caudate, lentiform nuclei, internal capsule, insula, M1-M3 cortex): 7 - Supraganglionic infarction (M4-M6 cortex): 3 Total score (0-10 with 10 being normal): 10 IMPRESSION: 1. Subtle hyperdense right MCA. There is occlusion of the right distal M1 on CTA today. 2. ASPECTS is 10 3. No acute infarct or hemorrhage 4. These results were called by telephone at the time of interpretation on 11/20/2018 at 3:54 pm to Dr. Leonel Ramsay , who verbally acknowledged these results. Electronically Signed   By: Franchot Gallo M.D.   On: 11/20/2018 15:54    Procedures .Critical Care Performed by: Renita Papa, PA-C Authorized by: Renita Papa, PA-C   Critical care provider statement:    Critical care time (minutes):  45   Critical care was necessary to treat or prevent imminent or life-threatening deterioration of the following conditions:  CNS failure or compromise   Critical care was time spent personally by me on the following activities:  Discussions with consultants, evaluation of patient's response to treatment, examination of patient, ordering and performing treatments and interventions, ordering and review of laboratory studies, ordering and review of radiographic studies, pulse oximetry, re-evaluation of patient's condition, obtaining history from patient or surrogate and review of old charts   (including critical care time)  Medications Ordered in ED Medications  sodium chloride flush (NS) 0.9 % injection 3 mL (has no administration in time range)  alteplase  (ACTIVASE) 1 mg/mL infusion 41.8 mg (has no administration in time range)    Followed by  0.9 %  sodium chloride infusion (has no administration in time range)  nitroGLYCERIN 100 mcg/mL intra-arterial injection (has no administration in time range)  eptifibatide (INTEGRILIN) 20 MG/10ML injection (has no administration in time range)   stroke: mapping our early stages of recovery book (has  no administration in time range)  0.9 %  sodium chloride infusion ( Intravenous Stopped 11/20/18 2126)  acetaminophen (TYLENOL) tablet 650 mg (has no administration in time range)    Or  acetaminophen (TYLENOL) solution 650 mg (has no administration in time range)    Or  acetaminophen (TYLENOL) suppository 650 mg (has no administration in time range)  pantoprazole (PROTONIX) injection 40 mg (40 mg Intravenous Given 11/20/18 2117)  senna-docusate (Senokot-S) tablet 1 tablet (has no administration in time range)  insulin aspart (novoLOG) injection 2-6 Units (4 Units Subcutaneous Given 11/20/18 2110)  labetalol (NORMODYNE) 5 MG/ML injection (has no administration in time range)  ceFAZolin (ANCEF) 2-4 GM/100ML-% IVPB (has no administration in time range)  clevidipine (CLEVIPREX) infusion 0.5 mg/mL (3 mg/hr Intravenous Rate/Dose Verify 11/20/18 2200)  Chlorhexidine Gluconate Cloth 2 % PADS 6 each (has no administration in time range)  propofol (DIPRIVAN) 1000 MG/100ML infusion (50 mcg/kg/min  46.4 kg Intravenous New Bag/Given 11/20/18 2223)  fentaNYL (SUBLIMAZE) injection 25 mcg (25 mcg Intravenous Given 11/20/18 2221)  fentaNYL (SUBLIMAZE) injection 25-100 mcg (has no administration in time range)  chlorhexidine gluconate (MEDLINE KIT) (PERIDEX) 0.12 % solution 15 mL (15 mLs Mouth Rinse Given 11/20/18 2038)  MEDLINE mouth rinse (15 mLs Mouth Rinse Given 11/20/18 2129)  iohexol (OMNIPAQUE) 350 MG/ML injection 75 mL (75 mLs Intravenous Contrast Given 11/20/18 1540)  labetalol (NORMODYNE) injection 20 mg (20 mg  Intravenous Given 11/20/18 1615)  iohexol (OMNIPAQUE) 300 MG/ML solution 150 mL (80 mLs Intra-arterial Contrast Given 11/20/18 1714)  iohexol (OMNIPAQUE) 300 MG/ML solution 150 mL (75 mLs Intra-arterial Contrast Given 11/20/18 1830)  iohexol (OMNIPAQUE) 300 MG/ML solution 150 mL (50 mLs Intra-arterial Contrast Given 11/20/18 1929)  calcium gluconate 1 g/ 50 mL sodium chloride IVPB ( Intravenous Rate/Dose Verify 11/20/18 2200)     Initial Impression / Assessment and Plan / ED Course  I have reviewed the triage vital signs and the nursing notes.  Pertinent labs & imaging results that were available during my care of the patient were reviewed by me and considered in my medical decision making (see chart for details).        Patient presenting with strokelike symptoms, last known well at 11 AM today.  She is afebrile, hypertensive in the ED.  Code stroke was initiated and work-up in the ED concerning for acute embolic stroke.  The decision was made to give the patient TPA and take her to the IR suite for thrombectomy emergently.  While in the ED the patient became tachycardic and hypertensive after being intubated by anesthesia team but improved with administration of labetalol.  Final Clinical Impressions(s) / ED Diagnoses   Final diagnoses:  Acute embolic stroke [facility])    ED Discharge Orders    None       Renita Papa, PA-C 11/20/18 [place], DO 11/21/18 1459

## 2018-11-20 NOTE — Progress Notes (Signed)
RT NOTES: Patient intubated by anesthesia, patient being manually bagged with 100% fio2 over to IR.

## 2018-11-20 NOTE — Anesthesia Procedure Notes (Signed)
Procedure Name: Intubation Date/Time: 11/13/2018 4:05 PM Performed by: Eligha Bridegroom, CRNA Pre-anesthesia Checklist: Patient identified, Emergency Drugs available, Suction available, Patient being monitored and Timeout performed Patient Re-evaluated:Patient Re-evaluated prior to induction Oxygen Delivery Method: Circle system utilized Preoxygenation: Pre-oxygenation with 100% oxygen Induction Type: IV induction and Rapid sequence Laryngoscope Size: Glidescope Grade View: Grade I Tube type: Oral Tube size: 7.0 mm Number of attempts: 1 Airway Equipment and Method: Stylet and Video-laryngoscopy Placement Confirmation: ETT inserted through vocal cords under direct vision,  positive ETCO2 and breath sounds checked- equal and bilateral Secured at: 21 cm Tube secured with: Tape Dental Injury: Teeth and Oropharynx as per pre-operative assessment

## 2018-11-21 ENCOUNTER — Inpatient Hospital Stay (HOSPITAL_COMMUNITY): Payer: Medicare Other

## 2018-11-21 ENCOUNTER — Encounter (HOSPITAL_COMMUNITY): Payer: Self-pay | Admitting: Interventional Radiology

## 2018-11-21 DIAGNOSIS — I340 Nonrheumatic mitral (valve) insufficiency: Secondary | ICD-10-CM

## 2018-11-21 DIAGNOSIS — I639 Cerebral infarction, unspecified: Secondary | ICD-10-CM

## 2018-11-21 LAB — LIPID PANEL
Cholesterol: 124 mg/dL (ref 0–200)
HDL: 39 mg/dL — ABNORMAL LOW (ref >40)
LDL Cholesterol: 72 mg/dL (ref 0–99)
Total CHOL/HDL Ratio: 3.2 RATIO
Triglycerides: 66 mg/dL (ref <150)
VLDL: 13 mg/dL (ref 0–40)

## 2018-11-21 LAB — ECHOCARDIOGRAM COMPLETE
Height: 61 in
Weight: 1636.7 oz

## 2018-11-21 LAB — CBC WITH DIFFERENTIAL/PLATELET
Abs Immature Granulocytes: 0.01 10*3/uL (ref 0.00–0.07)
Basophils Absolute: 0 10*3/uL (ref 0.0–0.1)
Basophils Relative: 0 %
Eosinophils Absolute: 0 10*3/uL (ref 0.0–0.5)
Eosinophils Relative: 0 %
HCT: 29 % — ABNORMAL LOW (ref 36.0–46.0)
Hemoglobin: 9.2 g/dL — ABNORMAL LOW (ref 12.0–15.0)
Immature Granulocytes: 0 %
Lymphocytes Relative: 13 %
Lymphs Abs: 0.8 10*3/uL (ref 0.7–4.0)
MCH: 28.1 pg (ref 26.0–34.0)
MCHC: 31.7 g/dL (ref 30.0–36.0)
MCV: 88.7 fL (ref 80.0–100.0)
Monocytes Absolute: 0.7 10*3/uL (ref 0.1–1.0)
Monocytes Relative: 12 %
Neutro Abs: 4.6 10*3/uL (ref 1.7–7.7)
Neutrophils Relative %: 75 %
Platelets: 224 10*3/uL (ref 150–400)
RBC: 3.27 MIL/uL — ABNORMAL LOW (ref 3.87–5.11)
RDW: 17.1 % — ABNORMAL HIGH (ref 11.5–15.5)
WBC: 6.2 10*3/uL (ref 4.0–10.5)
nRBC: 0 % (ref 0.0–0.2)

## 2018-11-21 LAB — BASIC METABOLIC PANEL
Anion gap: 9 (ref 5–15)
BUN: 15 mg/dL (ref 8–23)
CO2: 19 mmol/L — ABNORMAL LOW (ref 22–32)
Calcium: 8.3 mg/dL — ABNORMAL LOW (ref 8.9–10.3)
Chloride: 112 mmol/L — ABNORMAL HIGH (ref 98–111)
Creatinine, Ser: 0.98 mg/dL (ref 0.44–1.00)
GFR calc Af Amer: >60 mL/min (ref >60)
GFR calc non Af Amer: 56 mL/min — ABNORMAL LOW (ref >60)
Glucose, Bld: 108 mg/dL — ABNORMAL HIGH (ref 70–99)
Potassium: 3.9 mmol/L (ref 3.5–5.1)
Sodium: 140 mmol/L (ref 135–145)

## 2018-11-21 LAB — GLUCOSE, CAPILLARY
Glucose-Capillary: 39 mg/dL — CL (ref 70–99)
Glucose-Capillary: 97 mg/dL (ref 70–99)
Glucose-Capillary: 72 mg/dL (ref 70–99)
Glucose-Capillary: 90 mg/dL (ref 70–99)
Glucose-Capillary: 156 mg/dL — ABNORMAL HIGH (ref 70–99)
Glucose-Capillary: 136 mg/dL — ABNORMAL HIGH (ref 70–99)
Glucose-Capillary: 175 mg/dL — ABNORMAL HIGH (ref 70–99)

## 2018-11-21 LAB — HEMOGLOBIN A1C
Hgb A1c MFr Bld: 6.6 % — ABNORMAL HIGH (ref 4.8–5.6)
Mean Plasma Glucose: 142.72 mg/dL

## 2018-11-21 MED ORDER — FUROSEMIDE 10 MG/ML IJ SOLN
INTRAMUSCULAR | Status: AC
  Filled 2018-11-21: qty 4

## 2018-11-21 MED ORDER — DEXTROSE 50 % IV SOLN
12.5000 g | Freq: Once | INTRAVENOUS | Status: AC
  Administered 2018-11-21: 12.5 g via INTRAVENOUS

## 2018-11-21 MED ORDER — FUROSEMIDE 10 MG/ML IJ SOLN
40.0000 mg | Freq: Once | INTRAMUSCULAR | Status: AC
  Administered 2018-11-21: 14:00:00 40 mg via INTRAVENOUS

## 2018-11-21 MED ORDER — INSULIN ASPART 100 UNIT/ML ~~LOC~~ SOLN
1.0000 [IU] | SUBCUTANEOUS | Status: DC
  Administered 2018-11-21: 2 [IU] via SUBCUTANEOUS
  Administered 2018-11-21: 1 [IU] via SUBCUTANEOUS
  Administered 2018-11-22 (×3): 2 [IU] via SUBCUTANEOUS
  Administered 2018-11-23: 1 [IU] via SUBCUTANEOUS
  Administered 2018-11-23: 2 [IU] via SUBCUTANEOUS
  Administered 2018-11-23: 1 [IU] via SUBCUTANEOUS
  Administered 2018-11-23 – 2018-11-24 (×5): 2 [IU] via SUBCUTANEOUS
  Administered 2018-11-24: 3 [IU] via SUBCUTANEOUS
  Administered 2018-11-24 – 2018-11-25 (×3): 1 [IU] via SUBCUTANEOUS
  Administered 2018-11-25 – 2018-11-26 (×6): 2 [IU] via SUBCUTANEOUS
  Administered 2018-11-26 (×3): 1 [IU] via SUBCUTANEOUS
  Administered 2018-11-26: 3 [IU] via SUBCUTANEOUS
  Administered 2018-11-26: 1 [IU] via SUBCUTANEOUS
  Administered 2018-11-27: 2 [IU] via SUBCUTANEOUS
  Administered 2018-11-27: 1 [IU] via SUBCUTANEOUS
  Administered 2018-11-27 (×2): 2 [IU] via SUBCUTANEOUS
  Administered 2018-11-27: 3 [IU] via SUBCUTANEOUS
  Administered 2018-11-28 (×7): 2 [IU] via SUBCUTANEOUS
  Administered 2018-11-29: 3 [IU] via SUBCUTANEOUS
  Administered 2018-11-29 – 2018-12-01 (×10): 2 [IU] via SUBCUTANEOUS
  Administered 2018-12-01: 1 [IU] via SUBCUTANEOUS
  Administered 2018-12-01 – 2018-12-02 (×4): 2 [IU] via SUBCUTANEOUS

## 2018-11-21 MED ORDER — DEXTROSE 50 % IV SOLN
INTRAVENOUS | Status: AC
  Filled 2018-11-21: qty 50

## 2018-11-21 NOTE — Progress Notes (Signed)
SLP Cancellation Note  Patient Details Name: [name] MRN: [id number] DOB: 1940-09-13   Cancelled treatment:        Pt intubated. Will follow.    [place] Siren 11/21/2018, 8:07 AM   Orbie Pyo Colvin Caroli.Ed Risk analyst [phone] Office [phone]

## 2018-11-21 NOTE — Progress Notes (Signed)
Inpatient Diabetes Program Recommendations  AACE/ADA: New Consensus Statement on Inpatient Glycemic Control (2015)  Target Ranges:  Prepandial:   less than 140 mg/dL      Peak postprandial:   less than 180 mg/dL (1-2 hours)      Critically ill patients:  140 - 180 mg/dL   Lab Results  Component Value Date   GLUCAP 72 11/21/2018   HGBA1C 6.6 (H) 11/21/2018    Review of Glycemic Control Results for [name], [name] (MRN [id number]) as of 11/21/2018 10:42  Ref. Range 11/20/2018 20:22 11/20/2018 23:06 11/21/2018 03:23 11/21/2018 03:43 11/21/2018 08:08  Glucose-Capillary Latest Ref Range: 70 - 99 mg/dL 156 (H) 178 (H) 39 (LL) 97 72   Inpatient Diabetes Program Recommendations:   -Consider decreasing to 1-3 correction scale for phase 1 sensitive ICU orders.   Thank you, Nani Gasser. Machaela Caterino, RN, MSN, CDE  Diabetes Coordinator Inpatient Glycemic Control Team Team Pager [phone] (8am-5pm) 11/21/2018 10:43 AM

## 2018-11-21 NOTE — Progress Notes (Signed)
Referring Physician(s): Code Stroke- Greta Doom  Supervising Physician: Luanne Bras  Patient Status:  [facility] - In-pt  Chief Complaint: None- intubated with sedation  Subjective:  Right MCA distal M1 segment occlusion s/p cerebral arteriogram with unsuccessful mechanical thrombectomy (due to underlying hard ICAD) and unsuccessful intracranial rescue stenting (due to vascular tortuosity) achieving a TICI 2A revascularization 11/20/2018 by Dr. Estanislado Pandy. Patient laying in bed intubated with sedation. She does not follow commands. Moving RLE spontaneously, but no spontaneous movements of other extremities. Left groin incision c/d/i with sheath in place.   Allergies: Patient has no known allergies.  Medications: Prior to Admission medications   Medication Sig Start Date End Date Taking? Authorizing Provider  amLODipine (NORVASC) 10 MG tablet Take 10 mg by mouth daily.      [provider]  aspirin EC 81 MG tablet Take 81 mg by mouth daily.      [provider]  cholecalciferol (VITAMIN D) 1000 UNITS tablet Take 2,000 Units by mouth daily.    [provider]  cilostazol (PLETAL) 100 MG tablet Take 100 mg by mouth daily.      [provider]  cloNIDine (CATAPRES) 0.1 MG tablet Take 0.1 mg by mouth 2 (two) times daily.      [provider]  glipiZIDE (GLUCOTROL) 5 MG tablet Take 5 mg by mouth daily. Take one half tab at noon    [provider]  glipiZIDE-metformin (METAGLIP) 5-500 MG per tablet Take 1 tablet by mouth 2 (two) times daily before a meal.      [provider]  labetalol (NORMODYNE) 200 MG tablet Take 200 mg by mouth 2 (two) times daily.      [provider]  loratadine (CLARITIN) 10 MG tablet Take 10 mg by mouth Daily. 05/28/11   [provider]  simvastatin (ZOCOR) 20 MG tablet Take 20 mg by mouth at bedtime.      [provider]  sitaGLIPtin (JANUVIA) 100 MG tablet Take  100 mg by mouth daily.      [provider]  valsartan-hydrochlorothiazide (DIOVAN-HCT) 320-12.5 MG per tablet Take 1 tablet by mouth daily.      [provider]     Vital Signs: BP (!) 155/116    Pulse 73    Temp 99.7 F (37.6 C) (Axillary)    Resp 20    Ht 5\' 1"  (1.549 m)    Wt 102 lb 4.7 oz (46.4 kg)    SpO2 100%    BMI 19.33 kg/m   Physical Exam Vitals signs and nursing note reviewed.  Constitutional:      General: She is not in acute distress.    Comments: Intubated with sedation.  Pulmonary:     Effort: Pulmonary effort is normal. No respiratory distress.     Comments: Intubated with sedation. Skin:    General: Skin is warm and dry.     Comments: Left groin incision soft with sheath in place, no active bleeding or hematoma.  Neurological:     Comments: Intubated with sedation. She does not follow commands. PERRL bilaterally. Can spontaneously move RLE, no spontaneous movements of other extremities. Distal pulses palpable with Doppler on left, no pulse palpable on right (same as prior to procedure).  Psychiatric:     Comments: Intubated with sedation.     Imaging: Ct Angio Head W Or Wo Contrast  Result Date: 11/20/2018 CLINICAL DATA:  Code stroke left-sided weakness EXAM: CT ANGIOGRAPHY HEAD AND NECK  TECHNIQUE: Multidetector CT imaging of the head and neck was performed using the standard protocol during bolus administration of intravenous contrast. Multiplanar CT image reconstructions and MIPs were obtained to evaluate the vascular anatomy. Carotid stenosis measurements (when applicable) are obtained utilizing NASCET criteria, using the distal internal carotid diameter as the denominator. CONTRAST:  79mL OMNIPAQUE IOHEXOL 350 MG/ML SOLN COMPARISON:  CT head today FINDINGS: CTA NECK FINDINGS Aortic arch: Atherosclerotic aortic arch and proximal great vessels. Proximal great vessels patent. Mild stenosis proximal left subclavian artery due to atherosclerotic  disease. Right carotid system: Right common carotid artery widely patent. Prior carotid endarterectomy on the right 2008. Bifurcation widely patent. No significant right carotid stenosis. Left carotid system: Scattered atherosclerotic calcification left common carotid artery. Severe atherosclerotic disease at the bifurcation. Severe stenosis at the origin of the external carotid artery. Occlusion of the left internal carotid artery. Note is made of 95% stenosis left internal carotid artery on catheter angiogram 07/08/2004. Vertebral arteries: Left vertebral artery dominant. Calcific plaque and moderate stenosis at the origin of the left vertebral artery. Remainder of the left vertebral artery is patent to the basilar. Right vertebral artery is occluded at the origin with reconstitution of a small vessel at approximately C5. This vessel is patent to the basilar however there is moderate stenosis distal right vertebral artery. Skeleton: No acute skeletal abnormality. Other neck: Negative for mass or adenopathy. Upper chest: Mild apical scarring and emphysema. No acute abnormality. Review of the MIP images confirms the above findings CTA HEAD FINDINGS Anterior circulation: Extensive atherosclerotic disease and moderate stenosis right cavernous carotid. Occlusion distal right M1 with limited flow in right MCA branches. Anterior cerebral arteries patent bilaterally. Left internal carotid artery is occluded through the cavernous segment. Left anterior cerebral artery patent. Left MCA patent through collateral flow through the anterior communicating artery. No significant left MCA stenosis. Posterior circulation: Left vertebral artery widely patent to the basilar. Left PICA patent. Small right vertebral artery is significantly stenotic in the V4 segment with minimal contribution to the basilar. Basilar is patent. Posterior cerebral arteries patent bilaterally without stenosis. Venous sinuses: Normal enhancement Anatomic  variants: None Review of the MIP images confirms the above findings IMPRESSION: 1. Occlusion right M1 segment distally with poor flow in the right M2 and M3 branches. 2. Postop right carotid endarterectomy 2008. Right carotid bifurcation widely patent 3. Severe atherosclerotic disease left carotid bifurcation with occlusion of the left internal carotid artery. 4. Left anterior and left middle cerebral arteries appear patent without significant stenosis through collateral flow through the anterior communicating artery. 5. Posterior circulation intact 6. These results were called by telephone at the time of interpretation on 11/20/2018 at 3:54 Pm to Dr. Roland Rack , who verbally acknowledged these results. Electronically Signed   By: Franchot Gallo M.D.   On: 11/20/2018 16:06   Ct Angio Neck W Or Wo Contrast  Result Date: 11/20/2018 CLINICAL DATA:  Code stroke left-sided weakness EXAM: CT ANGIOGRAPHY HEAD AND NECK TECHNIQUE: Multidetector CT imaging of the head and neck was performed using the standard protocol during bolus administration of intravenous contrast. Multiplanar CT image reconstructions and MIPs were obtained to evaluate the vascular anatomy. Carotid stenosis measurements (when applicable) are obtained utilizing NASCET criteria, using the distal internal carotid diameter as the denominator. CONTRAST:  59mL OMNIPAQUE IOHEXOL 350 MG/ML SOLN COMPARISON:  CT head today FINDINGS: CTA NECK FINDINGS Aortic arch: Atherosclerotic aortic arch and proximal great vessels. Proximal great vessels patent. Mild stenosis proximal left subclavian  artery due to atherosclerotic disease. Right carotid system: Right common carotid artery widely patent. Prior carotid endarterectomy on the right 2008. Bifurcation widely patent. No significant right carotid stenosis. Left carotid system: Scattered atherosclerotic calcification left common carotid artery. Severe atherosclerotic disease at the bifurcation. Severe  stenosis at the origin of the external carotid artery. Occlusion of the left internal carotid artery. Note is made of 95% stenosis left internal carotid artery on catheter angiogram 07/08/2004. Vertebral arteries: Left vertebral artery dominant. Calcific plaque and moderate stenosis at the origin of the left vertebral artery. Remainder of the left vertebral artery is patent to the basilar. Right vertebral artery is occluded at the origin with reconstitution of a small vessel at approximately C5. This vessel is patent to the basilar however there is moderate stenosis distal right vertebral artery. Skeleton: No acute skeletal abnormality. Other neck: Negative for mass or adenopathy. Upper chest: Mild apical scarring and emphysema. No acute abnormality. Review of the MIP images confirms the above findings CTA HEAD FINDINGS Anterior circulation: Extensive atherosclerotic disease and moderate stenosis right cavernous carotid. Occlusion distal right M1 with limited flow in right MCA branches. Anterior cerebral arteries patent bilaterally. Left internal carotid artery is occluded through the cavernous segment. Left anterior cerebral artery patent. Left MCA patent through collateral flow through the anterior communicating artery. No significant left MCA stenosis. Posterior circulation: Left vertebral artery widely patent to the basilar. Left PICA patent. Small right vertebral artery is significantly stenotic in the V4 segment with minimal contribution to the basilar. Basilar is patent. Posterior cerebral arteries patent bilaterally without stenosis. Venous sinuses: Normal enhancement Anatomic variants: None Review of the MIP images confirms the above findings IMPRESSION: 1. Occlusion right M1 segment distally with poor flow in the right M2 and M3 branches. 2. Postop right carotid endarterectomy 2008. Right carotid bifurcation widely patent 3. Severe atherosclerotic disease left carotid bifurcation with occlusion of the left  internal carotid artery. 4. Left anterior and left middle cerebral arteries appear patent without significant stenosis through collateral flow through the anterior communicating artery. 5. Posterior circulation intact 6. These results were called by telephone at the time of interpretation on 11/20/2018 at 3:54 Pm to Dr. Roland Rack , who verbally acknowledged these results. Electronically Signed   By: Franchot Gallo M.D.   On: 11/20/2018 16:06   Portable Chest Xray  Result Date: 11/21/2018 CLINICAL DATA:  Status post endotracheal intubation EXAM: PORTABLE CHEST 1 VIEW COMPARISON:  11/20/2018 FINDINGS: Cardiac shadow is enlarged but stable. Aortic calcifications are again noted. Endotracheal tube has been repositioned in satisfactory location. The lungs are well aerated without focal infiltrate or sizable effusion. No acute bony abnormality is noted. IMPRESSION: Endotracheal tube in satisfactory position. No acute abnormality noted. Electronically Signed   By: Inez Catalina M.D.   On: 11/21/2018 07:35   Dg Chest Port 1 View  Result Date: 11/20/2018 CLINICAL DATA:  Intubation. EXAM: PORTABLE CHEST 1 VIEW COMPARISON:  11/16/2018 FINDINGS: The endotracheal tube terminates approximately 1.5 cm above the carina. Heart size remains enlarged. There is generalized volume overload without overt pulmonary edema. There is pleuroparenchymal scarring at the lung apices. There are trace bilateral pleural effusions. IMPRESSION: 1. The endotracheal tube is borderline low. Repositioning should be considered. 2. Cardiomegaly with mild vascular congestion. Electronically Signed   By: Constance Holster M.D.   On: 11/20/2018 20:49   Ct Head Code Stroke Wo Contrast  Result Date: 11/20/2018 CLINICAL DATA:  Code stroke. Altered level of consciousness. Stroke. ESRD. Carotid  endarterectomy on the right 2008 EXAM: CT HEAD WITHOUT CONTRAST TECHNIQUE: Contiguous axial images were obtained from the base of the skull through the  vertex without intravenous contrast. COMPARISON:  None. FINDINGS: Brain: Generalized atrophy without hydrocephalus. Chronic infarct left occipital lobe. Small chronic infarct right cerebellum. Negative for acute infarct, hemorrhage, mass Vascular: Subtle hyperdensity right M1 bifurcation in the sylvian fissure. Skull: Negative Sinuses/Orbits: Negative Other: None ASPECTS ([place] Stroke Program Early CT Score) - Ganglionic level infarction (caudate, lentiform nuclei, internal capsule, insula, M1-M3 cortex): 7 - Supraganglionic infarction (M4-M6 cortex): 3 Total score (0-10 with 10 being normal): 10 IMPRESSION: 1. Subtle hyperdense right MCA. There is occlusion of the right distal M1 on CTA today. 2. ASPECTS is 10 3. No acute infarct or hemorrhage 4. These results were called by telephone at the time of interpretation on 11/20/2018 at 3:54 pm to Dr. Leonel Ramsay , who verbally acknowledged these results. Electronically Signed   By: Franchot Gallo M.D.   On: 11/20/2018 15:54    Labs:  CBC: Recent Labs    11/20/18 1518 11/20/18 1523 11/20/18 2053 11/21/18 0538  WBC 6.5  --   --  6.2  HGB 12.0 13.9 10.5* 9.2*  HCT 38.0 41.0 31.0* 29.0*  PLT 234  --   --  224    COAGS: Recent Labs    11/20/18 1518  INR 1.0  APTT 28    BMP: Recent Labs    11/20/18 1518 11/20/18 1523 11/20/18 2053 11/21/18 0538  NA 140 141 142 140  K 3.9 3.9 4.4 3.9  CL 106 108  --  112*  CO2 20*  --   --  19*  GLUCOSE 167* 163*  --  108*  BUN 20 24*  --  15  CALCIUM 9.2  --   --  8.3*  CREATININE 1.06* 1.00  --  0.98  GFRNONAA 51*  --   --  56*  GFRAA 59*  --   --  >60    LIVER FUNCTION TESTS: Recent Labs    11/20/18 1518  BILITOT 0.9  AST 19  ALT 32  ALKPHOS 66  PROT 6.7  ALBUMIN 3.8    Assessment and Plan:  Right MCA distal M1 segment occlusion s/p cerebral arteriogram with unsuccessful mechanical thrombectomy (due to underlying hard ICAD) and unsuccessful intracranial rescue stenting (due to  vascular tortuosity) achieving a TICI 2A revascularization 11/20/2018 by Dr. Estanislado Pandy. Patient's condition stable- remains intubated/sedated, does not follow commands, only moving RLE spontaneously. Left groin incision stable with sheath in place- plan for sheath removal today. Plan for MR brain today following sheath removal. Further plans per neurology/CCM- appreciate and agree with management. NIR to follow.   Electronically Signed: Earley Abide, PA-C 11/21/2018, 9:54 AM   I spent a total of 25 Minutes at the the patient's bedside AND on the patient's hospital floor or unit, greater than 50% of which was counseling/coordinating care for right MCA distal M1 segment occlusion.

## 2018-11-21 NOTE — Progress Notes (Signed)
Initial Nutrition Assessment  DOCUMENTATION CODES:   Not applicable  INTERVENTION:  Recommend Vital 1.5 @ 40  TF regimen and propofol at current rate providing 1644 total kcal/day (100 % of kcal needs) 65 grams protein 730 ml free water  NUTRITION DIAGNOSIS:   Inadequate oral intake related to inability to eat as evidenced by NPO status.   GOAL:   Provide needs based on ASPEN/SCCM guidelines   MONITOR:   Vent status, I & O's, Labs, Weight trends  REASON FOR ASSESSMENT:   Ventilator    ASSESSMENT:  RD working remotely.  78 year old female with history of CAD, chonic left ICA occlusion and known right ICA stenosis, T2DM, HTN, HLD, MI, ESRD. Patient found laying in bed by her son with rightward gaze, left-sided facial droop, and dysarthric speech stating that she was unable to get up. Patient admitted with thrombotic R MCA CVA secondary to M1 occlusion, chronic bilateral ICA stenosis  8/31 - IR for emergent thrombectomy; intubated Unsuccessful revascularization of right MCA; unsuccessful intracranial rescue stenting   Spoke with RN this morning; MRI at 11, weaning trials possible this afternoon following repeat CT.   Need for diuresis for the increasing O2 requirements and to facilitate extubation per MD note this afternoon.  Currently patient sedated, non-alert, non-responsive to noise, minimal response to pain on rt side only; spontaneously movement of rt leg   BP: 188/77 MAP: 120  Patient is currently intubated on ventilator support MV: 26 L/min Temp (24hrs), Avg:98.8 F (37.1 C), Min:97.5 F (36.4 C), Max:100 F (37.8 C)  Propofol:  7.73 ml/hr provides 204 kcal daily    I/O: +474 ml since admit UOP: 1050 ml x 24 hrs   NUTRITION - FOCUSED PHYSICAL EXAM: Unable to complete at this time   Diet Order:   Diet Order            Diet NPO time specified  Diet effective now              EDUCATION NEEDS:   No education needs have been identified at this  time  Skin:  Skin Assessment: Reviewed RN Assessment  Last BM:  9/1  Height:   Ht Readings from Last 1 Encounters:  11/20/18 5\' 1"  (1.549 m)    Weight:   Wt Readings from Last 1 Encounters:  11/20/18 46.4 kg    Ideal Body Weight:  47.7 kg  BMI:  Body mass index is 19.33 kg/m.  Estimated Nutritional Needs:   Kcal:  1650  Protein:  65-74  Fluid:  >/=1.6L/day   Lajuan Lines, RD, LDN  After Hours/Weekend Pager: [phone]

## 2018-11-21 NOTE — Progress Notes (Signed)
STROKE TEAM PROGRESS NOTE   INTERVAL HISTORY I have personally reviewed HPI. She presented with right distal M1 occlusion and was treated with iv TPA followed by unsuccessful revascularization of occluded right M1 occlusion.  She is still intubated for respiratory failure but has respiratory effort.  She still has right groin sheath.  Blood pressure is adequately controlled on drip.  She can be aroused and follows simple commands on the right side but remains plegic on the left with right gaze deviation  Vitals:   11/21/18 0735 11/21/18 0800 11/21/18 0830 11/21/18 0900  BP:  (!) 126/97 (!) 157/63 (!) 155/116  Pulse:  65 75 73  Resp:  16 16 20   Temp:  99.7 F (37.6 C)    TempSrc:  Axillary    SpO2: 100% 100% 100% 100%  Weight:      Height:        CBC:  Recent Labs  Lab 11/20/18 1518  11/20/18 2053 11/21/18 0538  WBC 6.5  --   --  6.2  NEUTROABS 3.0  --   --  4.6  HGB 12.0   < > 10.5* 9.2*  HCT 38.0   < > 31.0* 29.0*  MCV 89.4  --   --  88.7  PLT 234  --   --  224   < > = values in this interval not displayed.    Basic Metabolic Panel:  Recent Labs  Lab 11/20/18 1518 11/20/18 1523 11/20/18 1955 11/20/18 2053 11/21/18 0538  NA 140 141  --  142 140  K 3.9 3.9  --  4.4 3.9  CL 106 108  --   --  112*  CO2 20*  --   --   --  19*  GLUCOSE 167* 163*  --   --  108*  BUN 20 24*  --   --  15  CREATININE 1.06* 1.00  --   --  0.98  CALCIUM 9.2  --   --   --  8.3*  MG  --   --  1.5*  --   --    Lipid Panel:     Component Value Date/Time   CHOL 124 11/21/2018 0538   TRIG 66 11/21/2018 0538   HDL 39 (L) 11/21/2018 0538   CHOLHDL 3.2 11/21/2018 0538   VLDL 13 11/21/2018 0538   LDLCALC 72 11/21/2018 0538   HgbA1c:  Lab Results  Component Value Date   HGBA1C 6.6 (H) 11/21/2018   Urine Drug Screen: No results found for: LABOPIA, COCAINSCRNUR, LABBENZ, AMPHETMU, THCU, LABBARB  Alcohol Level No results found for: ETH  IMAGING Ct Angio Head W Or Wo Contrast  Result  Date: 11/20/2018 CLINICAL DATA:  Code stroke left-sided weakness EXAM: CT ANGIOGRAPHY HEAD AND NECK TECHNIQUE: Multidetector CT imaging of the head and neck was performed using the standard protocol during bolus administration of intravenous contrast. Multiplanar CT image reconstructions and MIPs were obtained to evaluate the vascular anatomy. Carotid stenosis measurements (when applicable) are obtained utilizing NASCET criteria, using the distal internal carotid diameter as the denominator. CONTRAST:  91mL OMNIPAQUE IOHEXOL 350 MG/ML SOLN COMPARISON:  CT head today FINDINGS: CTA NECK FINDINGS Aortic arch: Atherosclerotic aortic arch and proximal great vessels. Proximal great vessels patent. Mild stenosis proximal left subclavian artery due to atherosclerotic disease. Right carotid system: Right common carotid artery widely patent. Prior carotid endarterectomy on the right 2008. Bifurcation widely patent. No significant right carotid stenosis. Left carotid system: Scattered atherosclerotic calcification left common carotid  artery. Severe atherosclerotic disease at the bifurcation. Severe stenosis at the origin of the external carotid artery. Occlusion of the left internal carotid artery. Note is made of 95% stenosis left internal carotid artery on catheter angiogram 07/08/2004. Vertebral arteries: Left vertebral artery dominant. Calcific plaque and moderate stenosis at the origin of the left vertebral artery. Remainder of the left vertebral artery is patent to the basilar. Right vertebral artery is occluded at the origin with reconstitution of a small vessel at approximately C5. This vessel is patent to the basilar however there is moderate stenosis distal right vertebral artery. Skeleton: No acute skeletal abnormality. Other neck: Negative for mass or adenopathy. Upper chest: Mild apical scarring and emphysema. No acute abnormality. Review of the MIP images confirms the above findings CTA HEAD FINDINGS Anterior  circulation: Extensive atherosclerotic disease and moderate stenosis right cavernous carotid. Occlusion distal right M1 with limited flow in right MCA branches. Anterior cerebral arteries patent bilaterally. Left internal carotid artery is occluded through the cavernous segment. Left anterior cerebral artery patent. Left MCA patent through collateral flow through the anterior communicating artery. No significant left MCA stenosis. Posterior circulation: Left vertebral artery widely patent to the basilar. Left PICA patent. Small right vertebral artery is significantly stenotic in the V4 segment with minimal contribution to the basilar. Basilar is patent. Posterior cerebral arteries patent bilaterally without stenosis. Venous sinuses: Normal enhancement Anatomic variants: None Review of the MIP images confirms the above findings IMPRESSION: 1. Occlusion right M1 segment distally with poor flow in the right M2 and M3 branches. 2. Postop right carotid endarterectomy 2008. Right carotid bifurcation widely patent 3. Severe atherosclerotic disease left carotid bifurcation with occlusion of the left internal carotid artery. 4. Left anterior and left middle cerebral arteries appear patent without significant stenosis through collateral flow through the anterior communicating artery. 5. Posterior circulation intact 6. These results were called by telephone at the time of interpretation on 11/20/2018 at 3:54 Pm to Dr. Roland Rack , who verbally acknowledged these results. Electronically Signed   By: Franchot Gallo M.D.   On: 11/20/2018 16:06   Ct Angio Neck W Or Wo Contrast  Result Date: 11/20/2018 CLINICAL DATA:  Code stroke left-sided weakness EXAM: CT ANGIOGRAPHY HEAD AND NECK TECHNIQUE: Multidetector CT imaging of the head and neck was performed using the standard protocol during bolus administration of intravenous contrast. Multiplanar CT image reconstructions and MIPs were obtained to evaluate the vascular  anatomy. Carotid stenosis measurements (when applicable) are obtained utilizing NASCET criteria, using the distal internal carotid diameter as the denominator. CONTRAST:  35mL OMNIPAQUE IOHEXOL 350 MG/ML SOLN COMPARISON:  CT head today FINDINGS: CTA NECK FINDINGS Aortic arch: Atherosclerotic aortic arch and proximal great vessels. Proximal great vessels patent. Mild stenosis proximal left subclavian artery due to atherosclerotic disease. Right carotid system: Right common carotid artery widely patent. Prior carotid endarterectomy on the right 2008. Bifurcation widely patent. No significant right carotid stenosis. Left carotid system: Scattered atherosclerotic calcification left common carotid artery. Severe atherosclerotic disease at the bifurcation. Severe stenosis at the origin of the external carotid artery. Occlusion of the left internal carotid artery. Note is made of 95% stenosis left internal carotid artery on catheter angiogram 07/08/2004. Vertebral arteries: Left vertebral artery dominant. Calcific plaque and moderate stenosis at the origin of the left vertebral artery. Remainder of the left vertebral artery is patent to the basilar. Right vertebral artery is occluded at the origin with reconstitution of a small vessel at approximately C5. This vessel is  patent to the basilar however there is moderate stenosis distal right vertebral artery. Skeleton: No acute skeletal abnormality. Other neck: Negative for mass or adenopathy. Upper chest: Mild apical scarring and emphysema. No acute abnormality. Review of the MIP images confirms the above findings CTA HEAD FINDINGS Anterior circulation: Extensive atherosclerotic disease and moderate stenosis right cavernous carotid. Occlusion distal right M1 with limited flow in right MCA branches. Anterior cerebral arteries patent bilaterally. Left internal carotid artery is occluded through the cavernous segment. Left anterior cerebral artery patent. Left MCA patent  through collateral flow through the anterior communicating artery. No significant left MCA stenosis. Posterior circulation: Left vertebral artery widely patent to the basilar. Left PICA patent. Small right vertebral artery is significantly stenotic in the V4 segment with minimal contribution to the basilar. Basilar is patent. Posterior cerebral arteries patent bilaterally without stenosis. Venous sinuses: Normal enhancement Anatomic variants: None Review of the MIP images confirms the above findings IMPRESSION: 1. Occlusion right M1 segment distally with poor flow in the right M2 and M3 branches. 2. Postop right carotid endarterectomy 2008. Right carotid bifurcation widely patent 3. Severe atherosclerotic disease left carotid bifurcation with occlusion of the left internal carotid artery. 4. Left anterior and left middle cerebral arteries appear patent without significant stenosis through collateral flow through the anterior communicating artery. 5. Posterior circulation intact 6. These results were called by telephone at the time of interpretation on 11/20/2018 at 3:54 Pm to Dr. Roland Rack , who verbally acknowledged these results. Electronically Signed   By: Franchot Gallo M.D.   On: 11/20/2018 16:06   Portable Chest Xray  Result Date: 11/21/2018 CLINICAL DATA:  Status post endotracheal intubation EXAM: PORTABLE CHEST 1 VIEW COMPARISON:  11/20/2018 FINDINGS: Cardiac shadow is enlarged but stable. Aortic calcifications are again noted. Endotracheal tube has been repositioned in satisfactory location. The lungs are well aerated without focal infiltrate or sizable effusion. No acute bony abnormality is noted. IMPRESSION: Endotracheal tube in satisfactory position. No acute abnormality noted. Electronically Signed   By: Inez Catalina M.D.   On: 11/21/2018 07:35   Dg Chest Port 1 View  Result Date: 11/20/2018 CLINICAL DATA:  Intubation. EXAM: PORTABLE CHEST 1 VIEW COMPARISON:  11/16/2018 FINDINGS: The  endotracheal tube terminates approximately 1.5 cm above the carina. Heart size remains enlarged. There is generalized volume overload without overt pulmonary edema. There is pleuroparenchymal scarring at the lung apices. There are trace bilateral pleural effusions. IMPRESSION: 1. The endotracheal tube is borderline low. Repositioning should be considered. 2. Cardiomegaly with mild vascular congestion. Electronically Signed   By: Constance Holster M.D.   On: 11/20/2018 20:49   Ct Head Code Stroke Wo Contrast  Result Date: 11/20/2018 CLINICAL DATA:  Code stroke. Altered level of consciousness. Stroke. ESRD. Carotid endarterectomy on the right 2008 EXAM: CT HEAD WITHOUT CONTRAST TECHNIQUE: Contiguous axial images were obtained from the base of the skull through the vertex without intravenous contrast. COMPARISON:  None. FINDINGS: Brain: Generalized atrophy without hydrocephalus. Chronic infarct left occipital lobe. Small chronic infarct right cerebellum. Negative for acute infarct, hemorrhage, mass Vascular: Subtle hyperdensity right M1 bifurcation in the sylvian fissure. Skull: Negative Sinuses/Orbits: Negative Other: None ASPECTS ([place] Stroke Program Early CT Score) - Ganglionic level infarction (caudate, lentiform nuclei, internal capsule, insula, M1-M3 cortex): 7 - Supraganglionic infarction (M4-M6 cortex): 3 Total score (0-10 with 10 being normal): 10 IMPRESSION: 1. Subtle hyperdense right MCA. There is occlusion of the right distal M1 on CTA today. 2. ASPECTS is 10 3. No  acute infarct or hemorrhage 4. These results were called by telephone at the time of interpretation on 11/20/2018 at 3:54 pm to Dr. Leonel Ramsay , who verbally acknowledged these results. Electronically Signed   By: Franchot Gallo M.D.   On: 11/20/2018 15:54   Cerebral Angiogram 11/20/2018 1.occluded Rt MCA distal M1 seg. Unsuccessful revascularization  Despite x 2 passes with 65mm xc 40mm embotrap retriever device and penumbra aspiration    due to underlying hard ICAD. Unsuccessful intracranial rescue stenting dut vascular tortuosity.  PHYSICAL EXAM Elderly African-American lady who is sedated intubated.  She has right groin sheath. . Afebrile. Head is nontraumatic. Neck is supple without bruit.    Cardiac exam no murmur or gallop. Lungs are clear to auscultation. Distal pulses are well felt. Neurological Exam : Patient is sedated and intubated.  She opens eyes to sternal rub.  She has right gaze deviation.  She is not able to follow gaze to the left.  She blinks to threat on the right but not on the left.  Pupils equal reactive.  Fundi not visualized.  She follows simple commands on the right side but not on the left.  She does move right upper and lower extremity against gravity.  She withdraws left lower extremity partially to pain.  Does not withdraw left upper extremity.  Tone is diminished on the left and normal on the right.  Right plantars downgoing left is equivocal. ASSESSMENT/PLAN [name] is a 78 y.o. female with history of myocardial infarction, hypertension, hyperlipidemia, diabetes, carotid artery occlusion on the left, CAD and arthritis presenting with L sided neglect, R eye deviation and L side flaccid. Received tPA 11/20/2018 at 1545. CTA showed R M1 occlusion. Taken to IR.  SBP > 220.   Stroke:   R MCA infarct s/p tPA and unsuccessful IR of R M1 occlusion - embolic secondary to unclear source, possible AF  Code Stroke CT head subtle hyperdense R MCA. No acute abnormality. ASPECTS 10.     CTA head & neck R M! Occlusion w/ poor flor R M2 and R M3. R CEA 2008 w/ patent bifurcation. Severe L ICA bifurcation atherosclerosis w/ occlusion L ICA. L ACA and L MCA patent from ACom. Poster circ ok.  Cerebral angio occluded R M1. Unsuccessful revascularization after 2 passes w/ embotrap and penumbra aspiration d/t underlying hard atherosclerosis and vascular tortuosity.  Post IR CT no ICH or mass effect  MRI  pending  2D Echo pending  LDL 72  HgbA1c 6.6  SCDs for VTE prophylaxis  aspirin 81 mg daily and pletal prior to admission, now on No antithrombotic as within 24h of tPA administration.  Resume aspirin if no bleed on follow-up scan  Therapy recommendations:  pending   Disposition:  pending  (lives alone, son checks on daily)  D/c sheath  Acute Respiratory Failure w/ hypoxia  Intubated for IR, not extubated at the end of the procedure d/t tPA administration and sheath left in  CCM on aboard  Possible Atrial Fibrillation  Rhythm post intubation looked like AF to MD  AF not captured on EKG or tele . Continue telemetry monitoring   Carotid Disease  Hx L ICA occlusion  Hypertensive Urgency  SBP > 220 in ED, as high as 222/128  Home meds:  norvasc 10, clonidine 0.1 bid, labetalol 200 bid, valsartan 320-12.5 daily Treated w/ cleviprex, Now off. Sedated. As no revascularization, SBP goal per post tPA protocol x 24h following tPA administration   . BP  stable . Long-term BP goal normotensive  Hyperlipidemia  Home meds:  zocor 20  LDL 72, goal < 70  Resume statin once able to swallow  Continue statin at discharge  Diabetes type II Controlled  Home meds:  Glipizide 5, glipizide-metformin 5-500 bid, Januvia 100  HgbA1c 6.6, goal < 7.0  CBGs  SSI  DB RN consulted - decrease to 1-3 correction scale for phase 1 sensitive ICU orders (done)  Dysphagia . Secondary to stroke . NPO   Other Stroke Risk Factors  Advanced age  Former Cigarette smoker  Coronary artery disease s/p MI    Other Active Problems  R DP and PT not dopplerable prior to or post IR  Acute blood loss anemia 13.9->9.2. monitor.  Hospital day # 1  I have personally obtained history,examined this patient, reviewed notes, independently viewed imaging studies, participated in medical decision making and plan of care.ROS completed by me personally and pertinent positives fully documented   I have made any additions or clarifications directly to the above note.  She presented with right MCA infarct due to right M1 occlusion and was treated with IV TPA with unsuccessful recanalization from attempted mechanical thrombectomy.  Continue ventilatory support for respiratory failure and sedation till arterial groin sheath is removed and patient can sit up.  Maintain strict control of blood pressure as per post TPA and intervention protocol.  Check MRI scan and if no bleed start aspirin.  Prognosis is guarded.  Discussed with Dr. Lynetta Mare critical care medicine.  I also spoke to the patient's son at the bedside after rounds and explained the prognosis and plan for evaluation and treatment and answered questions. This patient is critically ill and at significant risk of neurological worsening, death and care requires constant monitoring of vital signs, hemodynamics,respiratory and cardiac monitoring, extensive review of multiple databases, frequent neurological assessment, discussion with family, other specialists and medical decision making of high complexity.I have made any additions or clarifications directly to the above note.This critical care time does not reflect procedure time, or teaching time or supervisory time of PA/NP/Med Resident etc but could involve care discussion time.  I spent 30 minutes of neurocritical care time  in the care of  this patient.      Alison Contras, MD Medical Director [facility] Pager: [phone] 11/21/2018 4:36 PM   To contact Stroke Continuity provider, please refer to [email or web address] After hours, contact General Neurology

## 2018-11-21 NOTE — Progress Notes (Signed)
NAME:  [name], MRN:  [id number], DOB:  Sep 06, 1940, LOS: 1 ADMISSION DATE:  11/20/2018, CONSULTATION DATE:  11/20/18 REFERRING MD:  Dr. Leonel Ramsay, CHIEF COMPLAINT:  Facial droop   Brief History   78 y.o. F who lives alone who was found in her bed by her son with L-sided neglect and eye deviation to the R.  Last known well was 12:00 on 8/31.  CTA done in the ED and showed R M1 occlusion and pt and family decided to proceed with thrombectomy.  She was intubated pre-procedure and PCCM was consulted. Revascularization was unsuccessful and the patient remains intubated.    History of present illness   [name] is a 78 y.o. F with PMH of CAD and MI, HTN, HL, cardiomyopathy, Type II DM, and bilateral carotid artery stenosis s/p right carotid endarterectomy in 2008 who was found by her son 8/31 in bed with flaccid L side, L facial droop and slurred speech.  She lives independently and he had last spoken to her on the phone around noon today and she seemed normal.  Her home medications include Asa 81mg  and Simvastatin 20mg .   In the ED she was treated with IV TPA, completed at 3:45 pm. CTA head significant for R M1 occlusion and son opted to move forward with thrombectomy.  She was hypertensive and tachycardic with max BP of 222/128, treated with Labetalol 20mg  IV. Glucose was elevated at 167,  and Lab results were otherwise non-actionable .  EKG showed sinus tachycardia with prolonged Qtc 510.  She was intubated and taken to IR.  In IR she had right common carotid arteriogram with unsuccessful revascularization despite 2 passes with embotrap retriever device, unsuccessful intracranial rescue stenting due to vascular tortuosity.  Past Medical History  CAD s/p MI, cardiomyopathy, HTN, HL, carotid stenosis, DM, mitral valve insufficiency, ? A.fib  Significant Hospital Events   8/31 Admission, unsuccessful IR revascularization attempt  Consults:  PCCM  Procedures:  ETT 8/31  L fem  arterial sheath 8/31   Significant Diagnostic Tests:  8/31 CT/ CTA head>Hyperdense R MCA, occlusion of the R distal M1, no hemorrhage, severe atherosclerotic disease with occlusion of the L internal carotid CT head 9/1 >  MRI 9/1 >  Echo 9/1 >   Micro Data:  8/31 Sars-CoV-2 >> neg  Antimicrobials:  None.  Interim history/subjective:  Intubated and sedated on exam. Nursing reports patient has only been moving RLE spontaneously and is only minimally responsive to pain. Nursing has been unable to appreciate lower extremity pulses on exam, and are only able to find a left lower extremity pulse with doppler. Nursing has been unable to find right lower extremity pulse on doppler.   Objective   Blood pressure (!) 155/116, pulse 73, temperature 99.7 F (37.6 C), temperature source Axillary, resp. rate 20, height 5\' 1"  (1.549 m), weight 46.4 kg, SpO2 100 %.    Vent Mode: PRVC FiO2 (%):  [40 %-60 %] 50 % Set Rate:  [16 bmp] 16 bmp Vt Set:  [400 mL] 400 mL PEEP:  [5 cmH20] 5 cmH20 Plateau Pressure:  [13 cmH20-18 cmH20] 18 cmH20   Intake/Output Summary (Last 24 hours) at 11/21/2018 0935 Last data filed at 11/21/2018 0800 Gross per 24 hour  Intake 1580.08 ml  Output 1050 ml  Net 530.08 ml   Filed Weights   11/20/18 1500  Weight: 46.4 kg    Examination: General: Elderly woman, sedated and intubated, lying in bed. Sheath in place at left groin.  HENT: Normocephalic and atraumatic. Anicteral sclera. Neck- Supple, JV appreciated to mandible bilaterally at ~15 degrees Lungs: On volume-control ventillation, RR 16, TV 400 mL, PEEP 5. Lung sounds rhonchorous but with air moving. Symmetric chest wall expansion. Cardiovascular: Regular rate and rhythm. Holosystolic murmur appreciated at left sternal border. No carotid bruits. 1+ radial pulses. Unable to appreciate dorsalis pedis, posterior tibialis pulses on exam. Abdomen: Non-tender, non-distended. Extremities: No lower extremity edema  appreciated, lower extremities distal to ankle are cold bilaterally. MSK: Diffuse muscle wasting with decreased bulk noticed at shoulders and thighs. Increased tone in right extremities, flaccid in left lower extremity and left upper extremity.  Neuro:  Mental status: Sedated, non-alert. Non-responsive to noise, minimal response to pain on right side only. Spontaneously moves right leg.  Reflexes: Brisk patellar and bicep reflexes bilaterally Cranial Nerves: Pupils equal, round, and reactive to light. No response to touch in face bilaterally.   Assessment & Plan:  Thrombotic R MCA CVA secondary to M1 occlusion, Chronic bilateral ICA stenosis - s/p IR revascularization attempt but unfortunately unsuccessful.  - Stroke workup per neuro. - Hold anti-platelet therapy s/p tPA, can start ASA at ~4 pm today, 24 hrs after completion of TPA administration - Cleviprex as needed for goal SBP 120 - 160 per neuro. - F/u on repeat CT, MRI, echo. - Will need PT/OT eval when stable.  Respiratory insufficiency - due to inability to protect the airway in the setting of above. - Full vent support - Daily SBT with hopes of extubation following MRI, repeat CT, and appropriate diuresis discussed below. - Bronchial hygiene.  Hx HTN, HL, Cardiomyopathy (Last cardiology note under Care Everywhere from 07/06/2017 with EF 30-35%, telephone encounter with [facility] shows congestive heart failure exacerbation treated at [facility] on 8/27), CAD s/p MI, ? A.fib per neuro (some discussions with son regarding possible A.fib; however, no documentation.  Not on any anticoagulation). Patient has pulmonary edema on CXR, JVD appreciated to the mandible bilaterally. Will diurese. - Cleviprex PRN for goal SBP 120 - 160 per neuro. - Hold home antihypertensives (Norvasc, Clonidine, Labetalol, Diovan). -Transition Simvastatin to high intensity statin, Rosuvastatin 20mg . - Echo to be completed today as noted above - Diurese with 1x  IV Lasix 40 mg once - Place Foley  Hx DM. - SSI. - Hold home glipizide, metaglip, januvia. - Check A1c.  Best practice:  Diet: NPO Pain/Anxiety/Delirium protocol (if indicated): Propofol ggt / Fentanyl PRN. RASS goal -1. VAP protocol (if indicated): In place. DVT prophylaxis: SCDs in place GI prophylaxis: Protonix Glucose control: Continue insulin sliding scale Mobility: PT/OT Code Status: FULL Family Communication: Son - Writer. [phone] Disposition: Continue ICU care   Labs   CBC: Recent Labs  Lab 11/20/18 1518 11/20/18 1523 11/20/18 2053 11/21/18 0538  WBC 6.5  --   --  6.2  NEUTROABS 3.0  --   --  4.6  HGB 12.0 13.9 10.5* 9.2*  HCT 38.0 41.0 31.0* 29.0*  MCV 89.4  --   --  88.7  PLT 234  --   --  [id number]    Basic Metabolic Panel: Recent Labs  Lab 11/20/18 1518 11/20/18 1523 11/20/18 1955 11/20/18 2053 11/21/18 0538  NA 140 141  --  142 140  K 3.9 3.9  --  4.4 3.9  CL 106 108  --   --  112*  CO2 20*  --   --   --  19*  GLUCOSE 167* 163*  --   --  108*  BUN 20 24*  --   --  15  CREATININE 1.06* 1.00  --   --  0.98  CALCIUM 9.2  --   --   --  8.3*  MG  --   --  1.5*  --   --    GFR: Estimated Creatinine Clearance: 35.8 mL/min (by C-G formula based on SCr of 0.98 mg/dL). Recent Labs  Lab 11/20/18 1518 11/21/18 0538  WBC 6.5 6.2    Liver Function Tests: Recent Labs  Lab 11/20/18 1518  AST 19  ALT 32  ALKPHOS 66  BILITOT 0.9  PROT 6.7  ALBUMIN 3.8   No results for input(s): LIPASE, AMYLASE in the last 168 hours. No results for input(s): AMMONIA in the last 168 hours.  ABG    Component Value Date/Time   PHART 7.372 11/20/2018 2053   PCO2ART 33.1 11/20/2018 2053   PO2ART 57.0 (L) 11/20/2018 2053   HCO3 19.4 (L) 11/20/2018 2053   TCO2 20 (L) 11/20/2018 2053   ACIDBASEDEF 5.0 (H) 11/20/2018 2053   O2SAT 90.0 11/20/2018 2053     Coagulation Profile: Recent Labs  Lab 11/20/18 1518  INR 1.0    Cardiac Enzymes: No results  for input(s): CKTOTAL, CKMB, CKMBINDEX, TROPONINI in the last 168 hours.  HbA1C: Hgb A1c MFr Bld  Date/Time Value Ref Range Status  11/21/2018 05:38 AM 6.6 (H) 4.8 - 5.6 % Final    Comment:    (NOTE) Pre diabetes:          5.7%-6.4% Diabetes:              >6.4% Glycemic control for   <7.0% adults with diabetes   01/06/2007 03:43 PM   Final   5.9 (NOTE)   The ADA recommends the following therapeutic goals for glycemic   control related to Hgb A1C measurement:   Goal of Therapy:   < 7.0% Hgb A1C   Action Suggested:  > 8.0% Hgb A1C   Ref:  Diabetes Care, 22, Suppl. 1, 1999    CBG: Recent Labs  Lab 11/20/18 2022 11/20/18 2306 11/21/18 0323 11/21/18 0343 11/21/18 0808  GLUCAP 156* 178* 39* 97 72    Review of Systems:   Unable to review due to patient's current status.  Past Medical History  She,  has a past medical history of Arthritis, CAD (coronary artery disease), Carotid artery occlusion, Diabetes mellitus, Hyperlipidemia, Hypertension, and Myocardial infarction ([place]).   Surgical History    Past Surgical History:  Procedure Laterality Date  . CAROTID ENDARTERECTOMY  01/06/2007   right - with Dacron patch angioplasty  . HERNIA REPAIR  2012     Social History   reports that she quit smoking about 31 years ago. Her smoking use included cigarettes. She smoked 1.00 pack per day. She has never used smokeless tobacco. She reports that she does not drink alcohol.   Family History   Her family history includes Diabetes in her brother and mother; Heart disease in her brother and mother.   Allergies No Known Allergies   Home Medications  Prior to Admission medications   Medication Sig Start Date End Date Taking? Authorizing Provider  amLODipine (NORVASC) 10 MG tablet Take 10 mg by mouth daily.      [provider]  aspirin EC 81 MG tablet Take 81 mg by mouth daily.      [provider]  cholecalciferol (VITAMIN D) 1000 UNITS tablet Take 2,000 Units by  mouth daily.    [provider]  cilostazol (PLETAL) 100 MG tablet Take 100 mg by mouth daily.      [provider]  cloNIDine (CATAPRES) 0.1 MG tablet Take 0.1 mg by mouth 2 (two) times daily.      [provider]  glipiZIDE (GLUCOTROL) 5 MG tablet Take 5 mg by mouth daily. Take one half tab at noon    [provider]  glipiZIDE-metformin (METAGLIP) 5-500 MG per tablet Take 1 tablet by mouth 2 (two) times daily before a meal.      [provider]  labetalol (NORMODYNE) 200 MG tablet Take 200 mg by mouth 2 (two) times daily.      [provider]  loratadine (CLARITIN) 10 MG tablet Take 10 mg by mouth Daily. 05/28/11   [provider]  simvastatin (ZOCOR) 20 MG tablet Take 20 mg by mouth at bedtime.      [provider]  sitaGLIPtin (JANUVIA) 100 MG tablet Take 100 mg by mouth daily.      [provider]  valsartan-hydrochlorothiazide (DIOVAN-HCT) 320-12.5 MG per tablet Take 1 tablet by mouth daily.      [provider]    Leonides Schanz, MS4  Critical care attending attestation note:  Patient seen and examined and relevant ancillary tests reviewed.  I agree with the assessment and plan of care as outlined by J Plyler, MS IV. This patient was not seen as a shared visit.   Synopsis of assessment and plan:  78 year old woman who presented within TPA window with L sided weakness. Symptoms persisted in spite of TPA. Unable to revascularize M1 occlusion. Patient returned from IR intubated.  Remains critically ill due to respiratory failure requiring mechanical ventilation.   On examination: Frail appearing woman Intubated and mechanically ventilated.  ETT/OGT in place with no pressure ulceration.  Chest clear bilaterally with no ventilator dyssynchrony. Increased O2 requirements since return from MRI. JVP elevated and apex sustained. HS normal. No edema, extremities cool Abdomen soft Moves all limbs  except LUE.  CXR consistent with pulmonary edema.  (personally reviewed) Echo showed LVH and moderate to severe systolic dysfunction (personally reviewed) MRI head showed evidence of completed infarction in right MCA territory with mimimal edema. No shift. (personally reviewed)  ASSESSMENT:  Completed CVA with exam now consistent with location of stroke.  Known HFREF and signs of pulmonary overload. Now needs diuresis for increasing O2 requirements and to facilitate extubation. Move to WUA/SBT in am once patient able to sit up. In meantime titrate FiO2 and increase PEEP to 8 to improve oxygenation.  Start secondary stroke prevention.  CRITICAL CARE Performed by: Kipp Brood   Total critical care time: 50 minutes  Critical care time was exclusive of separately billable procedures and treating other patients.  Critical care was necessary to treat or prevent imminent or life-threatening deterioration.  Critical care was time spent personally by me on the following activities: development of treatment plan with patient and/or surrogate as well as nursing, discussions with consultants, evaluation of patient's response to treatment, examination of patient, obtaining history from patient or surrogate, ordering and performing treatments and interventions, ordering and review of laboratory studies, ordering and review of radiographic studies, pulse oximetry, re-evaluation of patient's condition and participation in multidisciplinary rounds.  Kipp Brood, MD [facility] ICU Physician [place]  Pager: [phone] Mobile: [phone] After hours: [phone].  11/21/2018, 2:14 PM

## 2018-11-21 NOTE — Progress Notes (Signed)
RT NOTE: RT transported patient on 100% Fi02, from 4N17 to MRI and back with RN. No complications and VS stable. RT will continue to monitor.

## 2018-11-21 NOTE — Progress Notes (Signed)
PT Cancellation Note  Patient Details Name: [name] MRN: [id number] DOB: 06/28/1940   Cancelled Treatment:    Reason Eval/Treat Not Completed: Patient not medically ready;Active bedrest order Will follow up as appropriate.   Marguarite Arbour A [name] 11/21/2018, 6:56 AM Wray Kearns, PT, DPT Acute Rehabilitation Services Pager [phone] Office [phone]

## 2018-11-21 NOTE — Progress Notes (Signed)
OT Cancellation Note  Patient Details Name: [name] MRN: [id number] DOB: 1940/05/30   Cancelled Treatment:    Reason Eval/Treat Not Completed: Patient not medically ready;Active bedrest order. Will return as schedule allows. Thank you.  [place], OTR/L Acute Rehab Pager: [phone] Office: [phone] 11/21/2018, 12:12 PM

## 2018-11-21 NOTE — Progress Notes (Signed)
  Echocardiogram 2D Echocardiogram has been performed.  [name] 11/21/2018, 10:56 AM

## 2018-11-21 NOTE — Progress Notes (Signed)
Pt was identified by name and d.o.b. 8Fr sheath was removed from left groin at 1401 hrs. V-pad and manual pressure was applied for 42 min. Hemostasis was achieved at 1443 hrs. Hemostasis and distal pulses were verified by [facility]. Distal pulses were verified by doppler on left leg. V-pad/gauze/tegaderm was applied to left groin site.

## 2018-11-22 ENCOUNTER — Encounter (HOSPITAL_COMMUNITY): Payer: Self-pay | Admitting: Interventional Radiology

## 2018-11-22 LAB — GLUCOSE, CAPILLARY
Glucose-Capillary: 115 mg/dL — ABNORMAL HIGH (ref 70–99)
Glucose-Capillary: 156 mg/dL — ABNORMAL HIGH (ref 70–99)
Glucose-Capillary: 114 mg/dL — ABNORMAL HIGH (ref 70–99)
Glucose-Capillary: 136 mg/dL — ABNORMAL HIGH (ref 70–99)
Glucose-Capillary: 92 mg/dL (ref 70–99)
Glucose-Capillary: 141 mg/dL — ABNORMAL HIGH (ref 70–99)

## 2018-11-22 LAB — CBC
HCT: 29.2 % — ABNORMAL LOW (ref 36.0–46.0)
Hemoglobin: 9.4 g/dL — ABNORMAL LOW (ref 12.0–15.0)
MCH: 27.9 pg (ref 26.0–34.0)
MCHC: 32.2 g/dL (ref 30.0–36.0)
MCV: 86.6 fL (ref 80.0–100.0)
Platelets: 214 10*3/uL (ref 150–400)
RBC: 3.37 MIL/uL — ABNORMAL LOW (ref 3.87–5.11)
RDW: 16.8 % — ABNORMAL HIGH (ref 11.5–15.5)
WBC: 7.9 10*3/uL (ref 4.0–10.5)
nRBC: 0 % (ref 0.0–0.2)

## 2018-11-22 LAB — BASIC METABOLIC PANEL
Anion gap: 13 (ref 5–15)
BUN: 16 mg/dL (ref 8–23)
CO2: 21 mmol/L — ABNORMAL LOW (ref 22–32)
Calcium: 8.9 mg/dL (ref 8.9–10.3)
Chloride: 108 mmol/L (ref 98–111)
Creatinine, Ser: 1.01 mg/dL — ABNORMAL HIGH (ref 0.44–1.00)
GFR calc Af Amer: >60 mL/min (ref >60)
GFR calc non Af Amer: 54 mL/min — ABNORMAL LOW (ref >60)
Glucose, Bld: 136 mg/dL — ABNORMAL HIGH (ref 70–99)
Potassium: 3.7 mmol/L (ref 3.5–5.1)
Sodium: 142 mmol/L (ref 135–145)

## 2018-11-22 LAB — TRIGLYCERIDES: Triglycerides: 122 mg/dL (ref <150)

## 2018-11-22 MED ORDER — CHLORHEXIDINE GLUCONATE 0.12 % MT SOLN
15.0000 mL | Freq: Two times a day (BID) | OROMUCOSAL | Status: DC
  Administered 2018-11-23 – 2018-12-02 (×19): 15 mL via OROMUCOSAL
  Filled 2018-11-22 (×16): qty 15

## 2018-11-22 MED ORDER — ORAL CARE MOUTH RINSE
15.0000 mL | Freq: Two times a day (BID) | OROMUCOSAL | Status: DC
  Administered 2018-11-23 – 2018-12-02 (×19): 15 mL via OROMUCOSAL

## 2018-11-22 MED ORDER — METOPROLOL TARTRATE 5 MG/5ML IV SOLN
5.0000 mg | Freq: Once | INTRAVENOUS | Status: AC
  Administered 2018-11-22: 5 mg via INTRAVENOUS

## 2018-11-22 MED ORDER — ASPIRIN 81 MG PO CHEW: 81.0000 mg | CHEWABLE_TABLET | Freq: Every day | ORAL | Status: DC

## 2018-11-22 MED ORDER — METOPROLOL TARTRATE 5 MG/5ML IV SOLN
INTRAVENOUS | Status: AC
  Filled 2018-11-22: qty 5

## 2018-11-22 NOTE — Progress Notes (Addendum)
PT Cancellation Note  Patient Details Name: [name] MRN: [id number] DOB: December 21, 1940   Cancelled Treatment:    Reason Eval/Treat Not Completed: Patient not medically ready (per RN, attempting to wean pt, not medically appropriate for therapy currently).  Ellamae Sia, PT, DPT Acute Rehabilitation Services Pager [phone] Office [phone]    Willy Eddy 11/22/2018, 10:03 AM

## 2018-11-22 NOTE — Progress Notes (Signed)
Referring Physician(s): Code Stroke- Greta Doom  Supervising Physician: Luanne Bras  Patient Status:  [facility] - In-pt  Chief Complaint: None- intubated with sedation  Subjective:  Right MCA distal M1 segment occlusion s/p cerebral arteriogram with unsuccessful mechanical thrombectomy (due to underlying hard ICAD) and unsuccessful intracranial rescue stenting (due to vascular tortuosity) achieving a TICI 2A revascularization 11/20/2018 by Dr. Estanislado Pandy. Patient sitting in bed intubated with sedation. She follows commands of right side. Can spontaneously move right side to command, LLE withdraws from pain but no spontaneous movements of LUE. Left groin incision c/d/i.  MR brain 11/21/2018: 1. 8 cm confluent region of infarction in the right middle cerebral artery territory affecting the insula and frontoparietal brain. Mild swelling but no evidence of hemorrhage at this time or of significant mass effect or any shift. 2. Old infarctions within the right cerebellum, left occipital lobe and at the left frontoparietal vertex.   Allergies: Patient has no known allergies.  Medications: Prior to Admission medications   Medication Sig Start Date End Date Taking? Authorizing Provider  aspirin EC 81 MG tablet Take 81 mg by mouth daily.     Yes [provider]  cholecalciferol (VITAMIN D) 1000 UNITS tablet Take 2,000 Units by mouth daily.   Yes [provider]  cilostazol (PLETAL) 100 MG tablet Take 100 mg by mouth 2 (two) times daily.    Yes [provider]  cloNIDine (CATAPRES) 0.1 MG tablet Take 0.1 mg by mouth at bedtime.    Yes [provider]  dicyclomine (BENTYL) 10 MG capsule Take 10 mg by mouth 4 (four) times daily as needed for spasms.   Yes [provider]  furosemide (LASIX) 20 MG tablet Take 20 mg by mouth every other day.   Yes [provider]  labetalol (NORMODYNE) 200 MG tablet Take 400 mg by mouth 2 (two)  times daily.    Yes [provider]  loratadine (CLARITIN) 10 MG tablet Take 10 mg by mouth Daily. 05/28/11  Yes [provider]  metFORMIN (GLUCOPHAGE) 500 MG tablet Take 500 mg by mouth 2 (two) times daily with a meal.   Yes [provider]  omeprazole (PRILOSEC) 20 MG capsule Take 20 mg by mouth daily.   Yes [provider]  Pancrelipase, Lip-Prot-Amyl, (ZENPEP) 40000-126000 units CPEP Take 1-2 capsules by mouth See admin instructions. Take 2 capsules by mouth three times daily with meals and 1 capsule as needed twice daily with snacks.   Yes [provider]  sacubitril-valsartan (ENTRESTO) 49-51 MG Take 1 tablet by mouth 2 (two) times daily.   Yes [provider]  simvastatin (ZOCOR) 20 MG tablet Take 20 mg by mouth at bedtime.     Yes [provider]     Vital Signs: BP (!) 156/64    Pulse 90    Temp 99.9 F (37.7 C) (Axillary)    Resp (!) 25    Ht 5\' 1"  (1.549 m)    Wt 102 lb 4.7 oz (46.4 kg)    SpO2 100%    BMI 19.33 kg/m   Physical Exam Vitals signs and nursing note reviewed.  Constitutional:      General: She is not in acute distress.    Appearance: Normal appearance.     Comments: Intubated with sedation.  Pulmonary:     Effort: Pulmonary effort is normal. No respiratory distress.     Comments: Intubated with sedation. Skin:    General: Skin is warm  and dry.     Comments: Left groin incision soft without active bleeding or hematoma.  Neurological:     Comments: Intubated with sedation. She responds to voice and follows commands of right side. PERRL bilaterally. Can spontaneously move right side to command, LLE withdraws from pain but no spontaneous movements of LUE. Distal pulses palpable with Doppler on left, no pulse palpable on right (same as prior to procedure).   Psychiatric:     Comments: Intubated with sedation.     Imaging: Ct Angio Head W Or Wo Contrast  Result Date: 11/20/2018 CLINICAL DATA:   Code stroke left-sided weakness EXAM: CT ANGIOGRAPHY HEAD AND NECK TECHNIQUE: Multidetector CT imaging of the head and neck was performed using the standard protocol during bolus administration of intravenous contrast. Multiplanar CT image reconstructions and MIPs were obtained to evaluate the vascular anatomy. Carotid stenosis measurements (when applicable) are obtained utilizing NASCET criteria, using the distal internal carotid diameter as the denominator. CONTRAST:  7mL OMNIPAQUE IOHEXOL 350 MG/ML SOLN COMPARISON:  CT head today FINDINGS: CTA NECK FINDINGS Aortic arch: Atherosclerotic aortic arch and proximal great vessels. Proximal great vessels patent. Mild stenosis proximal left subclavian artery due to atherosclerotic disease. Right carotid system: Right common carotid artery widely patent. Prior carotid endarterectomy on the right 2008. Bifurcation widely patent. No significant right carotid stenosis. Left carotid system: Scattered atherosclerotic calcification left common carotid artery. Severe atherosclerotic disease at the bifurcation. Severe stenosis at the origin of the external carotid artery. Occlusion of the left internal carotid artery. Note is made of 95% stenosis left internal carotid artery on catheter angiogram 07/08/2004. Vertebral arteries: Left vertebral artery dominant. Calcific plaque and moderate stenosis at the origin of the left vertebral artery. Remainder of the left vertebral artery is patent to the basilar. Right vertebral artery is occluded at the origin with reconstitution of a small vessel at approximately C5. This vessel is patent to the basilar however there is moderate stenosis distal right vertebral artery. Skeleton: No acute skeletal abnormality. Other neck: Negative for mass or adenopathy. Upper chest: Mild apical scarring and emphysema. No acute abnormality. Review of the MIP images confirms the above findings CTA HEAD FINDINGS Anterior circulation: Extensive  atherosclerotic disease and moderate stenosis right cavernous carotid. Occlusion distal right M1 with limited flow in right MCA branches. Anterior cerebral arteries patent bilaterally. Left internal carotid artery is occluded through the cavernous segment. Left anterior cerebral artery patent. Left MCA patent through collateral flow through the anterior communicating artery. No significant left MCA stenosis. Posterior circulation: Left vertebral artery widely patent to the basilar. Left PICA patent. Small right vertebral artery is significantly stenotic in the V4 segment with minimal contribution to the basilar. Basilar is patent. Posterior cerebral arteries patent bilaterally without stenosis. Venous sinuses: Normal enhancement Anatomic variants: None Review of the MIP images confirms the above findings IMPRESSION: 1. Occlusion right M1 segment distally with poor flow in the right M2 and M3 branches. 2. Postop right carotid endarterectomy 2008. Right carotid bifurcation widely patent 3. Severe atherosclerotic disease left carotid bifurcation with occlusion of the left internal carotid artery. 4. Left anterior and left middle cerebral arteries appear patent without significant stenosis through collateral flow through the anterior communicating artery. 5. Posterior circulation intact 6. These results were called by telephone at the time of interpretation on 11/20/2018 at 3:54 Pm to Dr. Roland Rack , who verbally acknowledged these results. Electronically Signed   By: Franchot Gallo M.D.   On: 11/20/2018 16:06  Ct Angio Neck W Or Wo Contrast  Result Date: 11/20/2018 CLINICAL DATA:  Code stroke left-sided weakness EXAM: CT ANGIOGRAPHY HEAD AND NECK TECHNIQUE: Multidetector CT imaging of the head and neck was performed using the standard protocol during bolus administration of intravenous contrast. Multiplanar CT image reconstructions and MIPs were obtained to evaluate the vascular anatomy. Carotid stenosis  measurements (when applicable) are obtained utilizing NASCET criteria, using the distal internal carotid diameter as the denominator. CONTRAST:  29mL OMNIPAQUE IOHEXOL 350 MG/ML SOLN COMPARISON:  CT head today FINDINGS: CTA NECK FINDINGS Aortic arch: Atherosclerotic aortic arch and proximal great vessels. Proximal great vessels patent. Mild stenosis proximal left subclavian artery due to atherosclerotic disease. Right carotid system: Right common carotid artery widely patent. Prior carotid endarterectomy on the right 2008. Bifurcation widely patent. No significant right carotid stenosis. Left carotid system: Scattered atherosclerotic calcification left common carotid artery. Severe atherosclerotic disease at the bifurcation. Severe stenosis at the origin of the external carotid artery. Occlusion of the left internal carotid artery. Note is made of 95% stenosis left internal carotid artery on catheter angiogram 07/08/2004. Vertebral arteries: Left vertebral artery dominant. Calcific plaque and moderate stenosis at the origin of the left vertebral artery. Remainder of the left vertebral artery is patent to the basilar. Right vertebral artery is occluded at the origin with reconstitution of a small vessel at approximately C5. This vessel is patent to the basilar however there is moderate stenosis distal right vertebral artery. Skeleton: No acute skeletal abnormality. Other neck: Negative for mass or adenopathy. Upper chest: Mild apical scarring and emphysema. No acute abnormality. Review of the MIP images confirms the above findings CTA HEAD FINDINGS Anterior circulation: Extensive atherosclerotic disease and moderate stenosis right cavernous carotid. Occlusion distal right M1 with limited flow in right MCA branches. Anterior cerebral arteries patent bilaterally. Left internal carotid artery is occluded through the cavernous segment. Left anterior cerebral artery patent. Left MCA patent through collateral flow through  the anterior communicating artery. No significant left MCA stenosis. Posterior circulation: Left vertebral artery widely patent to the basilar. Left PICA patent. Small right vertebral artery is significantly stenotic in the V4 segment with minimal contribution to the basilar. Basilar is patent. Posterior cerebral arteries patent bilaterally without stenosis. Venous sinuses: Normal enhancement Anatomic variants: None Review of the MIP images confirms the above findings IMPRESSION: 1. Occlusion right M1 segment distally with poor flow in the right M2 and M3 branches. 2. Postop right carotid endarterectomy 2008. Right carotid bifurcation widely patent 3. Severe atherosclerotic disease left carotid bifurcation with occlusion of the left internal carotid artery. 4. Left anterior and left middle cerebral arteries appear patent without significant stenosis through collateral flow through the anterior communicating artery. 5. Posterior circulation intact 6. These results were called by telephone at the time of interpretation on 11/20/2018 at 3:54 Pm to Dr. Roland Rack , who verbally acknowledged these results. Electronically Signed   By: Franchot Gallo M.D.   On: 11/20/2018 16:06   Mr Brain Wo Contrast  Result Date: 11/21/2018 CLINICAL DATA:  Follow-up right M1 occlusion with intervention. EXAM: MRI HEAD WITHOUT CONTRAST TECHNIQUE: Multiplanar, multiecho pulse sequences of the brain and surrounding structures were obtained without intravenous contrast. COMPARISON:  CT studies 11/20/2018 FINDINGS: Brain: No brainstem abnormality. Old right cerebellar infarction. 8 cm region of acute infarction in the right MCA territory affecting the insula and frontoparietal cortical and subcortical brain. Mild swelling but no visible hemorrhage and no mass effect or shift. There is old infarction  in the left occipital lobe and at the left frontoparietal junction vertex. No evidence of mass, hydrocephalus or extra-axial  collection. Vascular: Major vessels at the base of the brain show flow. Skull and upper cervical spine: Negative Sinuses/Orbits: Clear/normal Other: None IMPRESSION: 8 cm confluent region of infarction in the right middle cerebral artery territory affecting the insula and frontoparietal brain. Mild swelling but no evidence of hemorrhage at this time or of significant mass effect or any shift. Old infarctions within the right cerebellum, left occipital lobe and at the left frontoparietal vertex. Electronically Signed   By: Nelson Chimes M.D.   On: 11/21/2018 15:17   Portable Chest Xray  Result Date: 11/21/2018 CLINICAL DATA:  Status post endotracheal intubation EXAM: PORTABLE CHEST 1 VIEW COMPARISON:  11/20/2018 FINDINGS: Cardiac shadow is enlarged but stable. Aortic calcifications are again noted. Endotracheal tube has been repositioned in satisfactory location. The lungs are well aerated without focal infiltrate or sizable effusion. No acute bony abnormality is noted. IMPRESSION: Endotracheal tube in satisfactory position. No acute abnormality noted. Electronically Signed   By: Inez Catalina M.D.   On: 11/21/2018 07:35   Dg Chest Port 1 View  Result Date: 11/20/2018 CLINICAL DATA:  Intubation. EXAM: PORTABLE CHEST 1 VIEW COMPARISON:  11/16/2018 FINDINGS: The endotracheal tube terminates approximately 1.5 cm above the carina. Heart size remains enlarged. There is generalized volume overload without overt pulmonary edema. There is pleuroparenchymal scarring at the lung apices. There are trace bilateral pleural effusions. IMPRESSION: 1. The endotracheal tube is borderline low. Repositioning should be considered. 2. Cardiomegaly with mild vascular congestion. Electronically Signed   By: Constance Holster M.D.   On: 11/20/2018 20:49   Ct Head Code Stroke Wo Contrast  Result Date: 11/20/2018 CLINICAL DATA:  Code stroke. Altered level of consciousness. Stroke. ESRD. Carotid endarterectomy on the right 2008 EXAM:  CT HEAD WITHOUT CONTRAST TECHNIQUE: Contiguous axial images were obtained from the base of the skull through the vertex without intravenous contrast. COMPARISON:  None. FINDINGS: Brain: Generalized atrophy without hydrocephalus. Chronic infarct left occipital lobe. Small chronic infarct right cerebellum. Negative for acute infarct, hemorrhage, mass Vascular: Subtle hyperdensity right M1 bifurcation in the sylvian fissure. Skull: Negative Sinuses/Orbits: Negative Other: None ASPECTS ([place] Stroke Program Early CT Score) - Ganglionic level infarction (caudate, lentiform nuclei, internal capsule, insula, M1-M3 cortex): 7 - Supraganglionic infarction (M4-M6 cortex): 3 Total score (0-10 with 10 being normal): 10 IMPRESSION: 1. Subtle hyperdense right MCA. There is occlusion of the right distal M1 on CTA today. 2. ASPECTS is 10 3. No acute infarct or hemorrhage 4. These results were called by telephone at the time of interpretation on 11/20/2018 at 3:54 pm to Dr. Leonel Ramsay , who verbally acknowledged these results. Electronically Signed   By: Franchot Gallo M.D.   On: 11/20/2018 15:54    Labs:  CBC: Recent Labs    11/20/18 1518 11/20/18 1523 11/20/18 2053 11/21/18 0538 11/22/18 0500  WBC 6.5  --   --  6.2 7.9  HGB 12.0 13.9 10.5* 9.2* 9.4*  HCT 38.0 41.0 31.0* 29.0* 29.2*  PLT 234  --   --  224 214    COAGS: Recent Labs    11/20/18 1518  INR 1.0  APTT 28    BMP: Recent Labs    11/20/18 1518 11/20/18 1523 11/20/18 2053 11/21/18 0538 11/22/18 0500  NA 140 141 142 140 142  K 3.9 3.9 4.4 3.9 3.7  CL 106 108  --  112* 108  CO2  20*  --   --  19* 21*  GLUCOSE 167* 163*  --  108* 136*  BUN 20 24*  --  15 16  CALCIUM 9.2  --   --  8.3* 8.9  CREATININE 1.06* 1.00  --  0.98 1.01*  GFRNONAA 51*  --   --  56* 54*  GFRAA 59*  --   --  >60 >60    LIVER FUNCTION TESTS: Recent Labs    11/20/18 1518  BILITOT 0.9  AST 19  ALT 32  ALKPHOS 66  PROT 6.7  ALBUMIN 3.8    Assessment  and Plan:  Right MCA distal M1 segment occlusion s/p cerebral arteriogram with unsuccessful mechanical thrombectomy (due to underlying hard ICAD) and unsuccessful intracranial rescue stenting (due to vascular tortuosity) achieving a TICI 2A revascularization 11/20/2018 by Dr. Estanislado Pandy. Patient's condition improving- remains intubated/sedated (for possible extubation today), follows commands of right side, LLE withdraws from pain, no spontaneous movements of LUE. Left groin incision c/d/i. Further plans per neurology/CCM- appreciate and agree with management. Please call NIR with questions/concerns.   Electronically Signed: Earley Abide, PA-C 11/22/2018, 10:30 AM   I spent a total of 25 Minutes at the the patient's bedside AND on the patient's hospital floor or unit, greater than 50% of which was counseling/coordinating care for right MCA distal M1 segment occlusion.

## 2018-11-22 NOTE — Procedures (Signed)
Extubation Procedure Note  Patient Details:   Name: [name] DOB: [birth year] MRN: [id number]   Airway Documentation:    Vent end date: 11/22/18 Vent end time: 1232   Evaluation  O2 sats: stable throughout Complications: No apparent complications Patient did tolerate procedure well. Bilateral Breath Sounds: Clear   Yes   Patient was extubated to a 4L [place] with her son & RN at the bedside. Pt. Is not having any respiratory distress or stridor at this time. Positive cuff leak.   Claretta Fraise 11/22/2018, 12:32 PM

## 2018-11-22 NOTE — Progress Notes (Signed)
Patient HR a-fib sustaining 120s-140s, neuro MD notified and 5mg  Metoprolol ordered one time dose. Will administer.  Candy Sledge, RN

## 2018-11-22 NOTE — Progress Notes (Addendum)
SLP Cancellation Note  Patient Details Name: [name] MRN: [id number] DOB: [birth year]   Cancelled treatment:       Reason Eval/Treat Not Completed: Medical issues which prohibited therapy - remains on vent. ST will sign off. Please reorder if pt becomes appropriate.     Orbie Pyo [place].Ed Risk analyst [phone] Office [phone]

## 2018-11-22 NOTE — Progress Notes (Signed)
STROKE TEAM PROGRESS NOTE   INTERVAL HISTORY Patient remains intubated for respiratory failure and lightly sedated.  MRI scan done yesterday confirms large right MCA cortical infarct with sparing of the basal ganglia and no hemorrhagic transformation.  She is breathing about the ventilator but requires pressure support of 15 yet.  Blood pressure adequately controlled.  Vitals:   11/22/18 0600 11/22/18 0700 11/22/18 0736 11/22/18 0800  BP: (!) 165/66 (!) 163/62 (!) 155/61 (!) 161/57  Pulse: 82 78 81 72  Resp: 16 16 16 16   Temp:    99.9 F (37.7 C)  TempSrc:    Axillary  SpO2: 100% 100% 100% 100%  Weight:      Height:        CBC:  Recent Labs  Lab 11/20/18 1518  11/21/18 0538 11/22/18 0500  WBC 6.5  --  6.2 7.9  NEUTROABS 3.0  --  4.6  --   HGB 12.0   < > 9.2* 9.4*  HCT 38.0   < > 29.0* 29.2*  MCV 89.4  --  88.7 86.6  PLT 234  --  224 214   < > = values in this interval not displayed.    Basic Metabolic Panel:  Recent Labs  Lab 11/20/18 1955  11/21/18 0538 11/22/18 0500  NA  --    < > 140 142  K  --    < > 3.9 3.7  CL  --   --  112* 108  CO2  --   --  19* 21*  GLUCOSE  --   --  108* 136*  BUN  --   --  15 16  CREATININE  --   --  0.98 1.01*  CALCIUM  --   --  8.3* 8.9  MG 1.5*  --   --   --    < > = values in this interval not displayed.   Lipid Panel:     Component Value Date/Time   CHOL 124 11/21/2018 0538   TRIG 122 11/22/2018 0500   HDL 39 (L) 11/21/2018 0538   CHOLHDL 3.2 11/21/2018 0538   VLDL 13 11/21/2018 0538   LDLCALC 72 11/21/2018 0538   HgbA1c:  Lab Results  Component Value Date   HGBA1C 6.6 (H) 11/21/2018    IMAGING Ct Head Code Stroke Wo Contrast 11/20/2018 1. Subtle hyperdense right MCA. There is occlusion of the right distal M1 on CTA today. 2. ASPECTS is 10 3. No acute infarct or hemorrhage   Ct Angio Head W Or Wo Contrast Ct Angio Neck W Or Wo Contrast 11/20/2018 1. Occlusion right M1 segment distally with poor flow in the right  M2 and M3 branches. 2. Postop right carotid endarterectomy 2008. Right carotid bifurcation widely patent 3. Severe atherosclerotic disease left carotid bifurcation with occlusion of the left internal carotid artery. 4. Left anterior and left middle cerebral arteries appear patent without significant stenosis through collateral flow through the anterior communicating artery. 5. Posterior circulation intact   Cerebral Angiogram 11/20/2018 1.occluded Rt MCA distal M1 seg. Unsuccessful revascularization Despite x 2 passes with 56mm xc 40mm embotrap retriever device and penumbra aspiration  due to underlying hard ICAD. Unsuccessful intracranial rescue stenting dut vascular tortuosity.  Mr Brain Wo Contrast 11/21/2018 8 cm confluent region of infarction in the right middle cerebral artery territory affecting the insula and frontoparietal brain. Mild swelling but no evidence of hemorrhage at this time or of significant mass effect or any shift. Old infarctions within the right  cerebellum, left occipital lobe and at the left frontoparietal vertex.   Dg Chest Port 1 View 11/20/2018 1. The endotracheal tube is borderline low. Repositioning should be considered. 2. Cardiomegaly with mild vascular congestion.  11/21/2018 Endotracheal tube in satisfactory position. No acute abnormality noted.   2D Echocardiogram  1. Akinesis of the left ventricular, entire inferior wall and inferolateral wall.  2. The left ventricle has moderately reduced systolic function, with an ejection fraction of 35-40%. The cavity size was normal. There is mild concentric left ventricular hypertrophy. Left ventricular diastolic Doppler parameters are consistent with pseudonormalization. Elevated mean left atrial pressure.  3. The right ventricle has normal systolic function. The cavity was normal. There is no increase in right ventricular wall thickness. Right ventricular systolic pressure could not be assessed.  4. Left atrial size was  mildly dilated.  5. There is mild mitral annular calcification present.  6. The aorta is normal unless otherwise noted.   PHYSICAL EXAM * Elderly African-American lady who is sedated intubated.  She has right groin sheath. . Afebrile. Head is nontraumatic. Neck is supple without bruit.    Cardiac exam no murmur or gallop. Lungs are clear to auscultation. Distal pulses are well felt. Neurological Exam : Patient is sedated and intubated.  She opens eyes to sternal rub.  She has right gaze deviation.  She is not able to follow gaze to the left.  She blinks to threat on the right but not on the left.  Pupils equal reactive.  Fundi not visualized.  She follows simple commands on the right side but not on the left.  She does move right upper and lower extremity against gravity.  She withdraws left lower extremity partially to pain.  Does not withdraw left upper extremity.  Tone is diminished on the left and normal on the right.  Right plantars downgoing left is equivocal.  ASSESSMENT/PLAN [name] is a 78 y.o. female with history of myocardial infarction, hypertension, hyperlipidemia, diabetes, carotid artery occlusion on the left, CAD and arthritis presenting with L sided neglect, R eye deviation and L side flaccid. Received tPA 11/20/2018 at 1545. CTA showed R M1 occlusion. Taken to IR.  SBP > 220.   Stroke:   R MCA insular/frontoparietal infarct s/p tPA and unsuccessful IR of R M1 occlusion - infarct due to large vessel disease  Code Stroke CT head subtle hyperdense R MCA. No acute abnormality. ASPECTS 10.     CTA head & neck R M! Occlusion w/ poor flor R M2 and R M3. R CEA 2008 w/ patent bifurcation. Severe L ICA bifurcation atherosclerosis w/ occlusion L ICA. L ACA and L MCA patent from ACom. Poster circ ok.  Cerebral angio occluded R M1. Unsuccessful revascularization after 2 passes w/ embotrap and penumbra aspiration d/t underlying hard atherosclerosis and vascular tortuosity.  Post  IR CT no ICH or mass effect  MRI R MCA infarct insular and frontoparietal. Old R cerebellar, L occipital, L frontoparietal vertex infarcts.  2D Echo EF 35-40%. LA mildly dilated. No source of embolus   LDL 72  HgbA1c 6.6  SCDs for VTE prophylaxis  aspirin 81 mg daily and pletal prior to admission, now on No antithrombotic as within 24h of tPA administration.  Resume aspirin if no bleed on follow-up scan  Therapy recommendations:  pending   Disposition:  pending  (lives alone, son checks on daily)  Acute Respiratory Failure w/ hypoxia  Intubated for IR, not extubated at the end of the  procedure d/t tPA administration and sheath left in  Sheath now out  [facility] for extubation today  CCM on aboard  Possible Atrial Fibrillation  Rhythm post intubation looked like AF to MD  AF not captured on EKG or tele  Continue telemetry monitoring   Carotid Disease  Hx L ICA occlusion  Hypertensive Urgency  SBP > 220 in ED, as high as 222/128  Home meds:  norvasc 10, clonidine 0.1 bid, labetalol 200 bid, valsartan 320-12.5 daily  Treated w/ cleviprex, Now off. Sedated.  BP stable  Long-term BP goal normotensive  Hyperlipidemia  Home meds:  zocor 20  LDL 72, goal < 70  Resume statin once able to swallow  Continue statin at discharge  Diabetes type II Controlled  Home meds:  Glipizide 5, glipizide-metformin 5-500 bid, Januvia 100  HgbA1c 6.6, goal < 7.0  CBGs  SSI  DB RN consulted   Dysphagia  Secondary to stroke  NPO   Other Stroke Risk Factors  Advanced age  Former Cigarette smoker  Coronary artery disease s/p MI    Other Active Problems  R DP and PT not dopplerable prior to or post IR  Acute blood loss anemia 13.9->9.2->9.4. monitor.   Hospital day # 2 Continue ventilatory support for respiratory failure and attempt to wean as per pulmonary critical care team.  Continue aspirin for stroke prevention for now.  Mobilize in bed.  Therapy  consults.  Discussed with Dr. Kipp Brood critical care medicine and patient's bedside RN.This patient is critically ill and at significant risk of neurological worsening, death and care requires constant monitoring of vital signs, hemodynamics,respiratory and cardiac monitoring, extensive review of multiple databases, frequent neurological assessment, discussion with family, other specialists and medical decision making of high complexity.I have made any additions or clarifications directly to the above note.This critical care time does not reflect procedure time, or teaching time or supervisory time of PA/NP/Med Resident etc but could involve care discussion time.  I spent 30 minutes of neurocritical care time  in the care of  this patient.   Antony Contras, MD Medical Director [place] Pager: [phone] 11/22/2018 4:56 PM   To contact Stroke Continuity provider, please refer to [email or web address] After hours, contact General Neurology

## 2018-11-22 NOTE — Progress Notes (Signed)
NAME:  [name], MRN:  [id number], DOB:  02-01-1941, LOS: 2 ADMISSION DATE:  11/20/2018, CONSULTATION DATE:  11/20/18 REFERRING MD:  Dr. Leonel Wu, CHIEF COMPLAINT:  Facial droop   Brief History   78 y.o. F who lives alone who was found in her bed by her son with L-sided neglect and eye deviation to the R.  Last known well was 12:00 on 8/31.  CTA done in the ED and showed R M1 occlusion and pt and family decided to proceed with thrombectomy.  She was intubated pre-procedure and PCCM was consulted. Revascularization was unsuccessful and the patient remains intubated.    History of present illness   Ms. [name] is a 78 y.o. F with PMH of CAD and MI, HTN, HL, cardiomyopathy, Type II DM, and bilateral carotid artery stenosis s/p right carotid endarterectomy in 2008 who was found by her son 8/31 in bed with flaccid L side, L facial droop and slurred speech.  She lives independently and he had last spoken to her on the phone around noon today and she seemed normal.  Her home medications include Asa 81mg  and Simvastatin 20mg .   In the ED she was treated with IV TPA, completed at 3:45 pm. CTA head significant for R M1 occlusion and son opted to move forward with thrombectomy.  She was hypertensive and tachycardic with max BP of 222/128, treated with Labetalol 20mg  IV. Glucose was elevated at 167,  and Lab results were otherwise non-actionable .  EKG showed sinus tachycardia with prolonged Qtc 510.  She was intubated and taken to IR.  In IR she had right common carotid arteriogram with unsuccessful revascularization despite 2 passes with embotrap retriever device, unsuccessful intracranial rescue stenting due to vascular tortuosity.  Past Medical History  CAD s/p MI, cardiomyopathy, HTN, HL, carotid stenosis, DM, mitral valve insufficiency, ? A.fib  Significant Hospital Events   8/31 Admission, unsuccessful IR revascularization attempt  Consults:  PCCM  Procedures:  ETT 8/31  L fem  arterial sheath 8/31, sheath removed 9/1  Significant Diagnostic Tests:  8/31 CT/ CTA head>Hyperdense R MCA, occlusion of the R distal M1, no hemorrhage, severe atherosclerotic disease with occlusion of the L internal carotid  9/1 MRI- 8 cm confluent R MCA infarction affecting insula and frontoparietal brain. Mild swelling, no hemorrhage or mass effect. Old infarctions in R cerebellum, L occipital lobe, and L frontoparietal vertex.  9/1 Echo- Akinesis of entire LV inferior and inferolateral walls, EF of 35-40%, mild concentric LV hypertrophy. Mild LA dilation with increased mean pressure. Mild mitral annular calcification.   Micro Data:  8/31 Sars-CoV-2 >> neg  Antimicrobials:  None.  Interim history/subjective:  Intubated and sedated for exam. Nursing reports patient has been more responsive to noise, touch and pain. Left groin sheath removed yesterday without complication. Experienced respiratory distress with O2 sat dropping to 87% following MRI, resolved with repositioning.   Objective   Blood pressure (!) 157/62, pulse 100, temperature 99.9 F (37.7 C), temperature source Axillary, resp. rate (!) 24, height 5\' 1"  (1.549 m), weight 46.4 kg, SpO2 100 %.    Vent Mode: PSV;CPAP FiO2 (%):  [40 %-70 %] 40 % Set Rate:  [16 bmp] 16 bmp Vt Set:  [380 mL-400 mL] 380 mL PEEP:  [5 cmH20-8 cmH20] 5 cmH20 Pressure Support:  [5 cmH20] 5 cmH20 Plateau Pressure:  [15 cmH20-20 cmH20] 18 cmH20   Intake/Output Summary (Last 24 hours) at 11/22/2018 0941 Last data filed at 11/22/2018 0800 Gross per 24 hour  Intake 393.86 ml  Output 2775 ml  Net -2381.14 ml   Filed Weights   11/20/18 1500  Weight: 46.4 kg    Examination: General: Elderly woman, sedated and intubated, lying in bed with eyes closed. HENT: Normocephalic and atraumatic. Anicteric sclera. Neck- Supple. JV appreciated at clavicle at 45 degrees, significantly improved from yesterday's exam Lungs:  Intubated, trial of spontaneous  breathing initiated. Clear to auscultation with symmetric chest wall expansion. Cardiovascular: Regular rate and irregular rhythm, PAC's noted on telemetry. Holosystolic murmur appreciated at left sternal border, unchanged from prior exam. No carotid bruits. 1+ radial pulses. Unable to appreciate dorsalis pedis, posterior tibialis pulses bilaterally on exam. Abdomen: Non-tender, non-distended. Extremities: No lower extremity edema appreciated, lower extremities distal to ankle are cold bilaterally. MSK: Diffuse muscle wasting with decreased bulk noticed at shoulders and thighs. Increased tone in right extremities, especially right upper extremity. Flaccid in left upper extremity.  Neuro:  Mental status: Sedated with eyes closed on 10 mg propafol. Responds to noise and pain bilaterally, able to follow commands. Spontaneously moves right leg at rest. Reflexes: Brisk patellar and bicep reflexes bilaterally Cranial Nerves: Pupils are large, equal, round, and reactive to light. Responds to touch bilaterally in the face  Assessment & Plan:  Respiratory distress- secondary to pulmonary edema and intubation for revascularization procedure - Full vent support - SBT today for 2 hours with hopes of extubation today - Bronchial hygiene.  Thrombotic R MCA CVA secondary to M1 occlusion, Chronic bilateral ICA stenosis - s/p IR revascularization attempt but unfortunately unsuccessful.  - Stroke workup per neuro. - Restart home ASA today - Cleviprex as needed for goal SBP 120 - 160 per neuro. - F/u on repeat CT, MRI, echo. - Will need PT/OT eval when stable.  Hx HTN, HL, Cardiomyopathy (Last cardiology note under Care Everywhere from 07/06/2017 with EF 30-35%, telephone encounter with [facility] shows congestive heart failure exacerbation treated at [place] on 8/27), CAD s/p MI, ? A.fib per neuro (some discussions with son regarding possible A.fib; however, no documentation.  Not on any anticoagulation).  Resolution of JVD on exam today, will hold off on further diuresis. - Cleviprex PRN for goal SBP 120 - 160 per neuro. - Hold home antihypertensives (Norvasc, Clonidine, Labetalol, Diovan). -Transition Simvastatin to high intensity statin, Rosuvastatin 20mg . - Hold diuresis today  Hx DM. - SSI. - Hold home glipizide, metaglip, januvia. - Check A1c.  Hypomagnesemia- Noted Mg 1.5 on 9/1. -Check Mg tomorrow AM  Best practice:  Diet: NPO Pain/Anxiety/Delirium protocol (if indicated): Propofol ggt / Fentanyl PRN. RASS goal -1. VAP protocol (if indicated): In place. DVT prophylaxis: SCDs in place GI prophylaxis: Protonix Glucose control: Continue insulin sliding scale Mobility: PT/OT Code Status: FULL Family Communication: Son - Writer. [phone] Disposition: Continue ICU care   Labs   CBC: Recent Labs  Lab 11/20/18 1518 11/20/18 1523 11/20/18 2053 11/21/18 0538 11/22/18 0500  WBC 6.5  --   --  6.2 7.9  NEUTROABS 3.0  --   --  4.6  --   HGB 12.0 13.9 10.5* 9.2* 9.4*  HCT 38.0 41.0 31.0* 29.0* 29.2*  MCV 89.4  --   --  88.7 86.6  PLT 234  --   --  224 [id number]    Basic Metabolic Panel: Recent Labs  Lab 11/20/18 1518 11/20/18 1523 11/20/18 1955 11/20/18 2053 11/21/18 0538 11/22/18 0500  NA 140 141  --  142 140 142  K 3.9 3.9  --  4.4 3.9  3.7  CL 106 108  --   --  112* 108  CO2 20*  --   --   --  19* 21*  GLUCOSE 167* 163*  --   --  108* 136*  BUN 20 24*  --   --  15 16  CREATININE 1.06* 1.00  --   --  0.98 1.01*  CALCIUM 9.2  --   --   --  8.3* 8.9  MG  --   --  1.5*  --   --   --    GFR: Estimated Creatinine Clearance: 34.7 mL/min (A) (by C-G formula based on SCr of 1.01 mg/dL (H)). Recent Labs  Lab 11/20/18 1518 11/21/18 0538 11/22/18 0500  WBC 6.5 6.2 7.9    Liver Function Tests: Recent Labs  Lab 11/20/18 1518  AST 19  ALT 32  ALKPHOS 66  BILITOT 0.9  PROT 6.7  ALBUMIN 3.8   No results for input(s): LIPASE, AMYLASE in the last 168  hours. No results for input(s): AMMONIA in the last 168 hours.  ABG    Component Value Date/Time   PHART 7.372 11/20/2018 2053   PCO2ART 33.1 11/20/2018 2053   PO2ART 57.0 (L) 11/20/2018 2053   HCO3 19.4 (L) 11/20/2018 2053   TCO2 20 (L) 11/20/2018 2053   ACIDBASEDEF 5.0 (H) 11/20/2018 2053   O2SAT 90.0 11/20/2018 2053     Coagulation Profile: Recent Labs  Lab 11/20/18 1518  INR 1.0    Cardiac Enzymes: No results for input(s): CKTOTAL, CKMB, CKMBINDEX, TROPONINI in the last 168 hours.  HbA1C: Hgb A1c MFr Bld  Date/Time Value Ref Range Status  11/21/2018 05:38 AM 6.6 (H) 4.8 - 5.6 % Final    Comment:    (NOTE) Pre diabetes:          5.7%-6.4% Diabetes:              >6.4% Glycemic control for   <7.0% adults with diabetes   01/06/2007 03:43 PM   Final   5.9 (NOTE)   The ADA recommends the following therapeutic goals for glycemic   control related to Hgb A1C measurement:   Goal of Therapy:   < 7.0% Hgb A1C   Action Suggested:  > 8.0% Hgb A1C   Ref:  Diabetes Care, 22, Suppl. 1, 1999    CBG: Recent Labs  Lab 11/21/18 1528 11/21/18 1937 11/21/18 2306 11/22/18 0306 11/22/18 0730  GLUCAP 156* 136* 175* 115* 156*    Review of Systems:   Unable to review due to patient's current status.  Past Medical History  She,  has a past medical history of Arthritis, CAD (coronary artery disease), Carotid artery occlusion, Diabetes mellitus, Hyperlipidemia, Hypertension, and Myocardial infarction ([place]).   Surgical History    Past Surgical History:  Procedure Laterality Date   CAROTID ENDARTERECTOMY  01/06/2007   right - with Dacron patch angioplasty   HERNIA REPAIR  2012   RADIOLOGY WITH ANESTHESIA N/A 11/20/2018   Procedure: CODE STROKE;  Surgeon: Luanne Bras, MD;  Location: [place];  Service: Radiology;  Laterality: N/A;     Social History   reports that she quit smoking about 31 years ago. Her smoking use included cigarettes. She smoked 1.00 pack per day. She  has never used smokeless tobacco. She reports that she does not drink alcohol.   Family History   Her family history includes Diabetes in her brother and mother; Heart disease in her brother and mother.   Allergies No Known  Allergies   Home Medications  Prior to Admission medications   Medication Sig Start Date End Date Taking? Authorizing Provider  amLODipine (NORVASC) 10 MG tablet Take 10 mg by mouth daily.      [provider]  aspirin EC 81 MG tablet Take 81 mg by mouth daily.      [provider]  cholecalciferol (VITAMIN D) 1000 UNITS tablet Take 2,000 Units by mouth daily.    [provider]  cilostazol (PLETAL) 100 MG tablet Take 100 mg by mouth daily.      [provider]  cloNIDine (CATAPRES) 0.1 MG tablet Take 0.1 mg by mouth 2 (two) times daily.      [provider]  glipiZIDE (GLUCOTROL) 5 MG tablet Take 5 mg by mouth daily. Take one half tab at noon    [provider]  glipiZIDE-metformin (METAGLIP) 5-500 MG per tablet Take 1 tablet by mouth 2 (two) times daily before a meal.      [provider]  labetalol (NORMODYNE) 200 MG tablet Take 200 mg by mouth 2 (two) times daily.      [provider]  loratadine (CLARITIN) 10 MG tablet Take 10 mg by mouth Daily. 05/28/11   [provider]  simvastatin (ZOCOR) 20 MG tablet Take 20 mg by mouth at bedtime.      [provider]  sitaGLIPtin (JANUVIA) 100 MG tablet Take 100 mg by mouth daily.      [provider]  valsartan-hydrochlorothiazide (DIOVAN-HCT) 320-12.5 MG per tablet Take 1 tablet by mouth daily.      [provider]    Leonides Schanz, MS4  Critical care attending attestation note:  Patient seen and examined and relevant ancillary tests reviewed.  I agree with the assessment and plan of care as outlined by J Plyler, MS IV. This patient was not seen as a shared visit.   Synopsis of assessment and plan:  78 year old  woman who presented within TPA window with L sided weakness. Symptoms persisted in spite of TPA. Unable to revascularize M1 occlusion. Patient returned from IR intubated.  Remains critically ill due to respiratory failure requiring mechanical ventilation.   On examination: Frail appearing woman Intubated and mechanically ventilated.  ETT/OGT in place with no pressure ulceration.  Chest clear bilaterally with no ventilator dyssynchrony. Increased O2 requirements since return from MRI. JVP elevated and apex sustained. HS normal. No edema, extremities cool Abdomen soft Moves all limbs except LUE.  CXR consistent with pulmonary edema.  (personally reviewed) Echo showed LVH and moderate to severe systolic dysfunction (personally reviewed) MRI head showed evidence of completed infarction in right MCA territory with mimimal edema. No shift. (personally reviewed)  ASSESSMENT:  Completed CVA with exam now consistent with location of stroke.  Known HFREF and signs of pulmonary overload. Now needs diuresis for increasing O2 requirements and to facilitate extubation. Move to WUA/SBT in am once patient able to sit up. In meantime titrate FiO2 and increase PEEP to 8 to improve oxygenation.  Start secondary stroke prevention.  CRITICAL CARE Performed by: Roderic Palau Plyler   Total critical care time:   11/22/2018, 9:41 AM Critical care attending attestation note:  Patient seen and examined and relevant ancillary tests reviewed.  I agree with the assessment and plan of care as outlined by J Plyler, MS IV. This patient was not seen as a shared visit. The following reflects my independent critical care time.   Synopsis of assessment and plan:  78  year old woman who was admitted following right MCA stroke with unsuccessful revascularization.  MRI shows medium to large right MCA stroke.  Remained intubated to allow for safe MRI, sheath removal and diuresis (known HFREF).  Remains critically ill  due to respiratory failure requiring mechanical ventilation.   On examination: Frail appearing. Orally intubated. Chest clear and has tolerated SBT for 2h without increased work of breathing. JVP is not elevated.  Heart sounds are unremarkable.  No peripheral edema.  Peripheral pulses are diminished. Abdomen soft and nontender. Patient is awake and follows commands briskly in all but the left upper extremity.  She is in net -1.8 L fluid balance.  Assessment:  Ready for trial of extubation Continue diuresis as necessary Assess swallow Stroke rehabilitation  Have discussed goals of care with her son.  She has made it clear in the past that she would not wish to be supported long-term by extraordinary measures. At this point she would require further reintubation we will transition to comfort care.  This plan was discussed and agreed upon by her son.  CRITICAL CARE Performed by: Kipp Brood   Total critical care time: 40 minutes  Critical care time was exclusive of separately billable procedures and treating other patients.  Critical care was necessary to treat or prevent imminent or life-threatening deterioration.  Critical care was time spent personally by me on the following activities: development of treatment plan with patient and/or surrogate as well as nursing, discussions with consultants, evaluation of patient's response to treatment, examination of patient, obtaining history from patient or surrogate, ordering and performing treatments and interventions, ordering and review of laboratory studies, ordering and review of radiographic studies, pulse oximetry, re-evaluation of patient's condition and participation in multidisciplinary rounds.  Kipp Brood, MD [facility] ICU Physician [place]  Pager: [phone] Mobile: [phone] After hours: [phone].  11/22/2018, 2:33 PM

## 2018-11-23 ENCOUNTER — Encounter (HOSPITAL_COMMUNITY): Payer: Self-pay

## 2018-11-23 ENCOUNTER — Inpatient Hospital Stay (HOSPITAL_COMMUNITY): Payer: Medicare Other

## 2018-11-23 DIAGNOSIS — I4891 Unspecified atrial fibrillation: Secondary | ICD-10-CM

## 2018-11-23 DIAGNOSIS — E43 Unspecified severe protein-calorie malnutrition: Secondary | ICD-10-CM | POA: Insufficient documentation

## 2018-11-23 DIAGNOSIS — I519 Heart disease, unspecified: Secondary | ICD-10-CM

## 2018-11-23 DIAGNOSIS — I10 Essential (primary) hypertension: Secondary | ICD-10-CM

## 2018-11-23 DIAGNOSIS — I63311 Cerebral infarction due to thrombosis of right middle cerebral artery: Secondary | ICD-10-CM

## 2018-11-23 LAB — BASIC METABOLIC PANEL
Anion gap: 15 (ref 5–15)
BUN: 22 mg/dL (ref 8–23)
CO2: 20 mmol/L — ABNORMAL LOW (ref 22–32)
Calcium: 8.6 mg/dL — ABNORMAL LOW (ref 8.9–10.3)
Chloride: 111 mmol/L (ref 98–111)
Creatinine, Ser: 1.03 mg/dL — ABNORMAL HIGH (ref 0.44–1.00)
GFR calc Af Amer: >60 mL/min (ref >60)
GFR calc non Af Amer: 53 mL/min — ABNORMAL LOW (ref >60)
Glucose, Bld: 132 mg/dL — ABNORMAL HIGH (ref 70–99)
Potassium: 3.4 mmol/L — ABNORMAL LOW (ref 3.5–5.1)
Sodium: 146 mmol/L — ABNORMAL HIGH (ref 135–145)

## 2018-11-23 LAB — GLUCOSE, CAPILLARY
Glucose-Capillary: 116 mg/dL — ABNORMAL HIGH (ref 70–99)
Glucose-Capillary: 156 mg/dL — ABNORMAL HIGH (ref 70–99)
Glucose-Capillary: 102 mg/dL — ABNORMAL HIGH (ref 70–99)
Glucose-Capillary: 168 mg/dL — ABNORMAL HIGH (ref 70–99)
Glucose-Capillary: 125 mg/dL — ABNORMAL HIGH (ref 70–99)
Glucose-Capillary: 199 mg/dL — ABNORMAL HIGH (ref 70–99)

## 2018-11-23 LAB — MAGNESIUM
Magnesium: 1.9 mg/dL (ref 1.7–2.4)
Magnesium: 2 mg/dL (ref 1.7–2.4)

## 2018-11-23 LAB — TRIGLYCERIDES: Triglycerides: 106 mg/dL (ref <150)

## 2018-11-23 LAB — TSH: TSH: 0.974 u[IU]/mL (ref 0.350–4.500)

## 2018-11-23 LAB — PHOSPHORUS: Phosphorus: 3.6 mg/dL (ref 2.5–4.6)

## 2018-11-23 MED ORDER — METOPROLOL TARTRATE 25 MG/10 ML ORAL SUSPENSION
25.0000 mg | Freq: Two times a day (BID) | ORAL | Status: DC
  Administered 2018-11-24 (×2): 25 mg
  Filled 2018-11-23 (×3): qty 10

## 2018-11-23 MED ORDER — OSMOLITE 1.2 CAL PO LIQD
1000.0000 mL | ORAL | Status: DC
  Administered 2018-11-23 – 2018-11-28 (×4): 1000 mL
  Filled 2018-11-23 (×11): qty 1000

## 2018-11-23 MED ORDER — ENOXAPARIN SODIUM 40 MG/0.4ML ~~LOC~~ SOLN
40.0000 mg | SUBCUTANEOUS | Status: DC
  Administered 2018-11-23: 40 mg via SUBCUTANEOUS
  Filled 2018-11-23: qty 0.4

## 2018-11-23 MED ORDER — DILTIAZEM HCL-DEXTROSE 100-5 MG/100ML-% IV SOLN (PREMIX)
5.0000 mg/h | INTRAVENOUS | Status: DC
  Administered 2018-11-23: 5 mg/h via INTRAVENOUS
  Filled 2018-11-23: qty 100

## 2018-11-23 MED ORDER — HYDROCHLOROTHIAZIDE 12.5 MG PO CAPS
12.5000 mg | ORAL_CAPSULE | Freq: Every day | ORAL | Status: DC
  Administered 2018-11-24 – 2018-11-29 (×6): 12.5 mg via ORAL
  Filled 2018-11-23 (×8): qty 1

## 2018-11-23 MED ORDER — ASPIRIN 300 MG RE SUPP
150.0000 mg | Freq: Every day | RECTAL | Status: DC
  Administered 2018-11-23: 150 mg via RECTAL
  Filled 2018-11-23: qty 1

## 2018-11-23 MED ORDER — IRBESARTAN 300 MG PO TABS
300.0000 mg | ORAL_TABLET | Freq: Every day | ORAL | Status: DC
  Administered 2018-11-24 – 2018-12-02 (×9): 300 mg
  Filled 2018-11-23 (×3): qty 1
  Filled 2018-11-23: qty 2
  Filled 2018-11-23 (×5): qty 1

## 2018-11-23 MED ORDER — DILTIAZEM HCL-DEXTROSE 100-5 MG/100ML-% IV SOLN (PREMIX)
5.0000 mg/h | INTRAVENOUS | Status: DC
  Administered 2018-11-23 (×3): 15 mg/h via INTRAVENOUS
  Administered 2018-11-24 (×2): 10 mg/h via INTRAVENOUS
  Filled 2018-11-23 (×5): qty 100

## 2018-11-23 MED ORDER — METOPROLOL TARTRATE 25 MG PO TABS
25.0000 mg | ORAL_TABLET | Freq: Two times a day (BID) | ORAL | Status: DC
  Filled 2018-11-23: qty 1

## 2018-11-23 MED ORDER — METOPROLOL TARTRATE 5 MG/5ML IV SOLN
5.0000 mg | Freq: Once | INTRAVENOUS | Status: AC
  Administered 2018-11-23: 5 mg via INTRAVENOUS
  Filled 2018-11-23: qty 5

## 2018-11-23 MED ORDER — PRO-STAT SUGAR FREE PO LIQD: 30.0000 mL | Freq: Two times a day (BID) | ORAL | Status: DC

## 2018-11-23 MED ORDER — DILTIAZEM HCL-DEXTROSE 100-5 MG/100ML-% IV SOLN (PREMIX)
5.0000 mg/h | INTRAVENOUS | Status: DC
  Filled 2018-11-23: qty 100

## 2018-11-23 MED ORDER — POTASSIUM CHLORIDE 20 MEQ/15ML (10%) PO SOLN
40.0000 meq | ORAL | Status: AC
  Administered 2018-11-23 – 2018-11-24 (×2): 40 meq
  Filled 2018-11-23 (×2): qty 30

## 2018-11-23 MED ORDER — METOPROLOL TARTRATE 25 MG PO TABS: 25.0000 mg | ORAL_TABLET | Freq: Two times a day (BID) | ORAL | Status: DC

## 2018-11-23 MED ORDER — IRBESARTAN 150 MG PO TABS: 300.0000 mg | ORAL_TABLET | Freq: Every day | ORAL | Status: DC

## 2018-11-23 MED ORDER — HYDROCHLOROTHIAZIDE 12.5 MG PO CAPS: 12.5000 mg | ORAL_CAPSULE | Freq: Every day | ORAL | Status: DC

## 2018-11-23 MED ORDER — SENNOSIDES 8.8 MG/5ML PO SYRP
5.0000 mL | ORAL_SOLUTION | Freq: Every day | ORAL | Status: DC
  Administered 2018-11-23 – 2018-11-30 (×8): 5 mL
  Filled 2018-11-23 (×10): qty 5

## 2018-11-23 MED ORDER — VITAL HIGH PROTEIN PO LIQD: 1000.0000 mL | ORAL | Status: DC

## 2018-11-23 MED ORDER — HEPARIN (PORCINE) 25000 UT/250ML-% IV SOLN
950.0000 [IU]/h | INTRAVENOUS | Status: DC
  Administered 2018-11-23 (×2): 600 [IU]/h via INTRAVENOUS
  Administered 2018-11-24: 650 [IU]/h via INTRAVENOUS
  Administered 2018-11-25: 850 [IU]/h via INTRAVENOUS
  Administered 2018-11-25: 750 [IU]/h via INTRAVENOUS
  Filled 2018-11-23 (×2): qty 250

## 2018-11-23 NOTE — Progress Notes (Signed)
Nutrition Follow-up  DOCUMENTATION CODES:   Severe malnutrition in context of chronic illness  INTERVENTION:   Tube feeding: - Start Osmolite 1.2 @ 20 ml/hr and titrate by 10 ml q 4 hours until goal rate of 50 ml/hr is reached (1200 ml/day)  Tube feeding regimen at goal provides 1440 kcal, 67 grams of protein, and 984 ml of H2O.  Monitor magnesium, potassium, and phosphorus daily for at least 3 days, MD to replete as needed, as pt is at risk for refeeding syndrome given severe malnutrition.  NUTRITION DIAGNOSIS:   Severe Malnutrition related to chronic illness as evidenced by severe muscle depletion, severe fat depletion.  New diagnosis after completion of NFPE  GOAL:   Patient will meet greater than or equal to 90% of their needs  Met via TF at goal rate  MONITOR:   Diet advancement, Labs, Weight trends, TF tolerance, I & O's  REASON FOR ASSESSMENT:   Consult Enteral/tube feeding initiation and management  ASSESSMENT:   78 year old female with history of CAD, chonic left ICA occlusion and known right ICA stenosis, T2DM, HTN, HLD, MI. Patient found laying in bed by her son with rightward gaze, left-sided facial droop, and dysarthric speech stating that she was unable to get up. Patient admitted with thrombotic R MCA CVA secondary to M1 occlusion, chronic bilateral ICA stenosis  8/31 - s/p cerebral arteriogram with unsuccessful mechanical thrombectomy and unsuccessful intracrainal rescue stenting, intubated 9/02 - extubated  Per CCM note, plan is to place NG tube and transition to Cortrak tube tomorrow when Cortrak team is available.  Spoke with RN and SLP at bedside. Per SLP, pt will definitely require a Cortrak. RN is about to place NG tube.  Current TF order: Vital High Protein @ 40 ml/hr, Pro-stat 30 ml BID  RD will adjust TF order to better meet pt's needs.  Medications reviewed and include: SSI q 4 hours, Protonix IVF: NS @ 50 ml/hr  Labs reviewed:  hemoglobin 9.4 CBG's: 92-156 x 24 hours  UOP: 745 ml x 24 hours I/O's: -2.4 L since admit  NUTRITION - FOCUSED PHYSICAL EXAM:    Most Recent Value  Orbital Region  Severe depletion  Upper Arm Region  Severe depletion  Thoracic and Lumbar Region  Moderate depletion  Buccal Region  Moderate depletion  Temple Region  Moderate depletion  Clavicle Bone Region  Severe depletion  Clavicle and Acromion Bone Region  Severe depletion  Scapular Bone Region  Moderate depletion  Dorsal Hand  Moderate depletion  Patellar Region  Severe depletion  Anterior Thigh Region  Severe depletion  Posterior Calf Region  Severe depletion  Edema (RD Assessment)  None  Hair  Reviewed  Eyes  Reviewed  Mouth  Reviewed  Skin  Reviewed  Nails  Reviewed       Diet Order:   Diet Order            Diet NPO time specified  Diet effective now              EDUCATION NEEDS:   No education needs have been identified at this time  Skin:  Skin Assessment: Reviewed RN Assessment  Last BM:  11/21/18  Height:   Ht Readings from Last 1 Encounters:  11/20/18 _0  (1.549 m)    Weight:   Wt Readings from Last 1 Encounters:  11/20/18 46.4 kg    Ideal Body Weight:  47.7 kg  BMI:  Body mass index is 19.33 kg/m.  Estimated  Nutritional Needs:   Kcal:  1400-1600  Protein:  60-75 grams  Fluid:  >/= 1.4 L    Gaynell Face, MS, RD, LDN Inpatient Clinical Dietitian Pager: [phone] Weekend/After Hours: [phone]

## 2018-11-23 NOTE — Consult Note (Addendum)
Cardiology Consultation:   Patient ID: COLETTE DICAMILLO MRN: [id number]; DOB: 10-May-1940  Admit date: 11/20/2018 Date of Consult: 11/23/2018  Primary Care Provider: Eloy End, MD Primary Cardiologist: No primary care provider on file.  Primary Electrophysiologist:  None    Patient Profile:   FELICA CHARGOIS is a 78 y.o. female with a hx of CAD, MI 20 years ago [facility]?), cardiomyopathy with EF of 30-35% by echo, bilateral carotid artery stenosis with left ICA occlsuion and right carotid endarterectomy (2008), CVA (?2015), HTN, HLD, DM, and mitral valve insufficiency  who is being seen today for the evaluation of atrial fibrillation at the request of Dr. Leonie Man.  History of Present Illness:   Ms. Dudzik has a history of CAD, although I do not find records. Care Everywhere mentions establishing cardiac care with Dr. Geraldo Pitter in 2017. At that time, the patient was noted to be a poor historian, but reported a remote MI. She is unsure if she has a stent. She saw vascular surgery in 2018 for carotid artery stenosis. She has complete occlusion of her left ICA and 40-59% stenosis in the R ICA. No intervention at that time. 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 Notes reveal an echocardiogram with an EF of 30%, unknown if this is ischemic in nature. She does carry a history of a R CEA in 2008.   She presented to [facility] with left-sided neglect and eye deviation to the right. CT head with R M1 occlusion and family decided to proceed with thrombectomy/TPA. She was hypertensive and tachycardic. She was intubated prior to the procedure. Unfortunately, revascularization was unsuccessful. According to Neurology's notes, pt appeared to be atrial fibrillation to MD after intubation procedure. Afib was not captured on EKG, but was seen on telemetry overnight. Cardiology was consulted.  Neurology team added cardizem drip this morning. No anticoagulation given large stroke and risk of  hemorrhagic conversion. She was extubated overnight 11/22/18. She is NPO secondary to dysphagia from stroke. Hypertension is being treated with irbesartan, HCTZ, metoprolol 25 mg BID. Nursing will place NG tube until Coretrack can be placed tomorrow.   Son is at bedside and helps with history. He checks on her daily. She was independent prior to this CVA. She lived alone and managed all ADLs. She was found in bed with the above neurological deficits.  Echocardiogram revealed EF of 64-40%, diastolic dysfunction, mild LVH, left atrial size was mildly dilated.    Heart Pathway Score:     Past Medical History:  Diagnosis Date   Arthritis    CAD (coronary artery disease)    Carotid artery occlusion    Diabetes mellitus    Hyperlipidemia    Hypertension    Myocardial infarction [facility])     Past Surgical History:  Procedure Laterality Date   CAROTID ENDARTERECTOMY  01/06/2007   right - with Dacron patch angioplasty   HERNIA REPAIR  2012   IR CT HEAD LTD  11/20/2018   IR CT HEAD LTD  11/20/2018   IR PERCUTANEOUS ART THROMBECTOMY/INFUSION INTRACRANIAL INC DIAG ANGIO  11/20/2018   RADIOLOGY WITH ANESTHESIA N/A 11/20/2018   Procedure: CODE STROKE;  Surgeon: Luanne Bras, MD;  Location: [place];  Service: Radiology;  Laterality: N/A;     Home Medications:  Prior to Admission medications   Medication Sig Start Date End Date Taking? Authorizing Provider  aspirin EC 81 MG tablet Take 81 mg by mouth daily.     Yes  [provider]  cholecalciferol (VITAMIN D) 1000 UNITS tablet Take 2,000 Units by mouth daily.   Yes [provider]  cilostazol (PLETAL) 100 MG tablet Take 100 mg by mouth 2 (two) times daily.    Yes [provider]  cloNIDine (CATAPRES) 0.1 MG tablet Take 0.1 mg by mouth at bedtime.    Yes [provider]  dicyclomine (BENTYL) 10 MG capsule Take 10 mg by mouth 4 (four) times daily as needed for spasms.   Yes [provider]    furosemide (LASIX) 20 MG tablet Take 20 mg by mouth every other day.   Yes [provider]  labetalol (NORMODYNE) 200 MG tablet Take 400 mg by mouth 2 (two) times daily.    Yes [provider]  loratadine (CLARITIN) 10 MG tablet Take 10 mg by mouth Daily. 05/28/11  Yes [provider]  metFORMIN (GLUCOPHAGE) 500 MG tablet Take 500 mg by mouth 2 (two) times daily with a meal.   Yes [provider]  omeprazole (PRILOSEC) 20 MG capsule Take 20 mg by mouth daily.   Yes [provider]  Pancrelipase, Lip-Prot-Amyl, (ZENPEP) 40000-126000 units CPEP Take 1-2 capsules by mouth See admin instructions. Take 2 capsules by mouth three times daily with meals and 1 capsule as needed twice daily with snacks.   Yes [provider]  sacubitril-valsartan (ENTRESTO) 49-51 MG Take 1 tablet by mouth 2 (two) times daily.   Yes [provider]  simvastatin (ZOCOR) 20 MG tablet Take 20 mg by mouth at bedtime.     Yes [provider]    Inpatient Medications: Scheduled Meds:   stroke: mapping our early stages of recovery book   Does not apply Once   aspirin  150 mg Rectal Daily   chlorhexidine  15 mL Mouth Rinse BID   Chlorhexidine Gluconate Cloth  6 each Topical Daily   enoxaparin (LOVENOX) injection  40 mg Subcutaneous Q24H   feeding supplement (PRO-STAT SUGAR FREE 64)  30 mL Per Tube BID   feeding supplement (VITAL HIGH PROTEIN)  1,000 mL Per Tube Q24H   insulin aspart  1-3 Units Subcutaneous Q4H   mouth rinse  15 mL Mouth Rinse q12n4p   metoprolol tartrate  25 mg Oral BID   pantoprazole (PROTONIX) IV  40 mg Intravenous QHS   sodium chloride flush  3 mL Intravenous Once   Continuous Infusions:  sodium chloride     sodium chloride 50 mL/hr at 11/23/18 1109   diltiazem (CARDIZEM) infusion 5 mg/hr (11/23/18 1112)   PRN Meds: acetaminophen **OR** acetaminophen (TYLENOL) oral liquid 160 mg/5 mL **OR** acetaminophen, fentaNYL  (SUBLIMAZE) injection, fentaNYL (SUBLIMAZE) injection, senna-docusate  Allergies:   No Known Allergies  Social History:   Social History   Socioeconomic History   Marital status: Widowed    Spouse name: Not on file   Number of children: Not on file   Years of education: Not on file   Highest education level: Not on file  Occupational History   Not on file  Social Needs   Financial resource strain: Not on file   Food insecurity    Worry: Not on file    Inability: Not on file   Transportation needs    Medical: Not on file    Non-medical: Not on file  Tobacco Use   Smoking status: Former Smoker    Packs/day: 1.00    Types: Cigarettes    Quit date: 03/23/1987    Years since quitting:  31.6   Smokeless tobacco: Never Used  Substance and Sexual Activity   Alcohol use: No   Drug use: Not on file   Sexual activity: Not on file  Lifestyle   Physical activity    Days per week: Not on file    Minutes per session: Not on file   Stress: Not on file  Relationships   Social connections    Talks on phone: Not on file    Gets together: Not on file    Attends religious service: Not on file    Active member of club or organization: Not on file    Attends meetings of clubs or organizations: Not on file    Relationship status: Not on file   Intimate partner violence    Fear of current or ex partner: Not on file    Emotionally abused: Not on file    Physically abused: Not on file    Forced sexual activity: Not on file  Other Topics Concern   Not on file  Social History Narrative   Not on file    Family History:    Family History  Problem Relation Age of Onset   Heart disease Mother    Diabetes Mother    Heart disease Brother    Diabetes Brother      ROS:  Please see the history of present illness.   All other ROS reviewed and negative.     Physical Exam/Data:   Vitals:   11/23/18 0800 11/23/18 0900 11/23/18 1000 11/23/18 1100  BP: (!) 174/99  (!) 167/82 (!) 176/103 (!) 164/98  Pulse:      Resp: (!) 22 20 (!) 25 (!) 25  Temp: 100.1 F (37.8 C)     TempSrc: Oral     SpO2: 100%     Weight:      Height:        Intake/Output Summary (Last 24 hours) at 11/23/2018 1139 Last data filed at 11/23/2018 0900 Gross per 24 hour  Intake 259.82 ml  Output 745 ml  Net -485.18 ml   Last 3 Weights 11/20/2018 08/11/2011 02/03/2011  Weight (lbs) 102 lb 4.7 oz 121 lb 3.2 oz 121 lb  Weight (kg) 46.4 kg 54.976 kg 54.885 kg     Body mass index is 19.33 kg/m.  General:  elderly thin female, NAD HEENT: normal Neck: no JVD Vascular: No carotid bruits Cardiac:  Irregular rhythm, tachycardic rate, + murmur Lungs:  clear to auscultation bilaterally, no wheezing, rhonchi or rales  Abd: soft, nontender, no hepatomegaly  Ext: no edema Musculoskeletal:  No deformities, BUE and BLE strength normal and equal Skin: warm and dry  Neuro:  Speech difficult to understand Psych:  Normal affect   EKG:  The EKG was personally reviewed and demonstrates:  Sinus tachycardia with HR 114, LVH, ?PACs Telemetry:  Telemetry was personally reviewed and demonstrates:  Bouts of Afib RVR in the 130-140s interspersed with sinus tachycardia in the 100s  Relevant CV Studies:  Echo 11/21/18: IMPRESSIONS  1. Akinesis of the left ventricular, entire inferior wall and inferolateral wall.  2. The left ventricle has moderately reduced systolic function, with an ejection fraction of 35-40%. The cavity size was normal. There is mild concentric left ventricular hypertrophy. Left ventricular diastolic Doppler parameters are consistent with  pseudonormalization. Elevated mean left atrial pressure.  3. The right ventricle has normal systolic function. The cavity was normal. There is no increase in right ventricular wall thickness. Right ventricular systolic pressure could not  be assessed.  4. Left atrial size was mildly dilated.  5. There is mild mitral annular calcification  present.  6. The aorta is normal unless otherwise noted.   Laboratory Data:  High Sensitivity Troponin:   Recent Labs  Lab 11/20/18 1955  TROPONINIHS 70*     Chemistry Recent Labs  Lab 11/20/18 1518 11/20/18 1523 11/20/18 2053 11/21/18 0538 11/22/18 0500  NA 140 141 142 140 142  K 3.9 3.9 4.4 3.9 3.7  CL 106 108  --  112* 108  CO2 20*  --   --  19* 21*  GLUCOSE 167* 163*  --  108* 136*  BUN 20 24*  --  15 16  CREATININE 1.06* 1.00  --  0.98 1.01*  CALCIUM 9.2  --   --  8.3* 8.9  GFRNONAA 51*  --   --  56* 54*  GFRAA 59*  --   --  >60 >60  ANIONGAP 14  --   --  9 13    Recent Labs  Lab 11/20/18 1518  PROT 6.7  ALBUMIN 3.8  AST 19  ALT 32  ALKPHOS 66  BILITOT 0.9   Hematology Recent Labs  Lab 11/20/18 1518  11/20/18 2053 11/21/18 0538 11/22/18 0500  WBC 6.5  --   --  6.2 7.9  RBC 4.25  --   --  3.27* 3.37*  HGB 12.0   < > 10.5* 9.2* 9.4*  HCT 38.0   < > 31.0* 29.0* 29.2*  MCV 89.4  --   --  88.7 86.6  MCH 28.2  --   --  28.1 27.9  MCHC 31.6  --   --  31.7 32.2  RDW 17.2*  --   --  17.1* 16.8*  PLT 234  --   --  224 214   < > = values in this interval not displayed.   BNPNo results for input(s): BNP, PROBNP in the last 168 hours.  DDimer No results for input(s): DDIMER in the last 168 hours.   Radiology/Studies:  Ct Angio Head W Or Wo Contrast  Result Date: 11/20/2018 CLINICAL DATA:  Code stroke left-sided weakness EXAM: CT ANGIOGRAPHY HEAD AND NECK TECHNIQUE: Multidetector CT imaging of the head and neck was performed using the standard protocol during bolus administration of intravenous contrast. Multiplanar CT image reconstructions and MIPs were obtained to evaluate the vascular anatomy. Carotid stenosis measurements (when applicable) are obtained utilizing NASCET criteria, using the distal internal carotid diameter as the denominator. CONTRAST:  23m OMNIPAQUE IOHEXOL 350 MG/ML SOLN COMPARISON:  CT head today FINDINGS: CTA NECK FINDINGS Aortic arch:  Atherosclerotic aortic arch and proximal great vessels. Proximal great vessels patent. Mild stenosis proximal left subclavian artery due to atherosclerotic disease. Right carotid system: Right common carotid artery widely patent. Prior carotid endarterectomy on the right 2008. Bifurcation widely patent. No significant right carotid stenosis. Left carotid system: Scattered atherosclerotic calcification left common carotid artery. Severe atherosclerotic disease at the bifurcation. Severe stenosis at the origin of the external carotid artery. Occlusion of the left internal carotid artery. Note is made of 95% stenosis left internal carotid artery on catheter angiogram 07/08/2004. Vertebral arteries: Left vertebral artery dominant. Calcific plaque and moderate stenosis at the origin of the left vertebral artery. Remainder of the left vertebral artery is patent to the basilar. Right vertebral artery is occluded at the origin with reconstitution of a small vessel at approximately C5. This vessel is patent to the basilar however there is moderate stenosis  distal right vertebral artery. Skeleton: No acute skeletal abnormality. Other neck: Negative for mass or adenopathy. Upper chest: Mild apical scarring and emphysema. No acute abnormality. Review of the MIP images confirms the above findings CTA HEAD FINDINGS Anterior circulation: Extensive atherosclerotic disease and moderate stenosis right cavernous carotid. Occlusion distal right M1 with limited flow in right MCA branches. Anterior cerebral arteries patent bilaterally. Left internal carotid artery is occluded through the cavernous segment. Left anterior cerebral artery patent. Left MCA patent through collateral flow through the anterior communicating artery. No significant left MCA stenosis. Posterior circulation: Left vertebral artery widely patent to the basilar. Left PICA patent. Small right vertebral artery is significantly stenotic in the V4 segment with minimal  contribution to the basilar. Basilar is patent. Posterior cerebral arteries patent bilaterally without stenosis. Venous sinuses: Normal enhancement Anatomic variants: None Review of the MIP images confirms the above findings IMPRESSION: 1. Occlusion right M1 segment distally with poor flow in the right M2 and M3 branches. 2. Postop right carotid endarterectomy 2008. Right carotid bifurcation widely patent 3. Severe atherosclerotic disease left carotid bifurcation with occlusion of the left internal carotid artery. 4. Left anterior and left middle cerebral arteries appear patent without significant stenosis through collateral flow through the anterior communicating artery. 5. Posterior circulation intact 6. These results were called by telephone at the time of interpretation on 11/20/2018 at 3:54 Pm to Dr. Roland Rack , who verbally acknowledged these results. Electronically Signed   By: Franchot Gallo M.D.   On: 11/20/2018 16:06   Ct Angio Neck W Or Wo Contrast  Result Date: 11/20/2018 CLINICAL DATA:  Code stroke left-sided weakness EXAM: CT ANGIOGRAPHY HEAD AND NECK TECHNIQUE: Multidetector CT imaging of the head and neck was performed using the standard protocol during bolus administration of intravenous contrast. Multiplanar CT image reconstructions and MIPs were obtained to evaluate the vascular anatomy. Carotid stenosis measurements (when applicable) are obtained utilizing NASCET criteria, using the distal internal carotid diameter as the denominator. CONTRAST:  50m OMNIPAQUE IOHEXOL 350 MG/ML SOLN COMPARISON:  CT head today FINDINGS: CTA NECK FINDINGS Aortic arch: Atherosclerotic aortic arch and proximal great vessels. Proximal great vessels patent. Mild stenosis proximal left subclavian artery due to atherosclerotic disease. Right carotid system: Right common carotid artery widely patent. Prior carotid endarterectomy on the right 2008. Bifurcation widely patent. No significant right carotid  stenosis. Left carotid system: Scattered atherosclerotic calcification left common carotid artery. Severe atherosclerotic disease at the bifurcation. Severe stenosis at the origin of the external carotid artery. Occlusion of the left internal carotid artery. Note is made of 95% stenosis left internal carotid artery on catheter angiogram 07/08/2004. Vertebral arteries: Left vertebral artery dominant. Calcific plaque and moderate stenosis at the origin of the left vertebral artery. Remainder of the left vertebral artery is patent to the basilar. Right vertebral artery is occluded at the origin with reconstitution of a small vessel at approximately C5. This vessel is patent to the basilar however there is moderate stenosis distal right vertebral artery. Skeleton: No acute skeletal abnormality. Other neck: Negative for mass or adenopathy. Upper chest: Mild apical scarring and emphysema. No acute abnormality. Review of the MIP images confirms the above findings CTA HEAD FINDINGS Anterior circulation: Extensive atherosclerotic disease and moderate stenosis right cavernous carotid. Occlusion distal right M1 with limited flow in right MCA branches. Anterior cerebral arteries patent bilaterally. Left internal carotid artery is occluded through the cavernous segment. Left anterior cerebral artery patent. Left MCA patent through collateral flow through the  anterior communicating artery. No significant left MCA stenosis. Posterior circulation: Left vertebral artery widely patent to the basilar. Left PICA patent. Small right vertebral artery is significantly stenotic in the V4 segment with minimal contribution to the basilar. Basilar is patent. Posterior cerebral arteries patent bilaterally without stenosis. Venous sinuses: Normal enhancement Anatomic variants: None Review of the MIP images confirms the above findings IMPRESSION: 1. Occlusion right M1 segment distally with poor flow in the right M2 and M3 branches. 2. Postop  right carotid endarterectomy 2008. Right carotid bifurcation widely patent 3. Severe atherosclerotic disease left carotid bifurcation with occlusion of the left internal carotid artery. 4. Left anterior and left middle cerebral arteries appear patent without significant stenosis through collateral flow through the anterior communicating artery. 5. Posterior circulation intact 6. These results were called by telephone at the time of interpretation on 11/20/2018 at 3:54 Pm to Dr. Roland Rack , who verbally acknowledged these results. Electronically Signed   By: Franchot Gallo M.D.   On: 11/20/2018 16:06   Mr Brain Wo Contrast  Result Date: 11/21/2018 CLINICAL DATA:  Follow-up right M1 occlusion with intervention. EXAM: MRI HEAD WITHOUT CONTRAST TECHNIQUE: Multiplanar, multiecho pulse sequences of the brain and surrounding structures were obtained without intravenous contrast. COMPARISON:  CT studies 11/20/2018 FINDINGS: Brain: No brainstem abnormality. Old right cerebellar infarction. 8 cm region of acute infarction in the right MCA territory affecting the insula and frontoparietal cortical and subcortical brain. Mild swelling but no visible hemorrhage and no mass effect or shift. There is old infarction in the left occipital lobe and at the left frontoparietal junction vertex. No evidence of mass, hydrocephalus or extra-axial collection. Vascular: Major vessels at the base of the brain show flow. Skull and upper cervical spine: Negative Sinuses/Orbits: Clear/normal Other: None IMPRESSION: 8 cm confluent region of infarction in the right middle cerebral artery territory affecting the insula and frontoparietal brain. Mild swelling but no evidence of hemorrhage at this time or of significant mass effect or any shift. Old infarctions within the right cerebellum, left occipital lobe and at the left frontoparietal vertex. Electronically Signed   By: Nelson Chimes M.D.   On: 11/21/2018 15:17   [place]  Result Date: 11/22/2018 INDICATION: New onset right-sided gaze deviation and left-sided weakness. Right middle cerebral artery M1 occlusion on CT angiogram of the head and neck. EXAM: 1. EMERGENT LARGE VESSEL OCCLUSION THROMBOLYSIS (anterior CIRCULATION) COMPARISON:  CT angiogram of the head and neck of 11/20/2018. MEDICATIONS: Ancef 2 g IV antibiotic was administered within 1 hour of the procedure. ANESTHESIA/SEDATION: General anesthesia. CONTRAST:  Isovue 300 approximately 110 cc. FLUOROSCOPY TIME:  Fluoroscopy Time: 109 minutes 30 seconds (2697 mGy). COMPLICATIONS: None immediate. TECHNIQUE: Following a full explanation of the procedure along with the potential associated complications, an informed witnessed consent was obtained from the patient's son. The risks of intracranial hemorrhage of 10%, worsening neurological deficit, ventilator dependency, death and inability to revascularize were all reviewed in detail with the patient's son. The patient was then put under general anesthesia by the Department of Anesthesiology at [facility]. The left groin was prepped and draped in the usual sterile fashion. Thereafter using modified Seldinger technique, transfemoral access into the left common femoral artery was obtained without difficulty. Over a 0.035 inch guidewire a 5 French Pinnacle sheath was inserted. Through this, and also over a 0.035 inch guidewire a 5 [place] JB 1 catheter was advanced to the aortic arch region and selectively positioned in the innominate  artery and the right common carotid artery. FINDINGS: The innominate artery angiogram demonstrates the origin of the right subclavian artery and the right common carotid artery to be widely patent. There is no visualization angiographically of the right vertebral artery. The right common carotid arteriogram demonstrates the right external carotid artery origin to be mildly narrowed secondary to calcific arteriosclerotic disease. Its branches  opacify normally. The right internal carotid artery at the bulb is widely patent. There is a sharp turn of the proximal right internal carotid artery without evidence of a kink. Distal to this the vessel is seen to opacify to the cranial skull base. The distal petrous segment demonstrates approximately 50% to 60% stenosis. Distal to this the proximal cavernous segment demonstrates a tapered stenosis of approximately 70%. Distal to this there is moderate post-stenotic dilatation. Approximately 50% narrowing is seen of the cavernous segment of the right internal carotid artery. The supraclinoid segment is widely patent. The right middle cerebral artery demonstrates complete occlusion just distal to the origin of the anterior temporal branch. The perforators arising from the proximal right middle cerebral artery are widely patent. The right anterior cerebral artery is widely patent. Opacifying the right anterior cerebral artery distribution, and also cross filling via the anterior communicating artery to supply the left anterior cerebral artery and the left middle cerebral artery distributions. No inflow of unopacified blood is seen in the left internal carotid artery from the proximal aspect. PROCEDURE: The diagnostic JB 1 catheter in the right common carotid artery was exchanged over a 0.035 inch 300 cm Rosen exchange guidewire for an 8 [place] Pinnacle sheath, and through this for a 95 cm 087 Qapel balloon guide catheter which had been prepped with 50% contrast and 50% heparinized saline infusion. An 8 [place] Pinnacle sheath was connected to continuous heparinized saline infusion. The Qapel 087 inch balloon guide catheter was advanced into the proximal 1/3 of the right common carotid artery. The guidewire was removed. Good aspiration obtained from the hub of the 087 balloon guide catheter. A gentle contrast injection demonstrated no evidence of spasms, dissections or of intraluminal filling defects. Over a 0.014 inch  standard Synchro micro guidewire, a combination of a 027 Marksman microcatheter inside of a 6 French 132 cm Catalyst guide catheter was advanced to the distal end of the 087 balloon guide catheter. With the micro guidewire leading with a J-tip configuration, the combination was navigated to the proximal petrous segment of the right internal carotid artery. Thereafter, the micro guidewire was gently manipulated to advance through a hard arteriosclerotic plaque in the petrous cavernous segment associated with a 50% stenosis, and a 60-70% stenosis at the caval cavernous segment. The micro guidewire was then eventually advanced into the distal cavernous and into the right M1 segment. Over the wire the microcatheter was advanced and the two were then advanced into the dominant inferior division of the right middle cerebral artery M2 M3 region followed by the microcatheter. The guidewire was removed. Good aspiration was obtained from the hub of the Marksman microcatheter. A gentle control arteriogram performed through the microcatheter demonstrated safe positioning of the tip of the microcatheter. A 5 mm x 33 mm Embotrap retrieval device was then advanced to the distal end of the microcatheter. The O ring on the delivery microcatheter was then loosened. With slight forward gentle traction with the right hand on the delivery micro guidewire, with the left hand the delivery microcatheter was retrieved unsheathing the device. Control arteriogram performed through the [place]  guide catheter in the petrous cavernous junction demonstrated no continued occlusion of the right middle cerebral artery. The 6 [place] Catalyst guide catheter was slowly advanced with moderate resistance to the distal cavernous segment. Proximal flow arrest was initiated by inflating the balloon of the 087 balloon guide catheter. Penumbra aspiration was started at the hub of the 6 [place] Catalyst guide catheter with slow aspiration of blood.  This was continued for approximately 2 minutes. Thereafter, the combination of the retrieval device, the microcatheter, and the 6 [place] Catalyst guide catheter were retrieved and removed. Following reversal of flow arrest, a control arteriogram performed through the 087 balloon guide catheter in the right internal carotid artery demonstrated continued occlusion of the right middle cerebral artery without any change. This was then followed by a second pass made again using the above combination and advanced again to the petrous cavernous segment of the right internal carotid artery. Again with moderate difficulty, access with the micro guidewire and the microcatheter was obtained into the right middle cerebral artery because of the extensive arteriosclerotic disease with narrowing in the petrous cavernous, and the caval cavernous segments as described above. The microcatheter was advanced again into the M2 M3 inferior division of the right middle cerebral artery followed by the microcatheter. The guidewire was removed. After verifying safe position of tip of the microcatheter, a 5 mm x 33 mm Embotrap retrieval device was again deployed as mentioned previously. The 6 [place] Catalyst guide catheter could only be advanced to the distal cavernous segment on account of severe tortuosity, and the stenosis of the right internal carotid artery at the cranial skull base. Again with proximal flow arrest, and constant aspiration at the hub of the 6 [place] Catalyst guide catheter with a Penumbra aspiration device for 2 minutes, the combination was retrieved and removed as described previously. Following reversal of flow arrest, a control arteriogram performed through the 087 balloon guide catheter in the right internal artery demonstrated marginal improvement of the right middle cerebral artery occlusion to a TICI 2A revascularization. It was evident that the overlying occlusion was most likely associated to hard intracranial  arteriosclerosis. It was decided to proceed to place a rescue stent in the right middle cerebral artery. Initially an 021 150 cm Trevo ProVue microcatheter was advanced in combination with a Catalyst 6 [place] guide catheter over a 0.014 inch standard Synchro micro guidewire to the petrous cavernous segment. Attempts with this wire and this microcatheter passed the severe arteriosclerotic changes and stenosis in the petrous cavernous, and caval cavernous segments was unsuccessful. This was then replaced with a Marksman 027 microcatheter over a 0.016 inch Fathom micro guidewire. The combination was then advanced to the petrous cavernous segment. With persistent manipulation and advancement of the microcatheter and the 6 [place] Catalyst guide catheter, eventual access was obtained into the supraclinoid right ICA followed by the microcatheter and then the micro guidewire. The micro guidewire was advanced now the superior division of the right middle cerebral artery. However, advancement of the microcatheter was met with resistance. The microcatheter and the superior division was again retrieved until eventually there was free aspiration of blood. A gentle control arteriogram performed through the microcatheter demonstrated antegrade flow albeit slow. No evidence of extravasation was seen. A 4.5 mm x 24 mm Neuroform stent was then advanced to the distal end of the microcatheter. However, this could not be deployed safely on account of the severe tortuosity and lack of stability related to this. The microcatheter was then removed.  Following further discussion with the referring neurologist it was decided to stop the procedure. A final control arteriogram performed through the [place] 087 bone guide catheter in right common carotid artery demonstrated free flow via the internal carotid artery to the petrous cavernous junction. Again demonstrated was extensive ulcerated plaque in the caval cavernous segment and the petrous  cavernous segment associated with the stenosis in this region as described above. The right middle cerebral artery now demonstrated moderately improved flow with the improved flow in the anterior temporal branch and also the perforators arising medial to this. There was flash filling of the superior division as well. The right anterior cerebral artery, the anterior communicating artery, and the left anterior cerebral artery and the left middle cerebral artery distributions remained essentially unchanged with no gross evidence of occlusions, stenosis, or of intraluminal filling defects. Throughout the procedure, the patient's blood pressure and neurological status remained stable. The balloon guide was removed. Angiogram obtained through the left common femoral artery 8 French Pinnacle sheath demonstrated no restrictive flow in the distal runoff. The left posterior tibial, and left dorsalis pedis arteries remained Dopplerable unchanged. The right posterior tibial, and the dorsalis pedis pulses remained non Dopplerable unchanged from the beginning of the procedure. 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 A flat panel CT of the brain demonstrated no evidence of gross hemorrhages, mass effect or midline shift. No evidence of extravasation of contrast or mass-effect was noted during the procedure. The patient was left intubated and transferred to the neuro ICU to continue with further care. IMPRESSION: Status post endovascular partial mild revascularization of occluded right middle cerebral artery with 2 passes with 5 mm x 33 mm Embotrap retrieval device and Penumbra aspiration achieving a TICI 2A revascularization. Occluded right vertebral artery, and left internal carotid artery chronic. PLAN: As per referring MD. Electronically Signed   By: Luanne Bras M.D.   On: 11/21/2018 12:48   [place]  Result Date: 11/22/2018 INDICATION: New onset right-sided gaze deviation and left-sided weakness. Right middle cerebral artery M1 occlusion on CT angiogram of the head and neck. EXAM: 1. EMERGENT LARGE VESSEL OCCLUSION THROMBOLYSIS (anterior CIRCULATION) COMPARISON:  CT angiogram of the head and neck of 11/20/2018. MEDICATIONS: Ancef 2 g IV antibiotic was administered within 1 hour of the procedure. ANESTHESIA/SEDATION: General anesthesia. CONTRAST:  Isovue 300 approximately 110 cc. FLUOROSCOPY TIME:  Fluoroscopy Time: 109 minutes 30 seconds (2697 mGy). COMPLICATIONS: None immediate. TECHNIQUE: Following a full explanation of the procedure along with the potential associated complications, an informed witnessed consent was obtained from the patient's son. The risks of intracranial hemorrhage of 10%, worsening neurological deficit, ventilator dependency, death and inability to revascularize were all reviewed in detail with the patient's son. The patient was then put under general anesthesia by the Department of Anesthesiology at [facility]. The left groin was prepped and draped in the usual sterile fashion. Thereafter using modified Seldinger technique, transfemoral access into the left common femoral artery was obtained without difficulty. Over a 0.035 inch guidewire a 5 French Pinnacle sheath was inserted. Through this, and also over a 0.035 inch guidewire a 5 [place] JB 1 catheter was advanced to the aortic arch region and selectively positioned in the innominate artery and the right common carotid artery. FINDINGS: The innominate artery angiogram demonstrates the origin of the right subclavian artery and the right common carotid  artery to be widely patent. There is no visualization angiographically of the right vertebral artery. The right common carotid arteriogram demonstrates the right external carotid artery origin to be mildly narrowed secondary to calcific arteriosclerotic disease. Its branches  opacify normally. The right internal carotid artery at the bulb is widely patent. There is a sharp turn of the proximal right internal carotid artery without evidence of a kink. Distal to this the vessel is seen to opacify to the cranial skull base. The distal petrous segment demonstrates approximately 50% to 60% stenosis. Distal to this the proximal cavernous segment demonstrates a tapered stenosis of approximately 70%. Distal to this there is moderate post-stenotic dilatation. Approximately 50% narrowing is seen of the cavernous segment of the right internal carotid artery. The supraclinoid segment is widely patent. The right middle cerebral artery demonstrates complete occlusion just distal to the origin of the anterior temporal branch. The perforators arising from the proximal right middle cerebral artery are widely patent. The right anterior cerebral artery is widely patent. Opacifying the right anterior cerebral artery distribution, and also cross filling via the anterior communicating artery to supply the left anterior cerebral artery and the left middle cerebral artery distributions. No inflow of unopacified blood is seen in the left internal carotid artery from the proximal aspect. PROCEDURE: The diagnostic JB 1 catheter in the right common carotid artery was exchanged over a 0.035 inch 300 cm Rosen exchange guidewire for an 8 [place] Pinnacle sheath, and through this for a 95 cm 087 [place] balloon guide catheter which had been prepped with 50% contrast and 50% heparinized saline infusion. An 8 [place] Pinnacle sheath was connected to continuous heparinized saline infusion. The [place] 087 inch balloon guide catheter was advanced into the proximal 1/3 of the right common carotid artery. The guidewire was removed. Good aspiration obtained from the hub of the 087 balloon guide catheter. A gentle contrast injection demonstrated no evidence of spasms, dissections or of intraluminal filling defects. Over a 0.014 inch  standard Synchro micro guidewire, a combination of a 027 Marksman microcatheter inside of a 6 French 132 cm Catalyst guide catheter was advanced to the distal end of the 087 balloon guide catheter. With the micro guidewire leading with a J-tip configuration, the combination was navigated to the proximal petrous segment of the right internal carotid artery. Thereafter, the micro guidewire was gently manipulated to advance through a hard arteriosclerotic plaque in the petrous cavernous segment associated with a 50% stenosis, and a 60-70% stenosis at the caval cavernous segment. The micro guidewire was then eventually advanced into the distal cavernous and into the right M1 segment. Over the wire the microcatheter was advanced and the two were then advanced into the dominant inferior division of the right middle cerebral artery M2 M3 region followed by the microcatheter. The guidewire was removed. Good aspiration was obtained from the hub of the Marksman microcatheter. A gentle control arteriogram performed through the microcatheter demonstrated safe positioning of the tip of the microcatheter. A 5 mm x 33 mm Embotrap retrieval device was then advanced to the distal end of the microcatheter. The O ring on the delivery microcatheter was then loosened. With slight forward gentle traction with the right hand on the delivery micro guidewire, with the left hand the delivery microcatheter was retrieved unsheathing the device. Control arteriogram performed through the 6 [place] Catalyst guide catheter in the petrous cavernous junction demonstrated no continued occlusion of the right middle cerebral artery. The 6 [place] Catalyst guide catheter was slowly  advanced with moderate resistance to the distal cavernous segment. Proximal flow arrest was initiated by inflating the balloon of the 087 balloon guide catheter. Penumbra aspiration was started at the hub of the 6 [place] Catalyst guide catheter with slow aspiration of blood.  This was continued for approximately 2 minutes. Thereafter, the combination of the retrieval device, the microcatheter, and the 6 [place] Catalyst guide catheter were retrieved and removed. Following reversal of flow arrest, a control arteriogram performed through the 087 balloon guide catheter in the right internal carotid artery demonstrated continued occlusion of the right middle cerebral artery without any change. This was then followed by a second pass made again using the above combination and advanced again to the petrous cavernous segment of the right internal carotid artery. Again with moderate difficulty, access with the micro guidewire and the microcatheter was obtained into the right middle cerebral artery because of the extensive arteriosclerotic disease with narrowing in the petrous cavernous, and the caval cavernous segments as described above. The microcatheter was advanced again into the M2 M3 inferior division of the right middle cerebral artery followed by the microcatheter. The guidewire was removed. After verifying safe position of tip of the microcatheter, a 5 mm x 33 mm Embotrap retrieval device was again deployed as mentioned previously. The 6 [place] Catalyst guide catheter could only be advanced to the distal cavernous segment on account of severe tortuosity, and the stenosis of the right internal carotid artery at the cranial skull base. Again with proximal flow arrest, and constant aspiration at the hub of the 6 [place] Catalyst guide catheter with a Penumbra aspiration device for 2 minutes, the combination was retrieved and removed as described previously. Following reversal of flow arrest, a control arteriogram performed through the 087 balloon guide catheter in the right internal artery demonstrated marginal improvement of the right middle cerebral artery occlusion to a TICI 2A revascularization. It was evident that the overlying occlusion was most likely associated to hard intracranial  arteriosclerosis. It was decided to proceed to place a rescue stent in the right middle cerebral artery. Initially an 021 150 cm Trevo ProVue microcatheter was advanced in combination with a Catalyst 6 [place] guide catheter over a 0.014 inch standard Synchro micro guidewire to the petrous cavernous segment. Attempts with this wire and this microcatheter passed the severe arteriosclerotic changes and stenosis in the petrous cavernous, and caval cavernous segments was unsuccessful. This was then replaced with a Marksman 027 microcatheter over a 0.016 inch Fathom micro guidewire. The combination was then advanced to the petrous cavernous segment. With persistent manipulation and advancement of the microcatheter and the 6 [place] Catalyst guide catheter, eventual access was obtained into the supraclinoid right ICA followed by the microcatheter and then the micro guidewire. The micro guidewire was advanced now the superior division of the right middle cerebral artery. However, advancement of the microcatheter was met with resistance. The microcatheter and the superior division was again retrieved until eventually there was free aspiration of blood. A gentle control arteriogram performed through the microcatheter demonstrated antegrade flow albeit slow. No evidence of extravasation was seen. A 4.5 mm x 24 mm Neuroform stent was then advanced to the distal end of the microcatheter. However, this could not be deployed safely on account of the severe tortuosity and lack of stability related to this. The microcatheter was then removed. Following further discussion with the referring neurologist it was decided to stop the procedure. A final control arteriogram performed through the [place] 087 bone guide  catheter in right common carotid artery demonstrated free flow via the internal carotid artery to the petrous cavernous junction. Again demonstrated was extensive ulcerated plaque in the caval cavernous segment and the petrous  cavernous segment associated with the stenosis in this region as described above. The right middle cerebral artery now demonstrated moderately improved flow with the improved flow in the anterior temporal branch and also the perforators arising medial to this. There was flash filling of the superior division as well. The right anterior cerebral artery, the anterior communicating artery, and the left anterior cerebral artery and the left middle cerebral artery distributions remained essentially unchanged with no gross evidence of occlusions, stenosis, or of intraluminal filling defects. Throughout the procedure, the patient's blood pressure and neurological status remained stable. The balloon guide was removed. Angiogram obtained through the left common femoral artery 8 French Pinnacle sheath demonstrated no restrictive flow in the distal runoff. The left posterior tibial, and left dorsalis pedis arteries remained Dopplerable unchanged. The right posterior tibial, and the dorsalis pedis pulses remained non Dopplerable unchanged from the beginning of the procedure. 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 A flat panel CT of the brain demonstrated no evidence of gross hemorrhages, mass effect or midline shift. No evidence of extravasation of contrast or mass-effect was noted during the procedure. The patient was left intubated and transferred to the neuro ICU to continue with further care. IMPRESSION: Status post endovascular partial mild revascularization of occluded right middle cerebral artery with 2 passes with 5 mm x 33 mm Embotrap retrieval device and Penumbra aspiration achieving a TICI 2A revascularization. Occluded right vertebral artery, and left internal carotid artery chronic. PLAN: As per referring MD. Electronically Signed   By: Luanne Bras M.D.   On: 11/21/2018 12:48   Portable  Chest Xray  Result Date: 11/21/2018 CLINICAL DATA:  Status post endotracheal intubation EXAM: PORTABLE CHEST 1 VIEW COMPARISON:  11/20/2018 FINDINGS: Cardiac shadow is enlarged but stable. Aortic calcifications are again noted. Endotracheal tube has been repositioned in satisfactory location. The lungs are well aerated without focal infiltrate or sizable effusion. No acute bony abnormality is noted. IMPRESSION: Endotracheal tube in satisfactory position. No acute abnormality noted. Electronically Signed   By: Inez Catalina M.D.   On: 11/21/2018 07:35   Dg Chest Port 1 View  Result Date: 11/20/2018 CLINICAL DATA:  Intubation. EXAM: PORTABLE CHEST 1 VIEW COMPARISON:  11/16/2018 FINDINGS: The endotracheal tube terminates approximately 1.5 cm above the carina. Heart size remains enlarged. There is generalized volume overload without overt pulmonary edema. There is pleuroparenchymal scarring at the lung apices. There are trace bilateral pleural effusions. IMPRESSION: 1. The endotracheal tube is borderline low. Repositioning should be considered. 2. Cardiomegaly with mild vascular congestion. Electronically Signed   By: Constance Holster M.D.   On: 11/20/2018 20:49   Ir Percutaneous Art Thrombectomy/infusion Intracranial Inc Diag Angio  Result Date: 11/22/2018 INDICATION: New onset right-sided gaze deviation and left-sided weakness. Right middle cerebral artery M1 occlusion on CT angiogram of the head and neck. EXAM: 1. EMERGENT LARGE VESSEL OCCLUSION THROMBOLYSIS (anterior CIRCULATION) COMPARISON:  CT angiogram of the head and neck of 11/20/2018. MEDICATIONS: Ancef 2 g IV antibiotic was administered within 1 hour of the procedure. ANESTHESIA/SEDATION: General anesthesia. CONTRAST:  Isovue 300 approximately 110 cc. FLUOROSCOPY TIME:  Fluoroscopy Time: 109 minutes 30 seconds (2697 mGy). COMPLICATIONS: None immediate. TECHNIQUE: Following a full  explanation of the procedure along with the potential associated  complications, an informed witnessed consent was obtained from the patient's son. The risks of intracranial hemorrhage of 10%, worsening neurological deficit, ventilator dependency, death and inability to revascularize were all reviewed in detail with the patient's son. The patient was then put under general anesthesia by the Department of Anesthesiology at [facility]. The left groin was prepped and draped in the usual sterile fashion. Thereafter using modified Seldinger technique, transfemoral access into the left common femoral artery was obtained without difficulty. Over a 0.035 inch guidewire a 5 [place] Pinnacle sheath was inserted. Through this, and also over a 0.035 inch guidewire a 5 [place] JB 1 catheter was advanced to the aortic arch region and selectively positioned in the innominate artery and the right common carotid artery. FINDINGS: The innominate artery angiogram demonstrates the origin of the right subclavian artery and the right common carotid artery to be widely patent. There is no visualization angiographically of the right vertebral artery. The right common carotid arteriogram demonstrates the right external carotid artery origin to be mildly narrowed secondary to calcific arteriosclerotic disease. Its branches opacify normally. The right internal carotid artery at the bulb is widely patent. There is a sharp turn of the proximal right internal carotid artery without evidence of a kink. Distal to this the vessel is seen to opacify to the cranial skull base. The distal petrous segment demonstrates approximately 50% to 60% stenosis. Distal to this the proximal cavernous segment demonstrates a tapered stenosis of approximately 70%. Distal to this there is moderate post-stenotic dilatation. Approximately 50% narrowing is seen of the cavernous segment of the right internal carotid artery. The supraclinoid segment is widely patent. The right middle cerebral artery demonstrates complete  occlusion just distal to the origin of the anterior temporal branch. The perforators arising from the proximal right middle cerebral artery are widely patent. The right anterior cerebral artery is widely patent. Opacifying the right anterior cerebral artery distribution, and also cross filling via the anterior communicating artery to supply the left anterior cerebral artery and the left middle cerebral artery distributions. No inflow of unopacified blood is seen in the left internal carotid artery from the proximal aspect. PROCEDURE: The diagnostic JB 1 catheter in the right common carotid artery was exchanged over a 0.035 inch 300 cm Rosen exchange guidewire for an 8 [place] Pinnacle sheath, and through this for a 95 cm 087 [place] balloon guide catheter which had been prepped with 50% contrast and 50% heparinized saline infusion. An 8 [place] Pinnacle sheath was connected to continuous heparinized saline infusion. The [place] 087 inch balloon guide catheter was advanced into the proximal 1/3 of the right common carotid artery. The guidewire was removed. Good aspiration obtained from the hub of the 087 balloon guide catheter. A gentle contrast injection demonstrated no evidence of spasms, dissections or of intraluminal filling defects. Over a 0.014 inch standard Synchro micro guidewire, a combination of a 027 Marksman microcatheter inside of a 6 [place] 132 cm Catalyst guide catheter was advanced to the distal end of the 087 balloon guide catheter. With the micro guidewire leading with a J-tip configuration, the combination was navigated to the proximal petrous segment of the right internal carotid artery. Thereafter, the micro guidewire was gently manipulated to advance through a hard arteriosclerotic plaque in the petrous cavernous segment associated with a 50% stenosis, and a 60-70% stenosis at the caval cavernous segment. The micro guidewire was then eventually advanced into the distal cavernous  and into the right M1  segment. Over the wire the microcatheter was advanced and the two were then advanced into the dominant inferior division of the right middle cerebral artery M2 M3 region followed by the microcatheter. The guidewire was removed. Good aspiration was obtained from the hub of the Marksman microcatheter. A gentle control arteriogram performed through the microcatheter demonstrated safe positioning of the tip of the microcatheter. A 5 mm x 33 mm Embotrap retrieval device was then advanced to the distal end of the microcatheter. The O ring on the delivery microcatheter was then loosened. With slight forward gentle traction with the right hand on the delivery micro guidewire, with the left hand the delivery microcatheter was retrieved unsheathing the device. Control arteriogram performed through the 6 [place] Catalyst guide catheter in the petrous cavernous junction demonstrated no continued occlusion of the right middle cerebral artery. The 6 [place] Catalyst guide catheter was slowly advanced with moderate resistance to the distal cavernous segment. Proximal flow arrest was initiated by inflating the balloon of the 087 balloon guide catheter. Penumbra aspiration was started at the hub of the 6 [place] Catalyst guide catheter with slow aspiration of blood. This was continued for approximately 2 minutes. Thereafter, the combination of the retrieval device, the microcatheter, and the 6 [place] Catalyst guide catheter were retrieved and removed. Following reversal of flow arrest, a control arteriogram performed through the 087 balloon guide catheter in the right internal carotid artery demonstrated continued occlusion of the right middle cerebral artery without any change. This was then followed by a second pass made again using the above combination and advanced again to the petrous cavernous segment of the right internal carotid artery. Again with moderate difficulty, access with the micro guidewire and the microcatheter was  obtained into the right middle cerebral artery because of the extensive arteriosclerotic disease with narrowing in the petrous cavernous, and the caval cavernous segments as described above. The microcatheter was advanced again into the M2 M3 inferior division of the right middle cerebral artery followed by the microcatheter. The guidewire was removed. After verifying safe position of tip of the microcatheter, a 5 mm x 33 mm Embotrap retrieval device was again deployed as mentioned previously. The 6 [place] Catalyst guide catheter could only be advanced to the distal cavernous segment on account of severe tortuosity, and the stenosis of the right internal carotid artery at the cranial skull base. Again with proximal flow arrest, and constant aspiration at the hub of the 6 [place] Catalyst guide catheter with a Penumbra aspiration device for 2 minutes, the combination was retrieved and removed as described previously. Following reversal of flow arrest, a control arteriogram performed through the 087 balloon guide catheter in the right internal artery demonstrated marginal improvement of the right middle cerebral artery occlusion to a TICI 2A revascularization. It was evident that the overlying occlusion was most likely associated to hard intracranial arteriosclerosis. It was decided to proceed to place a rescue stent in the right middle cerebral artery. Initially an 021 150 cm Trevo ProVue microcatheter was advanced in combination with a Catalyst 6 [place] guide catheter over a 0.014 inch standard Synchro micro guidewire to the petrous cavernous segment. Attempts with this wire and this microcatheter passed the severe arteriosclerotic changes and stenosis in the petrous cavernous, and caval cavernous segments was unsuccessful. This was then replaced with a Marksman 027 microcatheter over a 0.016 inch Fathom micro guidewire. The combination was then advanced to the petrous cavernous segment. With persistent manipulation  and advancement of the microcatheter and the 6 [place] Catalyst guide catheter, eventual access was obtained into the supraclinoid right ICA followed by the microcatheter and then the micro guidewire. The micro guidewire was advanced now the superior division of the right middle cerebral artery. However, advancement of the microcatheter was met with resistance. The microcatheter and the superior division was again retrieved until eventually there was free aspiration of blood. A gentle control arteriogram performed through the microcatheter demonstrated antegrade flow albeit slow. No evidence of extravasation was seen. A 4.5 mm x 24 mm Neuroform stent was then advanced to the distal end of the microcatheter. However, this could not be deployed safely on account of the severe tortuosity and lack of stability related to this. The microcatheter was then removed. Following further discussion with the referring neurologist it was decided to stop the procedure. A final control arteriogram performed through the [place] 087 bone guide catheter in right common carotid artery demonstrated free flow via the internal carotid artery to the petrous cavernous junction. Again demonstrated was extensive ulcerated plaque in the caval cavernous segment and the petrous cavernous segment associated with the stenosis in this region as described above. The right middle cerebral artery now demonstrated moderately improved flow with the improved flow in the anterior temporal branch and also the perforators arising medial to this. There was flash filling of the superior division as well. The right anterior cerebral artery, the anterior communicating artery, and the left anterior cerebral artery and the left middle cerebral artery distributions remained essentially unchanged with no gross evidence of occlusions, stenosis, or of intraluminal filling defects. Throughout the procedure, the patient's blood pressure and neurological status remained  stable. The balloon guide was removed. Angiogram obtained through the left common femoral artery 8 French Pinnacle sheath demonstrated no restrictive flow in the distal runoff. The left posterior tibial, and left dorsalis pedis arteries remained Dopplerable unchanged. The right posterior tibial, and the dorsalis pedis pulses remained non Dopplerable unchanged from the beginning of the procedure. 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 A flat panel CT of the brain demonstrated no evidence of gross hemorrhages, mass effect or midline shift. No evidence of extravasation of contrast or mass-effect was noted during the procedure. The patient was left intubated and transferred to the neuro ICU to continue with further care. IMPRESSION: Status post endovascular partial mild revascularization of occluded right middle cerebral artery with 2 passes with 5 mm x 33 mm Embotrap retrieval device and Penumbra aspiration achieving a TICI 2A revascularization. Occluded right vertebral artery, and left internal carotid artery chronic. PLAN: As per referring MD. Electronically Signed   By: Luanne Bras M.D.   On: 11/21/2018 12:48   Ct Head Code Stroke Wo Contrast  Result Date: 11/20/2018 CLINICAL DATA:  Code stroke. Altered level of consciousness. Stroke. ESRD. Carotid endarterectomy on the right 2008 EXAM: CT HEAD WITHOUT CONTRAST TECHNIQUE: Contiguous axial images were obtained from the base of the skull through the vertex without intravenous contrast. COMPARISON:  None. FINDINGS: Brain: Generalized atrophy without hydrocephalus. Chronic infarct left occipital lobe. Small chronic infarct right cerebellum. Negative for acute infarct, hemorrhage, mass Vascular: Subtle hyperdensity right M1 bifurcation in the sylvian fissure. Skull: Negative Sinuses/Orbits: Negative Other: None ASPECTS ([place] Stroke Program  Early CT Score) - Ganglionic level infarction (caudate, lentiform nuclei, internal capsule, insula, M1-M3 cortex): 7 - Supraganglionic infarction (M4-M6 cortex): 3  Total score (0-10 with 10 being normal): 10 IMPRESSION: 1. Subtle hyperdense right MCA. There is occlusion of the right distal M1 on CTA today. 2. ASPECTS is 10 3. No acute infarct or hemorrhage 4. These results were called by telephone at the time of interpretation on 11/20/2018 at 3:54 pm to Dr. Leonel Ramsay , who verbally acknowledged these results. Electronically Signed   By: Franchot Gallo M.D.   On: 11/20/2018 15:54    Assessment and Plan:   1. Atrial fibrillation RVR - new diagnosis - on lopressor 25 mg BID - on hold for NPO status until Coretrack or NG tube can be placed - cardizem drip running at 15 mg/hr - started at 11:12am today - difficult situation given the patient's paroxysmal Afib and NPO status - continue cardizem drip  - if she has persistent RVR, may need to initiate amiodarone, although she is not an anticoagulation candidate at this time  - This patients CHA2DS2-VASc Score and unadjusted Ischemic Stroke Rate (% per year) is equal to 12.2 % stroke rate/year from a score of 9 (CHF, HTN, DM, MI, age2, stroke2, female) - she would benefit from anticoagulation when safe to initiate, per neurology - Mg 1.9 - TSH pending  2. Hypertension - systolic BP > 893 in ER - regimen includes HCTZ 12.5 mg daily, irbesartan 300 mg daily  - has been 810-175Z systolic today - permissive HTN < 180/105 - per neurology  3. CVA - per neuro - permissive HTN - not an AC candidate  4. Dysphagia - NPO  - speech eval, awaiting coretrack - nursing to place NG tube for access  5. Chronic systolic heart failure - history of MI, although details are not well-understood - EF this admission 30-35%, which appears stable from prior echo in [place] - unclear etiology - home regimen included:  -- 20 mg lasix every other day --  clonidine 0.24m at bedtime -- labetalol 400 mg BID -- entresto 49-51 mg BID - does not appear extremely volume overloaded - caution with IVF  6. Hyperlipidemia - continue zocor - 11/21/2018: Cholesterol 124; HDL 39; LDL Cholesterol 72; VLDL 13 11/23/2018: Triglycerides 106  7. CAD - remote MI - unclear intervention - given Afib and cardiomyopathy, may benefit from a lexiscan myoview at some point    For questions or updates, please contact CSheltonPlease consult [email or web address] for contact info under   Signed, ALedora Bottcher PA  11/23/2018 11:39 AM  The patient was seen, examined and discussed with AMinette Brine, PA-C and I agree with the above.   78y.o. female with a hx of CAD, MI 20 years ago ([facility]), cardiomyopathy with EF of 30-35% by echo, bilateral carotid artery stenosis with left ICA occlsuion and right carotid endarterectomy (2008), CVA (?2015), HTN, HLD, DM, and mitral valve insufficiency  who is being seen today for the evaluation of atrial fibrillation.  She saw vascular surgery in 2018 for carotid artery stenosis. She has complete occlusion of her left ICA and 40-59% stenosis in the R ICA. No intervention at that time. 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 Notes reveal an echocardiogram with an EF of 30%, unknown if this is ischemic in nature.  Per son she was on anticoagulation for a while but does not know when it was discontinued.  He states that she has been fully active taking care of her grand daughter, driving, cooking and full independent until Monday when he found  her down laying in her bed, she presented to [facility] with left-sided neglect and eye deviation to the right. CT head with R M1 occlusion and family decided to proceed with thrombectomy/TPA. She was hypertensive and tachycardic. She was intubated prior to the procedure. Unfortunately, revascularization was unsuccessful.   Her telemetry shows that she is in and out of atrial  fibrillation with RVR, currently in sinus tachycardia with frequent PACs and PVCs.  She is also hypertensive with blood pressure in 170s.   On physical exam she is somnolent, S1-S2 tachycardic no significant murmur, her lungs are clear, extremities are warm with no edema.  She is n.p.o. as she has not passed swallow study yet. At this point I would restart Cardizem drip and start heparin drip, once she is cleared to take p.o. meds I would switch to beta-blocker and add ACE inhibitor or ARB given underlying LV dysfunction. We will follow.   Ena Dawley, MD 11/23/2018

## 2018-11-23 NOTE — Progress Notes (Signed)
PT Cancellation Note  Patient Details Name: [name] MRN: [id number] DOB: 1941-03-15   Cancelled Treatment:    Reason Eval/Treat Not Completed: Patient not medically ready;Active bedrest order (HR ranging from 130-167 bpm at rest).  Ellamae Sia, PT, DPT Acute Rehabilitation Services Pager [phone] Office [phone]    Willy Eddy 11/23/2018, 10:02 AM

## 2018-11-23 NOTE — Progress Notes (Signed)
NAME:  [name], MRN:  [id number], DOB:  Mar 24, 1940, LOS: 3 ADMISSION DATE:  11/20/2018, CONSULTATION DATE:  11/20/18 REFERRING MD:  Dr. Leonel Ramsay, CHIEF COMPLAINT:  Facial droop   Brief History   78 y.o. F who lives alone who was found in her bed by her son with L-sided neglect and eye deviation to the R.  Last known well was 12:00 on 8/31.  CTA done in the ED and showed R M1 occlusion and pt and family decided to proceed with thrombectomy.  She was intubated pre-procedure and PCCM was consulted. Revascularization was unsuccessful, patient remained intubated until successful extubation on 9/2.  History of present illness   [name] is a 78 y.o. F with PMH of CAD and MI, HTN, HL, cardiomyopathy, Type II DM, and bilateral carotid artery stenosis s/p right carotid endarterectomy in 2008 who was found by her son 8/31 in bed with flaccid L side, L facial droop and slurred speech.  She lives independently and he had last spoken to her on the phone around noon today and she seemed normal.  Her home medications include Asa 81mg  and Simvastatin 20mg .   In the ED she was treated with IV TPA, completed at 3:45 pm. CTA head significant for R M1 occlusion and son opted to move forward with thrombectomy.  She was hypertensive and tachycardic with max BP of 222/128, treated with Labetalol 20mg  IV. Glucose was elevated at 167,  and Lab results were otherwise non-actionable .  EKG showed sinus tachycardia with prolonged Qtc 510.  She was intubated and taken to IR.  In IR she had right common carotid arteriogram with unsuccessful revascularization despite 2 passes with embotrap retriever device, unsuccessful intracranial rescue stenting due to vascular tortuosity. Successful extubation on 9/2, now on room air.  Past Medical History  CAD s/p MI, cardiomyopathy, HTN, HL, carotid stenosis, DM, mitral valve insufficiency, ? A.fib  Significant Hospital Events   8/31 Admission, unsuccessful IR  revascularization attempt 9/2 Patient extubated and weaned to room air   Consults:  PCCM  Procedures:  ETT 8/31  L fem arterial sheath 8/31, sheath removed 9/1  Significant Diagnostic Tests:  8/31 CT/ CTA head>Hyperdense R MCA, occlusion of the R distal M1, no hemorrhage, severe atherosclerotic disease with occlusion of the L internal carotid  9/1 MRI- 8 cm confluent R MCA infarction affecting insula and frontoparietal brain. Mild swelling, no hemorrhage or mass effect. Old infarctions in R cerebellum, L occipital lobe, and L frontoparietal vertex.  9/1 Echo- Akinesis of entire LV inferior and inferolateral walls, EF of 35-40%, mild concentric LV hypertrophy. Mild LA dilation with increased mean pressure. Mild mitral annular calcification.   Micro Data:  8/31 Sars-CoV-2 >> neg  Antimicrobials:  None.  Interim history/subjective:  Extubated yesterday to 4L [place] with no respiratory distress and successfully weaned to room air. Nursing report a bout of atrial fibrillation at 10 PM with pulses in the 120s to 140s. She was given a 1x dose of 5 mg metoprolol. This AM, continues to have runs of afib with pulses in the 110s to 130s. Objective   Blood pressure (!) 167/82, pulse (!) 109, temperature 100.1 F (37.8 C), temperature source Oral, resp. rate 20, height 5\' 1"  (1.549 m), weight 46.4 kg, SpO2 100 %.    Vent Mode: PRVC FiO2 (%):  [40 %] 40 % Set Rate:  [16 bmp] 16 bmp Vt Set:  [380 mL] 380 mL PEEP:  [5 cmH20] 5 cmH20 Plateau Pressure:  [  15 cmH20] 15 cmH20   Intake/Output Summary (Last 24 hours) at 11/23/2018 0951 Last data filed at 11/23/2018 0900 Gross per 24 hour  Intake 280.75 ml  Output 745 ml  Net -464.25 ml   Filed Weights   11/20/18 1500  Weight: 46.4 kg    Examination: General: Elderly woman, awake in bed with eyes open. HENT: Normocephalic and atraumatic. Anicteric sclera. Neck- Supple. Significant JVD appreciated to the mandible at 45 degrees, significantly  increased from prior exams. Lungs:  Clear to auscultation with symmetric chest wall expansion and normal breathing on room air. Cardiovascular: Irregular rate and rhythm, PAC's with periodic atrial fibrillation noted on telemetry. Previous holosystolic murmur not appreciated one exam today. No carotid bruits. 1+ radial pulses. Unable to appreciate dorsalis pedis, posterior tibialis pulses bilaterally on exam. Abdomen: Non-tender, non-distended. Hyperactive bowel sounds. Extremities: No lower extremity edema appreciated, lower extremities distal to ankle are cold bilaterally. MSK: Diffuse muscle wasting with decreased bulk noticed at shoulders and thighs.Normal tone in right extremities, flaccid in left upper extremity. 5/5 grip strength, right arm flexion and extension. 0/5 strength in left extremities.  Neuro:  Mental status: Alert and oriented to person and place, able to follow commands in right extremities. Spontaneously moves right leg at rest. Reflexes: Brisk patellar and bicep reflexes bilaterally Cranial Nerves: Pupils are large, equal, round, and reactive to light. Decreased sensation in CN5 dermatome in left face. Facial asymmetry with left facial droop present. Tongue asymmetric.  Cerebellar: Unable to understand/follow commands  Assessment & Plan:  Atrial fibrillation- Noted on telemetry on 9/4, likely secondary to HFrEF and left atrial dilation. -Start PO metoprolol 25 mg BID - Continue telemetry  Thrombotic R MCA CVA secondary to M1 occlusion, Chronic bilateral ICA stenosis - s/p IR revascularization attempt but unfortunately unsuccessful.  - Stroke workup per neuro. - Continue home low-dose ASA - Goal SBP 120 - 160 per neuro. - PT/OT  - Speech therapy consult/swallow eval  - Place gastric tube to start nutrition/PO meds  Hx HTN, HL, Cardiomyopathy (Last cardiology note under Care Everywhere from 07/06/2017 with EF 30-35%, telephone encounter with [facility] shows congestive  heart failure exacerbation treated at [place] on 8/27), CAD s/p MI, ? A.fib per neuro (some discussions with son regarding possible A.fib; however, no documentation.  Not on any anticoagulation). Significant JVD noted on exam today, will restart home medications - Cleviprex PRN for goal SBP 120 - 160 per neuro. - Restart equivalent of home Diovan, hold other home antihypertensives (Norvasc, Clonidine, Labetalol). -Transition Simvastatin to high intensity statin, Rosuvastatin 20mg . - Hold diuresis today  Hx DM. - SSI. - Hold home glipizide, metaglip, januvia. - Check A1c.  Hypomagnesemia- Noted Mg 1.5 on 9/1. Resolved, Mg 1.9 today.  Best practice:  Diet: NPO Pain/Anxiety/Delirium protocol (if indicated): Propofol ggt / Fentanyl PRN. RASS goal -1. VAP protocol (if indicated): In place. DVT prophylaxis: SCDs in place GI prophylaxis: Protonix Glucose control: Continue insulin sliding scale Mobility: PT/OT Code Status: FULL Family Communication: Son - Writer. [phone] Disposition: Continue ICU care   Labs   CBC: Recent Labs  Lab 11/20/18 1518 11/20/18 1523 11/20/18 2053 11/21/18 0538 11/22/18 0500  WBC 6.5  --   --  6.2 7.9  NEUTROABS 3.0  --   --  4.6  --   HGB 12.0 13.9 10.5* 9.2* 9.4*  HCT 38.0 41.0 31.0* 29.0* 29.2*  MCV 89.4  --   --  88.7 86.6  PLT 234  --   --  224 [id number]    Basic Metabolic Panel: Recent Labs  Lab 11/20/18 1518 11/20/18 1523 11/20/18 1955 11/20/18 2053 11/21/18 0538 11/22/18 0500 11/23/18 0221  NA 140 141  --  142 140 142  --   K 3.9 3.9  --  4.4 3.9 3.7  --   CL 106 108  --   --  112* 108  --   CO2 20*  --   --   --  19* 21*  --   GLUCOSE 167* 163*  --   --  108* 136*  --   BUN 20 24*  --   --  15 16  --   CREATININE 1.06* 1.00  --   --  0.98 1.01*  --   CALCIUM 9.2  --   --   --  8.3* 8.9  --   MG  --   --  1.5*  --   --   --  1.9   GFR: Estimated Creatinine Clearance: 34.7 mL/min (A) (by C-G formula based on SCr of 1.01  mg/dL (H)). Recent Labs  Lab 11/20/18 1518 11/21/18 0538 11/22/18 0500  WBC 6.5 6.2 7.9    Liver Function Tests: Recent Labs  Lab 11/20/18 1518  AST 19  ALT 32  ALKPHOS 66  BILITOT 0.9  PROT 6.7  ALBUMIN 3.8   No results for input(s): LIPASE, AMYLASE in the last 168 hours. No results for input(s): AMMONIA in the last 168 hours.  ABG    Component Value Date/Time   PHART 7.372 11/20/2018 2053   PCO2ART 33.1 11/20/2018 2053   PO2ART 57.0 (L) 11/20/2018 2053   HCO3 19.4 (L) 11/20/2018 2053   TCO2 20 (L) 11/20/2018 2053   ACIDBASEDEF 5.0 (H) 11/20/2018 2053   O2SAT 90.0 11/20/2018 2053     Coagulation Profile: Recent Labs  Lab 11/20/18 1518  INR 1.0    Cardiac Enzymes: No results for input(s): CKTOTAL, CKMB, CKMBINDEX, TROPONINI in the last 168 hours.  HbA1C: Hgb A1c MFr Bld  Date/Time Value Ref Range Status  11/21/2018 05:38 AM 6.6 (H) 4.8 - 5.6 % Final    Comment:    (NOTE) Pre diabetes:          5.7%-6.4% Diabetes:              >6.4% Glycemic control for   <7.0% adults with diabetes   01/06/2007 03:43 PM   Final   5.9 (NOTE)   The ADA recommends the following therapeutic goals for glycemic   control related to Hgb A1C measurement:   Goal of Therapy:   < 7.0% Hgb A1C   Action Suggested:  > 8.0% Hgb A1C   Ref:  Diabetes Care, 22, Suppl. 1, 1999    CBG: Recent Labs  Lab 11/22/18 1541 11/22/18 1923 11/22/18 2318 11/23/18 0306 11/23/18 0812  GLUCAP 136* 92 141* 116* 156*    Review of Systems:   Unable to review due to patient's current status.  Past Medical History  She,  has a past medical history of Arthritis, CAD (coronary artery disease), Carotid artery occlusion, Diabetes mellitus, Hyperlipidemia, Hypertension, and Myocardial infarction ([place]).   Surgical History    Past Surgical History:  Procedure Laterality Date  . CAROTID ENDARTERECTOMY  01/06/2007   right - with Dacron patch angioplasty  . HERNIA REPAIR  2012  . IR CT HEAD LTD   11/20/2018  . IR CT HEAD LTD  11/20/2018  . IR PERCUTANEOUS ART THROMBECTOMY/INFUSION INTRACRANIAL INC DIAG ANGIO  11/20/2018  . RADIOLOGY WITH ANESTHESIA N/A 11/20/2018   Procedure: CODE STROKE;  Surgeon: Luanne Bras, MD;  Location: [facility];  Service: Radiology;  Laterality: N/A;     Social History   reports that she quit smoking about 31 years ago. Her smoking use included cigarettes. She smoked 1.00 pack per day. She has never used smokeless tobacco. She reports that she does not drink alcohol.   Family History   Her family history includes Diabetes in her brother and mother; Heart disease in her brother and mother.   Allergies No Known Allergies   Home Medications  Prior to Admission medications   Medication Sig Start Date End Date Taking? Authorizing Provider  amLODipine (NORVASC) 10 MG tablet Take 10 mg by mouth daily.      [provider]  aspirin EC 81 MG tablet Take 81 mg by mouth daily.      [provider]  cholecalciferol (VITAMIN D) 1000 UNITS tablet Take 2,000 Units by mouth daily.    [provider]  cilostazol (PLETAL) 100 MG tablet Take 100 mg by mouth daily.      [provider]  cloNIDine (CATAPRES) 0.1 MG tablet Take 0.1 mg by mouth 2 (two) times daily.      [provider]  glipiZIDE (GLUCOTROL) 5 MG tablet Take 5 mg by mouth daily. Take one half tab at noon    [provider]  glipiZIDE-metformin (METAGLIP) 5-500 MG per tablet Take 1 tablet by mouth 2 (two) times daily before a meal.      [provider]  labetalol (NORMODYNE) 200 MG tablet Take 200 mg by mouth 2 (two) times daily.      [provider]  loratadine (CLARITIN) 10 MG tablet Take 10 mg by mouth Daily. 05/28/11   [provider]  simvastatin (ZOCOR) 20 MG tablet Take 20 mg by mouth at bedtime.      [provider]  sitaGLIPtin (JANUVIA) 100 MG tablet Take 100 mg by mouth daily.      [provider]   valsartan-hydrochlorothiazide (DIOVAN-HCT) 320-12.5 MG per tablet Take 1 tablet by mouth daily.      [provider]    Leonides Schanz, MS4  11/23/2018, 9:51 AM  Critical care attending attestation note:  Patient seen and examined and relevant ancillary tests reviewed.  I agree with the assessment and plan of care as outlined by [facility]. This patient was not seen as a shared visit.  Synopsis of assessment and plan:  78 year old woman who shows evidence of a completed right MCA stroke with unsuccessful revascularization.  Extubated yesterday.  Developed rapid atrial fibrillation with partial rate control with IV metoprolol.  On examination today:  Frail woman. Remains somnolent but able to follow commands briskly on the right side.  Intermittently command in left lower extremity.  No movement on the left upper extremity.  Intermittent voice phonation. JVP is elevated with CV waves consistent with tricuspid regurgitation.  Apex beat is displaced and sustained.  First and second heart sounds are unremarkable.  No murmurs or gallops were appreciated.  Abdomen is soft and nontender. There is no peripheral edema.  Telemetry monitoring shows atrial fibrillation with generally reasonable rate control.  She has had runs up to 130 bpm.  Assessment  Status post completed a MCA infarct. Atrial fibrillation with variable rate response.  Place NG feeding tube withplan to transition to Cortrak tube Start NG feeding Resume home heart failure and antihypertensive medication Change  home labetalol to metoprolol for better rate control Resume baseline diuretic therapy Continue secondary stroke prevention Given presence of atrial fibrillation is the likely cause of her stroke and will she would benefit from long-term anticoagulation on this basis.  Kipp Brood, MD [facility] ICU Physician [place]  Pager: [phone] Mobile: [phone] After hours:  [phone].  11/23/2018, 1:08 PM

## 2018-11-23 NOTE — Evaluation (Deleted)
Speech Language Pathology Evaluation Patient Details Name: [name] MRN: [id number] DOB: 08/02/1940 Today's Date: 11/23/2018 Time: [id number] SLP Time Calculation (min) (ACUTE ONLY): 17 min  Problem List:  Patient Active Problem List   Diagnosis Date Noted  . Stroke ([place]) 11/20/2018  . Middle cerebral artery embolism, right 11/20/2018  . Heart murmur 11/20/2018  . Bilateral carotid artery stenosis 11/20/2018  . Acute respiratory failure with hypoxia ([place])   . Encephalopathy acute   . Type 2 diabetes mellitus without complication, without long-term current use of insulin ([place])   . End stage renal disease ([place]) 08/11/2011  . Occlusion and stenosis of carotid artery without mention of cerebral infarction 08/11/2011   Past Medical History:  Past Medical History:  Diagnosis Date  . Arthritis   . CAD (coronary artery disease)   . Carotid artery occlusion   . Diabetes mellitus   . Hyperlipidemia   . Hypertension   . Myocardial infarction [facility])    Past Surgical History:  Past Surgical History:  Procedure Laterality Date  . CAROTID ENDARTERECTOMY  01/06/2007   right - with Dacron patch angioplasty  . HERNIA REPAIR  2012  . IR CT HEAD LTD  11/20/2018  . IR CT HEAD LTD  11/20/2018  . IR PERCUTANEOUS ART THROMBECTOMY/INFUSION INTRACRANIAL INC DIAG ANGIO  11/20/2018  . RADIOLOGY WITH ANESTHESIA N/A 11/20/2018   Procedure: CODE STROKE;  Surgeon: Luanne Bras, MD;  Location: [place];  Service: Radiology;  Laterality: N/A;   HPI:  [name] is a 78 y.o. F with PMH of CAD and MI, HTN, HL, cardiomyopathy, Type II DM, and bilateral carotid artery stenosis s/p right carotid endarterectomy in 2008. Pt was found by her son 8/31 in bed with flaccid L side, L facial droop and slurred speech; prior level of functioning was independent. MRI revealed R middle cerebral infarct affecting the insula and frontoparietal brain, CXR normal. Pt was intubated upon arrival on 8/31, extubated 9/1,  and is currently on room air.    Assessment / Plan / Recommendation Clinical Impression  Pt presented lethargic and difficult to rouse. Her son was present and reported successful communication with her using hand squeezing for yes. OME was unable to be completed due to decreased pt alertness and attention, oral care was provided for dry mouth, labial peeling, and a lower lip ulcer. Given ice chips and puree pt was unresponive to presentation of POs and did not manipulate or swallow given max verbal and tactile cues. Suction required to remove entirety of bolus. Recommend NPO with meds through alternative means with f/u from SLP for continued trials of PO readiness and awareness.     SLP Assessment  SLP Visit Diagnosis: Dysphagia, unspecified (R13.10)    Follow Up Recommendations  Skilled Nursing facility    Frequency and Duration min 2x/week         SLP Evaluation Cognition          Comprehension       Expression     Oral / Motor  Oral Motor/Sensory Function Overall Oral Motor/Sensory Function: Severe impairment Facial ROM: Reduced left Facial Symmetry: Abnormal symmetry left                       [name] 11/23/2018, 3:28 PM

## 2018-11-23 NOTE — Progress Notes (Signed)
   11/23/18 1115  Clinical Encounter Type  Visited With Patient and family together  Visit Type Initial;Spiritual support  Referral From Chaplain  Consult/Referral To Chaplain  Spiritual Encounters  Spiritual Needs Prayer;Emotional;Grief support  Stress Factors  Family Stress Factors Health changes;Loss;Major life changes   Chaplain visited as a result of rounding on the unit.  Chaplain noticed the family at bedside looked emotionally grieved.  Chaplain visited with the patients son [name].  [name] acknowledged difficulty with the diagnosis.  [name] also shared with the chaplain the relationship he shared with his mother and how he is grieving that loss.  The chaplain sat with [name] and listened empathetically as well as provided prayer for the patient and her son.  The chaplain will follow-up as needed.  Brion Aliment Chaplain Resident For questions concerning this note please contact me by pager [phone]

## 2018-11-23 NOTE — Progress Notes (Signed)
STROKE TEAM PROGRESS NOTE   INTERVAL HISTORY Patient is neurologically unchanged.  She was extubated yesterday and seems to be breathing well.  She is now in new onset A. fib with rapid heart rate.  She is also mildly hypertensive.  Patient's son is at the bedside and I spoke to him and answered questions.  She has not yet passed swallow eval by speech therapy and will need parenteral feeds  Vitals:   11/23/18 0600 11/23/18 0700 11/23/18 0800 11/23/18 0900  BP: (!) 172/69 (!) 169/89 (!) 174/99 (!) 167/82  Pulse: (!) 109     Resp: (!) 21 15 (!) 22 20  Temp:   100.1 F (37.8 C)   TempSrc:   Oral   SpO2: 99%  100%   Weight:      Height:        CBC:  Recent Labs  Lab 11/20/18 1518  11/21/18 0538 11/22/18 0500  WBC 6.5  --  6.2 7.9  NEUTROABS 3.0  --  4.6  --   HGB 12.0   < > 9.2* 9.4*  HCT 38.0   < > 29.0* 29.2*  MCV 89.4  --  88.7 86.6  PLT 234  --  224 214   < > = values in this interval not displayed.    Basic Metabolic Panel:  Recent Labs  Lab 11/20/18 1955  11/21/18 0538 11/22/18 0500 11/23/18 0221  NA  --    < > 140 142  --   K  --    < > 3.9 3.7  --   CL  --   --  112* 108  --   CO2  --   --  19* 21*  --   GLUCOSE  --   --  108* 136*  --   BUN  --   --  15 16  --   CREATININE  --   --  0.98 1.01*  --   CALCIUM  --   --  8.3* 8.9  --   MG 1.5*  --   --   --  1.9   < > = values in this interval not displayed.   Lipid Panel:     Component Value Date/Time   CHOL 124 11/21/2018 0538   TRIG 106 11/23/2018 0221   HDL 39 (L) 11/21/2018 0538   CHOLHDL 3.2 11/21/2018 0538   VLDL 13 11/21/2018 0538   LDLCALC 72 11/21/2018 0538   HgbA1c:  Lab Results  Component Value Date   HGBA1C 6.6 (H) 11/21/2018    IMAGING Ct Head Code Stroke Wo Contrast 11/20/2018 1. Subtle hyperdense right MCA. There is occlusion of the right distal M1 on CTA today. 2. ASPECTS is 10 3. No acute infarct or hemorrhage   Ct Angio Head W Or Wo Contrast Ct Angio Neck W Or Wo  Contrast 11/20/2018 1. Occlusion right M1 segment distally with poor flow in the right M2 and M3 branches. 2. Postop right carotid endarterectomy 2008. Right carotid bifurcation widely patent 3. Severe atherosclerotic disease left carotid bifurcation with occlusion of the left internal carotid artery. 4. Left anterior and left middle cerebral arteries appear patent without significant stenosis through collateral flow through the anterior communicating artery. 5. Posterior circulation intact   Cerebral Angiogram 11/20/2018 1.occluded Rt MCA distal M1 seg. Unsuccessful revascularization Despite x 2 passes with 25mm xc 82mm embotrap retriever device and penumbra aspiration  due to underlying hard ICAD. Unsuccessful intracranial rescue stenting dut vascular tortuosity.  Mr  Brain Wo Contrast 11/21/2018 8 cm confluent region of infarction in the right middle cerebral artery territory affecting the insula and frontoparietal brain. Mild swelling but no evidence of hemorrhage at this time or of significant mass effect or any shift. Old infarctions within the right cerebellum, left occipital lobe and at the left frontoparietal vertex.   Dg Chest Port 1 View 11/20/2018 1. The endotracheal tube is borderline low. Repositioning should be considered. 2. Cardiomegaly with mild vascular congestion.  11/21/2018 Endotracheal tube in satisfactory position. No acute abnormality noted.   2D Echocardiogram  1. Akinesis of the left ventricular, entire inferior wall and inferolateral wall.  2. The left ventricle has moderately reduced systolic function, with an ejection fraction of 35-40%. The cavity size was normal. There is mild concentric left ventricular hypertrophy. Left ventricular diastolic Doppler parameters are consistent with pseudonormalization. Elevated mean left atrial pressure.  3. The right ventricle has normal systolic function. The cavity was normal. There is no increase in right ventricular wall thickness.  Right ventricular systolic pressure could not be assessed.  4. Left atrial size was mildly dilated.  5. There is mild mitral annular calcification present.  6. The aorta is normal unless otherwise noted.   PHYSICAL EXAM   Elderly African-American lady not in distress . Afebrile. Head is nontraumatic. Neck is supple without bruit.    Cardiac exam no murmur or gallop. Lungs are clear to auscultation. Distal pulses are well felt. Neurological Exam : Patient is awake and alert.  She speaks only a few words and follows only occasional midline commands..  She has right gaze deviation.  She is not able to follow gaze to the left.  She blinks to threat on the right but not on the left.  Pupils equal reactive.  Fundi not visualized.     She does move right upper and lower extremity against gravity.  She withdraws left lower extremity partially to pain.  Does not withdraw left upper extremity.  Tone is diminished on the left and normal on the right.  Right plantars downgoing left is equivocal.  ASSESSMENT/PLAN Ms. [name] is a 78 y.o. female with history of myocardial infarction, hypertension, hyperlipidemia, diabetes, carotid artery occlusion on the left, CAD and arthritis presenting with L sided neglect, R eye deviation and L side flaccid. Received tPA 11/20/2018 at 1545. CTA showed R M1 occlusion. Taken to IR.  SBP > 220.   Stroke:   R MCA insular/frontoparietal infarct s/p tPA and unsuccessful IR of R M1 occlusion - infarct due to large vessel disease vs embolic from new onset AF  Code Stroke CT head subtle hyperdense R MCA. No acute abnormality. ASPECTS 10.     CTA head & neck R M! Occlusion w/ poor flor R M2 and R M3. R CEA 2008 w/ patent bifurcation. Severe L ICA bifurcation atherosclerosis w/ occlusion L ICA. L ACA and L MCA patent from ACom. Poster circ ok.  Cerebral angio occluded R M1. Unsuccessful revascularization after 2 passes w/ embotrap and penumbra aspiration d/t underlying hard  atherosclerosis and vascular tortuosity.  Post IR CT no ICH or mass effect  MRI R MCA infarct insular and frontoparietal. Old R cerebellar, L occipital, L frontoparietal vertex infarcts.  2D Echo EF 35-40%. LA mildly dilated. No source of embolus   LDL 72  HgbA1c 6.6  Lovenox 40 mg sq daily  for VTE prophylaxis  aspirin 81 mg daily and pletal prior to admission, now on aspirin 150 mg supp daily  as NPO. No AC at this time given large stroke.  Therapy recommendations:  pending    Disposition:  pending  (lives alone, son checks on daily)  D/c foley  Acute Respiratory Failure w/ hypoxia  Intubated for IR, not extubated at the end of the procedure d/t tPA administration and sheath left in  Sheath now out  Extubated 9/2   Atrial Fibrillation w/ RVR, confirmed, new dx  Rhythm post intubation looked like AF to MD  AF not captured on EKG or tele in ED  telemetry monitoring showed AF last night   Treated with metoprolol  HR now 116-160s per RN  Add cardizem gtt as NPO  Cardiology consulted  No AC at this time given large stroke and risk of hemorrhage    Dysphagia  Secondary to stroke  NPO  Failed MBSS  Needs TF but unable to place Cortrak until Friday. Not safe for NG by RN given AF w/ RVR  Add NS at 50h  SLP on board  Carotid Disease  Hx L ICA occlusion  Hypertensive Urgency  SBP > 220 in ED, as high as 222/128  Home meds:  norvasc 10, clonidine 0.1 bid, labetalol 200 bid, valsartan 320-12.5 daily  Treated w/ cleviprex, Now off.   BP stable  Long-term BP goal normotensive  Hyperlipidemia  Home meds:  zocor 20  LDL 72, goal < 70  Resume statin once able to swallow  Continue statin at discharge  Diabetes type II Controlled  Home meds:  Glipizide 5, glipizide-metformin 5-500 bid, Januvia 100  HgbA1c 6.6, goal < 7.0  CBGs  SSI  DB RN on baord   Other Stroke Risk Factors  Advanced age  Former Cigarette smoker  Coronary  artery disease s/p MI    Other Active Problems  R DP and PT not dopplerable prior to or post IR  Acute blood loss anemia 13.9->9.2->9.4. monitor  Hospital day # 3   Continue heparin drip for A. fib.  Consider Cardizem drip for rapid A. fib.  Cardiology consult for new onset A. fib.  Patient is unable to swallow.  Consider core tract tube for tube feeds.  Discussed with son at the bedside and answered questions.  Discussed with Dr. Lynetta Mare critical care medicine. This patient is critically ill and at significant risk of neurological worsening, death and care requires constant monitoring of vital signs, hemodynamics,respiratory and cardiac monitoring, extensive review of multiple databases, frequent neurological assessment, discussion with family, other specialists and medical decision making of high complexity.I have made any additions or clarifications directly to the above note.This critical care time does not reflect procedure time, or teaching time or supervisory time of PA/NP/Med Resident etc but could involve care discussion time.  I spent 30 minutes of neurocritical care time  in the care of  this patient.       Antony Contras, MD Medical Director [place] Pager: [phone] 11/23/2018 10:53 AM   To contact Stroke Continuity provider, please refer to [email or web address] After hours, contact General Neurology

## 2018-11-23 NOTE — Evaluation (Signed)
Clinical/Bedside Swallow Evaluation Patient Details  Name: [name] MRN: [id number] Date of Birth: 24-Oct-1940  Today's Date: 11/23/2018 Time: SLP Start Time (ACUTE ONLY): [id number] SLP Stop Time (ACUTE ONLY): 1459 SLP Time Calculation (min) (ACUTE ONLY): 17 min  Past Medical History:  Past Medical History:  Diagnosis Date  . Arthritis   . CAD (coronary artery disease)   . Carotid artery occlusion   . Diabetes mellitus   . Hyperlipidemia   . Hypertension   . Myocardial infarction [facility])    Past Surgical History:  Past Surgical History:  Procedure Laterality Date  . CAROTID ENDARTERECTOMY  01/06/2007   right - with Dacron patch angioplasty  . HERNIA REPAIR  2012  . IR CT HEAD LTD  11/20/2018  . IR CT HEAD LTD  11/20/2018  . IR PERCUTANEOUS ART THROMBECTOMY/INFUSION INTRACRANIAL INC DIAG ANGIO  11/20/2018  . RADIOLOGY WITH ANESTHESIA N/A 11/20/2018   Procedure: CODE STROKE;  Surgeon: Luanne Bras, MD;  Location: [place];  Service: Radiology;  Laterality: N/A;   HPI:  [name] is a 78 y.o. F with PMH of CAD and MI, HTN, HL, cardiomyopathy, Type II DM, and bilateral carotid artery stenosis s/p right carotid endarterectomy in 2008. Pt was found by her son 8/31 in bed with flaccid L side, L facial droop and slurred speech; prior level of functioning was independent. MRI revealed R middle cerebral infarct affecting the insula and frontoparietal brain, CXR normal. Pt was intubated upon arrival on 8/31, extubated 9/1, and is currently on room air.    Assessment / Plan / Recommendation Clinical Impression  Pt presented lethargic and difficult to rouse. Her son was present and reported successful communication with her using hand squeezing for yes. OME was limited due to decreased pt alertness and attention, reduced L facial symmetry noted. Oral care was provided for dry mouth, labial peeling, and a lower lip ulcer. Given ice chips and puree pt was unresponive to presentation of POs  and did not manipulate or swallow given max verbal and tactile cues. Suction required to remove entirety of bolus. Recommend NPO with meds through alternative means with f/u from SLP for continued trials of PO readiness and awareness.  SLP Visit Diagnosis: Dysphagia, unspecified (R13.10)    Aspiration Risk  Severe aspiration risk    Diet Recommendation NPO;Ice chips PRN after oral care   Medication Administration: Via alternative means    Other  Recommendations Oral Care Recommendations: Oral care QID;Oral care prior to ice chip/H20 Other Recommendations: Have oral suction available   Follow up Recommendations Skilled Nursing facility      Frequency and Duration min 2x/week  2 weeks       Prognosis Prognosis for Safe Diet Advancement: Fair Barriers to Reach Goals: Severity of deficits      Swallow Study   General Date of Onset: 11/20/18 HPI: [name] is a 78 y.o. F with PMH of CAD and MI, HTN, HL, cardiomyopathy, Type II DM, and bilateral carotid artery stenosis s/p right carotid endarterectomy in 2008. Pt was found by her son 8/31 in bed with flaccid L side, L facial droop and slurred speech; prior level of functioning was independent. MRI revealed R middle cerebral infarct affecting the insula and frontoparietal brain, CXR normal. Pt was intubated upon arrival on 8/31, extubated 9/1, and is currently on room air.  Type of Study: Bedside Swallow Evaluation Previous Swallow Assessment: (none) Diet Prior to this Study: NPO Temperature Spikes Noted: No Respiratory Status: Room air History  of Recent Intubation: Yes Length of Intubations (days): 3 days Date extubated: 11/21/18 Behavior/Cognition: Lethargic/Drowsy;Requires cueing;Doesn't follow directions Oral Cavity Assessment: Dry;Dried secretions Oral Care Completed by SLP: Yes Oral Cavity - Dentition: Dentures, not available(No dentures top or bottom) Vision: (left neglect) Self-Feeding Abilities: Total assist Patient  Positioning: Upright in bed Baseline Vocal Quality: Low vocal intensity Volitional Cough: Cognitively unable to elicit    Oral/Motor/Sensory Function Overall Oral Motor/Sensory Function: Severe impairment Facial ROM: Reduced left Facial Symmetry: Abnormal symmetry left Facial Strength: Reduced left   Ice Chips Ice chips: Impaired Presentation: Spoon Oral Phase Impairments: Poor awareness of bolus;Reduced lingual movement/coordination;Reduced labial seal Oral Phase Functional Implications: Oral holding Pharyngeal Phase Impairments: (no swallow)   Thin Liquid Thin Liquid: Not tested    Nectar Thick Nectar Thick Liquid: Not tested   Honey Thick Honey Thick Liquid: Not tested   Puree Puree: Impaired Presentation: Spoon Oral Phase Impairments: Poor awareness of bolus;Reduced lingual movement/coordination;Reduced labial seal Oral Phase Functional Implications: Oral holding Pharyngeal Phase Impairments: (no swallow)   Solid     Solid: Not tested      [name] 11/23/2018,4:47 PM

## 2018-11-23 NOTE — Progress Notes (Signed)
Advanced NG tube per imaging recommendations, external tube length now 50cm. Tube feeds re-started.  Candy Sledge, RN

## 2018-11-23 NOTE — Progress Notes (Signed)
Attempted to see pt today. Pt with afib and HR in 160s.  Will defer evaluation and return as schedule allows.  Jinger Neighbors, [place] [id number]

## 2018-11-23 NOTE — Progress Notes (Signed)
ANTICOAGULATION CONSULT NOTE   Pharmacy Consult for heparin Indication: atrial fibrillation  No Known Allergies  Patient Measurements: Height: 5\' 1"  (154.9 cm) Weight: 102 lb 4.7 oz (46.4 kg) IBW/kg (Calculated) : 47.8 Heparin Dosing Weight: 46kg  Vital Signs: Temp: 99.5 F (37.5 C) (09/03 1200) Temp Source: Oral (09/03 1200) BP: 173/80 (09/03 1509) Pulse Rate: 102 (09/03 1509)  Labs: Recent Labs    11/20/18 1955  11/20/18 2053 11/21/18 0538 11/22/18 0500  HGB  --    < > 10.5* 9.2* 9.4*  HCT  --   --  31.0* 29.0* 29.2*  PLT  --   --   --  224 214  CREATININE  --   --   --  0.98 1.01*  TROPONINIHS 70*  --   --   --   --    < > = values in this interval not displayed.    Estimated Creatinine Clearance: 34.7 mL/min (A) (by C-G formula based on SCr of 1.01 mg/dL (H)).   Medical History: Past Medical History:  Diagnosis Date  . Arthritis   . CAD (coronary artery disease)   . Carotid artery occlusion   . Diabetes mellitus   . Hyperlipidemia   . Hypertension   . Myocardial infarction [facility])     Assessment: 78 year old female with new afib. New cva, was given tpa on 8/31. New orders to start IV heparin, will aim for low end of goal and avoid bolusing given recent stroke requiring tpa. Sq lovenox received this morning, hgb stable in 9s.   Goal of Therapy:  Heparin level 0.3-0.5 units/ml Monitor platelets by anticoagulation protocol: Yes   Plan:  Start heparin infusion at 600 units/hr Check anti-Xa level in 8 hours and daily while on heparin Continue to monitor H&H and platelets  Erin Hearing PharmD., BCPS Clinical Pharmacist 11/23/2018 4:28 PM

## 2018-11-24 DIAGNOSIS — I5023 Acute on chronic systolic (congestive) heart failure: Secondary | ICD-10-CM

## 2018-11-24 DIAGNOSIS — I480 Paroxysmal atrial fibrillation: Secondary | ICD-10-CM

## 2018-11-24 LAB — BASIC METABOLIC PANEL
Anion gap: 8 (ref 5–15)
BUN: 22 mg/dL (ref 8–23)
CO2: 22 mmol/L (ref 22–32)
Calcium: 8.7 mg/dL — ABNORMAL LOW (ref 8.9–10.3)
Chloride: 115 mmol/L — ABNORMAL HIGH (ref 98–111)
Creatinine, Ser: 1.01 mg/dL — ABNORMAL HIGH (ref 0.44–1.00)
GFR calc Af Amer: >60 mL/min (ref >60)
GFR calc non Af Amer: 54 mL/min — ABNORMAL LOW (ref >60)
Glucose, Bld: 149 mg/dL — ABNORMAL HIGH (ref 70–99)
Potassium: 3.6 mmol/L (ref 3.5–5.1)
Sodium: 145 mmol/L (ref 135–145)

## 2018-11-24 LAB — MAGNESIUM
Magnesium: 2.1 mg/dL (ref 1.7–2.4)
Magnesium: 1.9 mg/dL (ref 1.7–2.4)

## 2018-11-24 LAB — GLUCOSE, CAPILLARY
Glucose-Capillary: 133 mg/dL — ABNORMAL HIGH (ref 70–99)
Glucose-Capillary: 146 mg/dL — ABNORMAL HIGH (ref 70–99)
Glucose-Capillary: 155 mg/dL — ABNORMAL HIGH (ref 70–99)
Glucose-Capillary: 160 mg/dL — ABNORMAL HIGH (ref 70–99)
Glucose-Capillary: 184 mg/dL — ABNORMAL HIGH (ref 70–99)
Glucose-Capillary: 208 mg/dL — ABNORMAL HIGH (ref 70–99)

## 2018-11-24 LAB — CBC
HCT: 30.8 % — ABNORMAL LOW (ref 36.0–46.0)
Hemoglobin: 9.9 g/dL — ABNORMAL LOW (ref 12.0–15.0)
MCH: 28.3 pg (ref 26.0–34.0)
MCHC: 32.1 g/dL (ref 30.0–36.0)
MCV: 88 fL (ref 80.0–100.0)
Platelets: 218 10*3/uL (ref 150–400)
RBC: 3.5 MIL/uL — ABNORMAL LOW (ref 3.87–5.11)
RDW: 17 % — ABNORMAL HIGH (ref 11.5–15.5)
WBC: 8.8 10*3/uL (ref 4.0–10.5)
nRBC: 0 % (ref 0.0–0.2)

## 2018-11-24 LAB — PHOSPHORUS
Phosphorus: 2.7 mg/dL (ref 2.5–4.6)
Phosphorus: 2.2 mg/dL — ABNORMAL LOW (ref 2.5–4.6)

## 2018-11-24 LAB — HEPARIN LEVEL (UNFRACTIONATED)
Heparin Unfractionated: 0.43 IU/mL (ref 0.30–0.70)
Heparin Unfractionated: 0.19 IU/mL — ABNORMAL LOW (ref 0.30–0.70)

## 2018-11-24 MED ORDER — METOPROLOL TARTRATE 5 MG/5ML IV SOLN
5.0000 mg | Freq: Four times a day (QID) | INTRAVENOUS | Status: DC
  Administered 2018-11-24 – 2018-11-25 (×4): 5 mg via INTRAVENOUS
  Filled 2018-11-24 (×4): qty 5

## 2018-11-24 MED ORDER — ASPIRIN 81 MG PO CHEW
81.0000 mg | CHEWABLE_TABLET | Freq: Every day | ORAL | Status: DC
  Administered 2018-11-24 – 2018-12-02 (×9): 81 mg
  Filled 2018-11-24 (×9): qty 1

## 2018-11-24 MED ORDER — DILTIAZEM HCL 30 MG PO TABS
30.0000 mg | ORAL_TABLET | Freq: Three times a day (TID) | ORAL | Status: DC
  Administered 2018-11-24 – 2018-11-28 (×12): 30 mg
  Filled 2018-11-24 (×12): qty 1

## 2018-11-24 NOTE — Evaluation (Signed)
Occupational Therapy Evaluation Patient Details Name: [name] MRN: [id number] DOB: 07/06/1940 Today's Date: 11/24/2018    History of Present Illness 78 yo female admitted with R cerebral infarct frontoparietal infarect with failed attempt to revascularization and extubated 9/2 PMH:hx of CAD, MI 20 years ago [facility]?), cardiomyopathy with EF of 30-35% by echo, bilateral carotid artery stenosis with left ICA occlsuion and right carotid endarterectomy (2008), CVA (?2015), HTN, HLD, DM, and mitral valve insufficiency     Clinical Impression   PT admitted with R cerebral infarct and frontoparietal infarct with failed revascularization. Pt currently with functional limitiations due to the deficits listed below (see OT problem list). Pt currently NPO and total +2 max (A) for bed mobility. Pt requires (A) to static sit with R lateral lean.  Pt will benefit from skilled OT to increase their independence and safety with adls and balance to allow discharge SNF.     Follow Up Recommendations  SNF    Equipment Recommendations  3 in 1 bedside commode;Wheelchair (measurements OT);Wheelchair cushion (measurements OT);Hospital bed    Recommendations for Other Services       Precautions / Restrictions Precautions Precautions: Fall      Mobility Bed Mobility Overal bed mobility: Needs Assistance Bed Mobility: Supine to Sit;Rolling Rolling: Max assist   Supine to sit: +2 for physical assistance;Max assist;HOB elevated     General bed mobility comments: pt able to push R LE off EOB and move R UE toward EOB. pt requires (A) to elevate trunk and (A) to maintain static sitting. pt sitting with C curved spine to R with right neck rotation and eye gaze  Transfers Overall transfer level: Needs assistance   Transfers: Sit to/from Stand Sit to Stand: +2 physical assistance;Max assist         General transfer comment: requires (A) To elevate and block LLE     Balance Overall balance  assessment: Needs assistance Sitting-balance support: Single extremity supported;Feet supported Sitting balance-Leahy Scale: Poor Sitting balance - Comments: strong R lean   Standing balance support: No upper extremity supported Standing balance-Leahy Scale: Zero                             ADL either performed or assessed with clinical judgement   ADL Overall ADL's : Needs assistance/impaired Eating/Feeding: NPO Eating/Feeding Details (indicate cue type and reason): NG tube in place Grooming: Total assistance   Upper Body Bathing: Total assistance   Lower Body Bathing: Total assistance   Upper Body Dressing : Total assistance   Lower Body Dressing: Total assistance   Toilet Transfer: Total assistance             General ADL Comments: total (A) for all adls at this time. pt does follow command to show two fingers, pt reports 1 digit when asked if she sees double. pt requires repositioning to help with arousal. Son grant reports she has had a full day of people visiting. pt states to son on therapist arrival "they wont stop bothering me"      Vision   Vision Assessment?: Yes Eye Alignment: Impaired (comment) Ocular Range of Motion: Impaired-to be further tested in functional context Alignment/Gaze Preference: Gaze right Tracking/Visual Pursuits: Unable to hold eye position out of midline Additional Comments: difficutlt o fully assess but using functional task and tracking "Alver Fisher" she is able to come to midline twice and R eye is lagging behind and not reaching full midlines  Perception     Praxis      Pertinent Vitals/Pain Pain Assessment: No/denies pain Faces Pain Scale: Hurts a little bit Pain Intervention(s): Monitored during session;Repositioned     Hand Dominance Right   Extremity/Trunk Assessment Upper Extremity Assessment Upper Extremity Assessment: LUE deficits/detail LUE Deficits / Details: flaccid LUE Sensation: decreased light  touch;decreased proprioception LUE Coordination: decreased fine motor;decreased gross motor   Lower Extremity Assessment Lower Extremity Assessment: Defer to PT evaluation   Cervical / Trunk Assessment Cervical / Trunk Assessment: Normal   Communication Communication Communication: Expressive difficulties   Cognition Arousal/Alertness: Lethargic Behavior During Therapy: Flat affect Overall Cognitive Status: Difficult to assess                                 General Comments: smiling when talking about "her baby" "Mariah" - her name is Nicola Girt but son Grants she has never said it right   General Comments  VSS/ BP stable    Exercises     Shoulder Instructions      Home Living Family/patient expects to be discharged to:: Skilled nursing facility                                        Prior Functioning/Environment Level of Independence: Independent        Comments: cared for 61 yo child - was in care of the child the day of event         OT Problem List: Decreased strength;Decreased range of motion;Decreased activity tolerance;Impaired balance (sitting and/or standing);Decreased safety awareness;Decreased knowledge of use of DME or AE;Decreased knowledge of precautions;Pain;Impaired sensation;Decreased cognition;Impaired UE functional use;Impaired vision/perception      OT Treatment/Interventions: Self-care/ADL training;Therapeutic exercise;Neuromuscular education;Energy conservation;DME and/or AE instruction;Manual therapy;Modalities;Therapeutic activities;Patient/family education;Balance training;Cognitive remediation/compensation;Visual/perceptual remediation/compensation    OT Goals(Current goals can be found in the care plan section) Acute Rehab OT Goals Patient Stated Goal: to see Mariah OT Goal Formulation: With patient Time For Goal Achievement: 12/08/18 Potential to Achieve Goals: Good  OT Frequency: Min 2X/week   Barriers to D/C:             Co-evaluation PT/OT/SLP Co-Evaluation/Treatment: Yes Reason for Co-Treatment: Complexity of the patient's impairments (multi-system involvement);Necessary to address cognition/behavior during functional activity;For patient/therapist safety;To address functional/ADL transfers   OT goals addressed during session: ADL's and self-care;Proper use of Adaptive equipment and DME;Strengthening/ROM      AM-PAC OT "6 Clicks" Daily Activity     Outcome Measure Help from another person eating meals?: Total Help from another person taking care of personal grooming?: Total Help from another person toileting, which includes using toliet, bedpan, or urinal?: Total Help from another person bathing (including washing, rinsing, drying)?: Total Help from another person to put on and taking off regular upper body clothing?: Total Help from another person to put on and taking off regular lower body clothing?: Total 6 Click Score: 6   End of Session Equipment Utilized During Treatment: Gait belt Nurse Communication: Mobility status;Precautions  Activity Tolerance: Patient tolerated treatment well Patient left: in bed;with call bell/phone within reach;with bed alarm set;with family/visitor present;with restraints reapplied;with SCD's reapplied  OT Visit Diagnosis: Unsteadiness on feet (R26.81);Muscle weakness (generalized) (M62.81)                Time: [id number] OT Time Calculation (min): 21 min Charges:  OT General Charges $OT Visit: 1 Visit OT Evaluation $OT Eval Moderate Complexity: 1 Mod   Jeri Modena, OTR/L  Acute Rehabilitation Services Pager: [phone] Office: [phone] .   Jeri Modena 11/24/2018, 1:35 PM

## 2018-11-24 NOTE — Progress Notes (Signed)
ANTICOAGULATION CONSULT NOTE - Follow Up Consult  Pharmacy Consult for heparin Indication: atrial fibrillation in setting of CVA  Labs: Recent Labs    11/21/18 0538 11/22/18 0500 11/23/18 2019 11/24/18 0159  HGB 9.2* 9.4*  --  9.9*  HCT 29.0* 29.2*  --  30.8*  PLT 224 214  --  218  HEPARINUNFRC  --   --   --  0.43  CREATININE 0.98 1.01* 1.03* 1.01*    Assessment/Plan:  78yo female therapeutic on heparin with initial dosing for Afib. Will continue gtt at current rate and confirm stable with additional level.   Wynona Neat, PharmD, BCPS  11/24/2018,4:06 AM

## 2018-11-24 NOTE — Procedures (Signed)
Cortrak  Person Inserting Tube:  [name], RD Tube Type:  Cortrak - 43 inches Tube Location:  Right nare Initial Placement:  Stomach Secured by: Bridle Technique Used to Measure Tube Placement:  Documented cm marking at nare/ corner of mouth Cortrak Secured At:  63 cm   No x-ray is required. RN may begin using tube.    If the tube becomes dislodged please keep the tube and contact the Cortrak team at [email or web address] ([email or web address]) for replacement.  If after hours and replacement cannot be delayed, place a NG tube and confirm placement with an abdominal x-ray.    [name] RD, LDN Clinical Nutrition Pager # [phone]

## 2018-11-24 NOTE — Progress Notes (Signed)
STROKE TEAM PROGRESS NOTE   INTERVAL HISTORY Patient is neurologically unchanged.  She was extubated 11/22/18 and seems to be breathing well.  She remains in  A. fib with rapid heart rate on cardizem drip.  Patient's son is at the bedside and I spoke to him and answered questions.  She has been started on cortrack tube parenteral feeds  Vitals:   11/24/18 1100 11/24/18 1200 11/24/18 1300 11/24/18 1400  BP: (!) 158/71 (!) 149/62 (!) 152/63 (!) 146/81  Pulse: 87 83 81 88  Resp: 15 19 19 20   Temp:  99.4 F (37.4 C)    TempSrc:  Oral    SpO2: 100% 100% 100% 100%  Weight:      Height:        CBC:  Recent Labs  Lab 11/20/18 1518  11/21/18 0538 11/22/18 0500 11/24/18 0159  WBC 6.5  --  6.2 7.9 8.8  NEUTROABS 3.0  --  4.6  --   --   HGB 12.0   < > 9.2* 9.4* 9.9*  HCT 38.0   < > 29.0* 29.2* 30.8*  MCV 89.4  --  88.7 86.6 88.0  PLT 234  --  224 214 218   < > = values in this interval not displayed.    Basic Metabolic Panel:  Recent Labs  Lab 11/23/18 1628 11/23/18 2019 11/24/18 0159  NA  --  146* 145  K  --  3.4* 3.6  CL  --  111 115*  CO2  --  20* 22  GLUCOSE  --  132* 149*  BUN  --  22 22  CREATININE  --  1.03* 1.01*  CALCIUM  --  8.6* 8.7*  MG 2.0  --  2.1  PHOS 3.6  --  2.7   Lipid Panel:     Component Value Date/Time   CHOL 124 11/21/2018 0538   TRIG 106 11/23/2018 0221   HDL 39 (L) 11/21/2018 0538   CHOLHDL 3.2 11/21/2018 0538   VLDL 13 11/21/2018 0538   LDLCALC 72 11/21/2018 0538   HgbA1c:  Lab Results  Component Value Date   HGBA1C 6.6 (H) 11/21/2018    IMAGING Ct Head Code Stroke Wo Contrast 11/20/2018 1. Subtle hyperdense right MCA. There is occlusion of the right distal M1 on CTA today. 2. ASPECTS is 10 3. No acute infarct or hemorrhage   Ct Angio Head W Or Wo Contrast Ct Angio Neck W Or Wo Contrast 11/20/2018 1. Occlusion right M1 segment distally with poor flow in the right M2 and M3 branches. 2. Postop right carotid endarterectomy 2008. Right  carotid bifurcation widely patent 3. Severe atherosclerotic disease left carotid bifurcation with occlusion of the left internal carotid artery. 4. Left anterior and left middle cerebral arteries appear patent without significant stenosis through collateral flow through the anterior communicating artery. 5. Posterior circulation intact   Cerebral Angiogram 11/20/2018 1.occluded Rt MCA distal M1 seg. Unsuccessful revascularization Despite x 2 passes with 49mm xc 42mm embotrap retriever device and penumbra aspiration  due to underlying hard ICAD. Unsuccessful intracranial rescue stenting dut vascular tortuosity.  Mr Brain Wo Contrast 11/21/2018 8 cm confluent region of infarction in the right middle cerebral artery territory affecting the insula and frontoparietal brain. Mild swelling but no evidence of hemorrhage at this time or of significant mass effect or any shift. Old infarctions within the right cerebellum, left occipital lobe and at the left frontoparietal vertex.   Dg Chest Port 1 View 11/20/2018 1. The endotracheal  tube is borderline low. Repositioning should be considered. 2. Cardiomegaly with mild vascular congestion.  11/21/2018 Endotracheal tube in satisfactory position. No acute abnormality noted.   2D Echocardiogram  1. Akinesis of the left ventricular, entire inferior wall and inferolateral wall.  2. The left ventricle has moderately reduced systolic function, with an ejection fraction of 35-40%. The cavity size was normal. There is mild concentric left ventricular hypertrophy. Left ventricular diastolic Doppler parameters are consistent with pseudonormalization. Elevated mean left atrial pressure.  3. The right ventricle has normal systolic function. The cavity was normal. There is no increase in right ventricular wall thickness. Right ventricular systolic pressure could not be assessed.  4. Left atrial size was mildly dilated.  5. There is mild mitral annular calcification present.   6. The aorta is normal unless otherwise noted.   PHYSICAL EXAM   Elderly African-American lady not in distress . Afebrile. Head is nontraumatic. Neck is supple without bruit.    Cardiac exam no murmur or gallop. Lungs are clear to auscultation. Distal pulses are well felt. Neurological Exam : Patient is awake and alert.  She speaks only a few words and follows only occasional midline commands..  She has right gaze deviation.  She is not able to follow gaze to the left.  She blinks to threat on the right but not on the left.  Pupils equal reactive.  Fundi not visualized.     She does move right upper and lower extremity against gravity.  She withdraws left lower extremity partially to pain.  Does not withdraw left upper extremity.  Tone is diminished on the left and normal on the right.  Right plantars downgoing left is equivocal.  ASSESSMENT/PLAN Ms. CORINNA MARBRY is a 78 y.o. female with history of myocardial infarction, hypertension, hyperlipidemia, diabetes, carotid artery occlusion on the left, CAD and arthritis presenting with L sided neglect, R eye deviation and L side flaccid. Received tPA 11/20/2018 at 1545. CTA showed R M1 occlusion. Taken to IR.  SBP > 220.   Stroke:   R MCA insular/frontoparietal infarct s/p tPA and unsuccessful IR of R M1 occlusion - infarct due to large vessel disease vs embolic from new onset AF  Code Stroke CT head subtle hyperdense R MCA. No acute abnormality. ASPECTS 10.     CTA head & neck R M! Occlusion w/ poor flor R M2 and R M3. R CEA 2008 w/ patent bifurcation. Severe L ICA bifurcation atherosclerosis w/ occlusion L ICA. L ACA and L MCA patent from ACom. Poster circ ok.  Cerebral angio occluded R M1. Unsuccessful revascularization after 2 passes w/ embotrap and penumbra aspiration d/t underlying hard atherosclerosis and vascular tortuosity.  Post IR CT no ICH or mass effect  MRI R MCA infarct insular and frontoparietal. Old R cerebellar, L occipital, L  frontoparietal vertex infarcts.  2D Echo EF 35-40%. LA mildly dilated. No source of embolus   LDL 72  HgbA1c 6.6  Lovenox 40 mg sq daily  for VTE prophylaxis  aspirin 81 mg daily and pletal prior to admission, now on aspirin 150 mg supp daily as NPO. No AC at this time given large stroke.  Therapy recommendations:  pending    Disposition:  pending  (lives alone, son checks on daily)  D/c foley  Acute Respiratory Failure w/ hypoxia  Intubated for IR, not extubated at the end of the procedure d/t tPA administration and sheath left in  Sheath now out  Extubated 9/2   Atrial Fibrillation  w/ RVR, confirmed, new dx  Rhythm post intubation looked like AF to MD  AF not captured on EKG or tele in ED  telemetry monitoring showed AF last night   Treated with metoprolol  HR now 116-160s per RN  Add cardizem gtt as NPO  Cardiology consulted  No AC at this time given large stroke and risk of hemorrhage    Dysphagia  Secondary to stroke  NPO  Failed MBSS  Needs TF but unable to place Cortrak until Friday. Not safe for NG by RN given AF w/ RVR  Add NS at 50h  SLP on board  Carotid Disease  Hx L ICA occlusion  Hypertensive Urgency  SBP > 220 in ED, as high as 222/128  Home meds:  norvasc 10, clonidine 0.1 bid, labetalol 200 bid, valsartan 320-12.5 daily  Treated w/ cleviprex, Now off.   BP stable  Long-term BP goal normotensive  Hyperlipidemia  Home meds:  zocor 20  LDL 72, goal < 70  Resume statin once able to swallow  Continue statin at discharge  Diabetes type II Controlled  Home meds:  Glipizide 5, glipizide-metformin 5-500 bid, Januvia 100  HgbA1c 6.6, goal < 7.0  CBGs  SSI  DB RN on baord   Other Stroke Risk Factors  Advanced age  Former Cigarette smoker  Coronary artery disease s/p MI    Other Active Problems  R DP and PT not dopplerable prior to or post IR  Acute blood loss anemia 13.9->9.2->9.4.  monitor  Hospital day # 4   Continue heparin drip for A. fib.  Continue Cardizem drip for rapid A. fib.  Cardiology help appreciated. Continue meds and feeds via cortrack tube. Add metoprolol and cardizem via cortrack tube and wean cardizem drip as tolerated. Discussed with son at the bedside and answered questions.  This patient is critically ill and at significant risk of neurological worsening, death and care requires constant monitoring of vital signs, hemodynamics,respiratory and cardiac monitoring, extensive review of multiple databases, frequent neurological assessment, discussion with family, other specialists and medical decision making of high complexity.I have made any additions or clarifications directly to the above note.This critical care time does not reflect procedure time, or teaching time or supervisory time of PA/NP/Med Resident etc but could involve care discussion time.  I spent 30 minutes of neurocritical care time  in the care of  this patient.       Antony Contras, MD Medical Director [place] Pager: [phone] 11/24/2018 3:42 PM   To contact Stroke Continuity provider, please refer to [email or web address] After hours, contact General Neurology

## 2018-11-24 NOTE — Progress Notes (Signed)
Nothing for PCCM to add at this point. Patient barely responsive on exam.  Would transition to comfort if further deterioration.  PCCM signing off  Rush Farmer, M.D. [facility] Pulmonary/Critical Care Medicine. Pager: [phone]. After hours pager: [phone].

## 2018-11-24 NOTE — Progress Notes (Signed)
[place] for heparin Indication: atrial fibrillation  No Known Allergies  Patient Measurements: Height: 5\' 1"  (154.9 cm) Weight: 95 lb 10.9 oz (43.4 kg) IBW/kg (Calculated) : 47.8 Heparin Dosing Weight: 46kg  Vital Signs: Temp: 99.4 F (37.4 C) (09/04 1200) Temp Source: Oral (09/04 1200) BP: 143/87 (09/04 0700) Pulse Rate: 101 (09/04 0700)  Labs: Recent Labs    11/22/18 0500 11/23/18 2019 11/24/18 0159 11/24/18 1248  HGB 9.4*  --  9.9*  --   HCT 29.2*  --  30.8*  --   PLT 214  --  218  --   HEPARINUNFRC  --   --  0.43 0.19*  CREATININE 1.01* 1.03* 1.01*  --     Estimated Creatinine Clearance: 32.5 mL/min (A) (by C-G formula based on SCr of 1.01 mg/dL (H)).   Medical History: Past Medical History:  Diagnosis Date  . Arthritis   . CAD (coronary artery disease)   . Carotid artery occlusion   . Diabetes mellitus   . Hyperlipidemia   . Hypertension   . Myocardial infarction [facility])     Assessment: 78 year old female with new afib. New cva, was given tpa on 8/31. New orders to start IV heparin, will aim for low end of goal and avoid bolusing given recent stroke requiring tpa. Sq lovenox received this morning, hgb stable in 9s.   Heparin level came back subtherapeutic at 0.19.  No evidence of bleeding.  Goal of Therapy:  Heparin level 0.3-0.5 units/ml Monitor platelets by anticoagulation protocol: Yes   Plan:  Increase heparin infusion to 650 units/hr Check heparin Xa level 8 hours Check Heparin level and CBC qam   Alanda Slim, PharmD, [facility] Clinical Pharmacist Please see AMION for all Pharmacists' Contact Phone Numbers 11/24/2018, 1:55 PM

## 2018-11-24 NOTE — Progress Notes (Signed)
Found NG tube in patient's bed, patient was in mittens but both rmvd by patient. Tube feeds held for now, plan for patient to receive Cortrak today per MD notes.   Candy Sledge, RN

## 2018-11-24 NOTE — Progress Notes (Signed)
Progress Note  Patient Name: [name] Date of Encounter: 11/24/2018  Primary Cardiologist: New patient  Subjective   Non-verbal, seems NAD, failed swallow study, NG tube out.  Inpatient Medications    Scheduled Meds: .  stroke: mapping our early stages of recovery book   Does not apply Once  . aspirin  150 mg Rectal Daily  . chlorhexidine  15 mL Mouth Rinse BID  . Chlorhexidine Gluconate Cloth  6 each Topical Daily  . hydrochlorothiazide  12.5 mg Oral Daily  . insulin aspart  1-3 Units Subcutaneous Q4H  . irbesartan  300 mg Per Tube Daily  . mouth rinse  15 mL Mouth Rinse q12n4p  . metoprolol tartrate  25 mg Per Tube BID  . pantoprazole (PROTONIX) IV  40 mg Intravenous QHS  . sennosides  5 mL Per Tube QHS  . sodium chloride flush  3 mL Intravenous Once   Continuous Infusions: . sodium chloride    . sodium chloride 50 mL/hr at 11/24/18 0700  . diltiazem (CARDIZEM) infusion 12.5 mg/hr (11/24/18 0700)  . feeding supplement (OSMOLITE 1.2 CAL) Stopped (11/24/18 0530)  . heparin 600 Units/hr (11/24/18 0700)   PRN Meds: acetaminophen **OR** acetaminophen (TYLENOL) oral liquid 160 mg/5 mL **OR** acetaminophen, fentaNYL (SUBLIMAZE) injection, fentaNYL (SUBLIMAZE) injection, senna-docusate   Vital Signs    Vitals:   11/24/18 0500 11/24/18 0600 11/24/18 0700 11/24/18 0800  BP: (!) 155/63 (!) 159/70 (!) 143/87   Pulse: 85 90 (!) 101   Resp: (!) 29 13 14    Temp:    99.2 F (37.3 C)  TempSrc:    Oral  SpO2: 100% 100% 100%   Weight: 43.4 kg     Height:        Intake/Output Summary (Last 24 hours) at 11/24/2018 0955 Last data filed at 11/24/2018 0700 Gross per 24 hour  Intake 1876.71 ml  Output 750 ml  Net 1126.71 ml   Last 3 Weights 11/24/2018 11/20/2018 08/11/2011  Weight (lbs) 95 lb 10.9 oz 102 lb 4.7 oz 121 lb 3.2 oz  Weight (kg) 43.4 kg 46.4 kg 54.976 kg      Telemetry    ST, episodes of atrial tachycardia, frequent PVCs, cuplets, nsVTs up to 4 beats -  Personally Reviewed  ECG    No new tracing - Personally Reviewed  Physical Exam  Left sided hemiplegia, nonverbal GEN: No acute distress.   Neck: No JVD Cardiac: RRR, no murmurs, rubs, or gallops.  Respiratory: Clear to auscultation bilaterally. GI: Soft, nontender, non-distended  MS: No edema; No deformity.  Labs    High Sensitivity Troponin:   Recent Labs  Lab 11/20/18 1955  TROPONINIHS 70*      Chemistry Recent Labs  Lab 11/20/18 1518  11/22/18 0500 11/23/18 2019 11/24/18 0159  NA 140   < > 142 146* 145  K 3.9   < > 3.7 3.4* 3.6  CL 106   < > 108 111 115*  CO2 20*   < > 21* 20* 22  GLUCOSE 167*   < > 136* 132* 149*  BUN 20   < > 16 22 22   CREATININE 1.06*   < > 1.01* 1.03* 1.01*  CALCIUM 9.2   < > 8.9 8.6* 8.7*  PROT 6.7  --   --   --   --   ALBUMIN 3.8  --   --   --   --   AST 19  --   --   --   --  ALT 32  --   --   --   --   ALKPHOS 66  --   --   --   --   BILITOT 0.9  --   --   --   --   GFRNONAA 51*   < > 54* 53* 54*  GFRAA 59*   < > >60 >60 >60  ANIONGAP 14   < > 13 15 8    < > = values in this interval not displayed.     Hematology Recent Labs  Lab 11/21/18 0538 11/22/18 0500 11/24/18 0159  WBC 6.2 7.9 8.8  RBC 3.27* 3.37* 3.50*  HGB 9.2* 9.4* 9.9*  HCT 29.0* 29.2* 30.8*  MCV 88.7 86.6 88.0  MCH 28.1 27.9 28.3  MCHC 31.7 32.2 32.1  RDW 17.1* 16.8* 17.0*  PLT 224 214 218    BNPNo results for input(s): BNP, PROBNP in the last 168 hours.   DDimer No results for input(s): DDIMER in the last 168 hours.   Radiology    Dg Abd 1 View  Result Date: 11/23/2018 CLINICAL DATA:  Nasogastric tube EXAM: ABDOMEN - 1 VIEW COMPARISON:  01/01/2015 CT 05/05/2018 FINDINGS: Esophageal tube tip in the left upper quadrant, side-port at the distal esophagus. Nonobstructed gas pattern.extensive calcifications in the mid and left upper quadrant presumably representing pancreatic calcifications as may be seen with chronic pancreatitis. IMPRESSION: 1.  Esophageal tube tip in the left upper quadrant, side-port at the distal esophagus, suggest further advancement for more optimal positioning 2. Extensive calcifications in the central and left upper quadrant felt to correspond to pancreatic calcifications/chronic pancreatitis 3. Nonobstructed gas pattern Electronically Signed   By: Donavan Foil M.D.   On: 11/23/2018 19:57    Cardiac Studies   TTE: 11/21/2018  1. Akinesis of the left ventricular, entire inferior wall and inferolateral wall.  2. The left ventricle has moderately reduced systolic function, with an ejection fraction of 35-40%. The cavity size was normal. There is mild concentric left ventricular hypertrophy. Left ventricular diastolic Doppler parameters are consistent with  pseudonormalization. Elevated mean left atrial pressure.  3. The right ventricle has normal systolic function. The cavity was normal. There is no increase in right ventricular wall thickness. Right ventricular systolic pressure could not be assessed.  4. Left atrial size was mildly dilated.  5. There is mild mitral annular calcification present.  6. The aorta is normal unless otherwise noted.    Patient Profile     78 y.o. female  with a hx of CAD, MI 20 years ago [facility]?), cardiomyopathy with EF of 30-35% by echo, bilateral carotid artery stenosis with left ICA occlsuion and right carotid endarterectomy (2008), CVA (?2015), HTN, HLD, DM, and mitral valve insufficiency  who is being seen today for the evaluation of atrial fibrillation.  She saw vascular surgery in 2018 for carotid artery stenosis. She has complete occlusion of her left ICA and 40-59% stenosis in the R ICA. No intervention at that time. 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 Notes reveal an echocardiogram with an EF of 30%, unknown if this is ischemic in nature.  Per son she was on anticoagulation for a while but does not know when it was discontinued.  He states that she  has been fully active taking care of her grand daughter, driving, cooking and full independent until Monday when he found her down laying in her bed, she presented to [facility] with left-sided neglect and eye deviation  to the right. CT head with R M1 occlusion and family decided to proceed with thrombectomy/TPA. She was hypertensive and tachycardic. She was intubated prior to the procedure. Unfortunately, revascularization was unsuccessful.   Assessment & Plan    1. Atrial fibrillation RVR - new diagnosis - NPO status until Coretrack or NG tube can be placed - cardizem drip running at 15 mg/hr, she is hypertensive and tachycardic, now in sinus tachycardia with frequent PVCs, couplets and nsVTs 3-4 beats, I would increase cardizem to 20 mg/hr - I would add metoprolol 5 mg Q6H - start PO carvedilol once she passes swallow study or NG tube is reintroduced - This patients CHA2DS2-VASc Score and unadjusted Ischemic Stroke Rate (% per year) is equal to 12.2 % stroke rate/year from a score of 9 (CHF, HTN, DM, MI, age2, stroke2, female) - she would benefit from anticoagulation when safe to initiate, per neurology - continue heparin drip, switch to DOAC once able to take oral meds - Mg 2.1 today - TSH 0.9  2. Hypertension - systolic BP > [id number] in ER - has been [id number] systolic today - permissive HTN < 180/105 - per neurology - meds as above - add ARB once able to take PO meds   3. CVA - per neuro - permissive HTN  4. Dysphagia - NPO  - speech eval, awaiting coretrack - nursing to place NG tube for access  5. Chronic systolic heart failure - history of MI, although details are not well-understood - EF this admission 30-35%, which appears stable from prior echo in [place] - unclear etiology - home regimen included:  -- 20 mg lasix every other day -- clonidine 0.1mg  at bedtime -- labetalol 400 mg BID -- entresto 49-51 mg BID - does not appear volume overloaded - caution with IVF   6. Hyperlipidemia - continue zocor - 11/21/2018: Cholesterol 124; HDL 39; LDL Cholesterol 72; VLDL 13 11/23/2018: Triglycerides 106  7. CAD - remote MI - unclear intervention - given Afib and cardiomyopathy, may benefit from a lexiscan myoview at some point  For questions or updates, please contact [place] Please consult [email or web address] for contact info under     Signed, Ena Dawley, MD  11/24/2018, 9:55 AM

## 2018-11-24 NOTE — Progress Notes (Signed)
  Speech Language Pathology Treatment: Dysphagia  Patient Details Name: [name] MRN: [id number] DOB: 1941/02/03 Today's Date: 11/24/2018 Time: [id number] SLP Time Calculation (min) (ACUTE ONLY): 20 min  Assessment / Plan / Recommendation Clinical Impression  Clinical presentation relatively unchanged from yesterday. Keeps eyes closed, intermittently verbally responsive to questions. Mostly responds "uh huh" to yes/no questions, spontaneous phrases x 2 with 1-2 intelligible words. Mouth breathes with significant left side facial asymmetry and lingual protrusion. Oral care provided and no attempts to move ice chip or bite pudding after reasonable time dry spoon presentations. Pudding leaking from left side and material suctioned from oral cavity. Given her presentation past 2 days prognosis for swallow return with treatment is questionable. Continue oral care and can attempt ice chip with nursing if she requests. Son present and reported pt asking for his drink on table this am. Will continue efforts however if no progress would suspect her care may move towards comfort.     HPI HPI: [name] is a 78 y.o. F with PMH of CAD and MI, HTN, HL, cardiomyopathy, Type II DM, and bilateral carotid artery stenosis s/p right carotid endarterectomy in 2008. Pt was found by her son 8/31 in bed with flaccid L side, L facial droop and slurred speech; prior level of functioning was independent. MRI revealed R middle cerebral infarct affecting the insula and frontoparietal brain, CXR normal. Pt was intubated upon arrival on 8/31, extubated 9/1, and is currently on room air.       SLP Plan  Continue with current plan of care       Recommendations  Diet recommendations: NPO Medication Administration: Via alternative means                Oral Care Recommendations: Oral care QID Follow up Recommendations: Skilled Nursing facility SLP Visit Diagnosis: Dysphagia, unspecified (R13.10) Plan:  Continue with current plan of care       GO                [name] 11/24/2018, 1:16 PM

## 2018-11-24 NOTE — Evaluation (Signed)
Physical Therapy Evaluation Patient Details Name: [name] MRN: [id number] DOB: May 06, 1940 Today's Date: 11/24/2018   History of Present Illness  78 yo female admitted with R cerebral infarct frontoparietal infarect with failed attempt to revascularization and extubated 9/2 PMH:hx of CAD, MI 20 years ago [name]?), cardiomyopathy with EF of 30-35% by echo, bilateral carotid artery stenosis with left ICA occlsuion and right carotid endarterectomy (2008), CVA (?2015), HTN, HLD, DM, and mitral valve insufficiency    Clinical Impression  Patient presents with left hemiparesis, impaired balance, lethargy, right gaze preference, and impaired mobility s/p above. Pt independent PTA. Today, pt requires Max A for bed mobility and static sitting balance. Lethargic initially but this improved with sitting upright and when talking about her grand baby. Requires Max A of 2 for standing with therapist supporting left knee. Would benefit from SNF to maximize independence and mobility prior to return home. Will follow acutely.    Follow Up Recommendations SNF;Supervision for mobility/OOB;Supervision/Assistance - 24 hour    Equipment Recommendations  Other (comment)(defer)    Recommendations for Other Services       Precautions / Restrictions Precautions Precautions: Fall Restrictions Weight Bearing Restrictions: No      Mobility  Bed Mobility Overal bed mobility: Needs Assistance Bed Mobility: Supine to Sit;Rolling;Sit to Supine Rolling: Max assist   Supine to sit: +2 for physical assistance;Max assist;HOB elevated Sit to supine: Max assist;+2 for physical assistance;HOB elevated   General bed mobility comments: pt able to push Rt LE off EOB and move Rt UE toward EOB. pt requires (A) to elevate trunk and (A) to maintain static sitting. pt sitting with C curved spine to Rt with right neck rotation and eye gaze  Transfers Overall transfer level: Needs assistance Equipment used: 2 person hand  held assist Transfers: Sit to/from Stand Sit to Stand: +2 physical assistance;Max assist         General transfer comment: requires (A) To elevate and block LLE.  Ambulation/Gait             General Gait Details: Unable  Financial trader Rankin (Stroke Patients Only) Modified Rankin (Stroke Patients Only) Pre-Morbid Rankin Score: Slight disability Modified Rankin: Severe disability     Balance Overall balance assessment: Needs assistance Sitting-balance support: Single extremity supported;Feet supported Sitting balance-Leahy Scale: Poor Sitting balance - Comments: strong Rt lean; worked on upright and static sitting balance, head righting and gaze towards midline. No righting reactions noted.   Standing balance support: No upper extremity supported Standing balance-Leahy Scale: Zero Standing balance comment: Max A of 2 for standing balance blocking LLE.                             Pertinent Vitals/Pain Pain Assessment: No/denies pain Faces Pain Scale: Hurts a little bit Pain Intervention(s): Monitored during session;Repositioned    Home Living Family/patient expects to be discharged to:: Skilled nursing facility                      Prior Function Level of Independence: Independent         Comments: cared for 40 yo child - was in care of the child the day of event      Hand Dominance   Dominant Hand: Right    Extremity/Trunk Assessment   Upper Extremity Assessment Upper Extremity Assessment: Defer to  OT evaluation LUE Deficits / Details: flaccid LUE Sensation: decreased light touch;decreased proprioception LUE Coordination: decreased fine motor;decreased gross motor    Lower Extremity Assessment Lower Extremity Assessment: LLE deficits/detail LLE Deficits / Details: Minimal movement noted with hip adduction but otherwise no active movement noted. LLE Sensation: decreased light  touch;decreased proprioception LLE Coordination: decreased fine motor;decreased gross motor    Cervical / Trunk Assessment Cervical / Trunk Assessment: Normal  Communication   Communication: Expressive difficulties  Cognition Arousal/Alertness: Lethargic Behavior During Therapy: Flat affect Overall Cognitive Status: Difficult to assess                                 General Comments: smiling when talking about "her baby" "[name]" - her name is [name] but son Grants says she has never said it right      General Comments General comments (skin integrity, edema, etc.): VSS. Son, [name] present during session.    Exercises     Assessment/Plan    PT Assessment Patient needs continued PT services  PT Problem List Decreased strength;Decreased mobility;Impaired tone;Impaired sensation;Decreased balance;Decreased activity tolerance;Decreased cognition;Decreased range of motion;Decreased coordination;Decreased safety awareness       PT Treatment Interventions Therapeutic activities;Gait training;Patient/family education;Therapeutic exercise;Balance training;Wheelchair mobility training;Neuromuscular re-education;Functional mobility training;Cognitive remediation;DME instruction    PT Goals (Current goals can be found in the Care Plan section)  Acute Rehab PT Goals Patient Stated Goal: to see [name] PT Goal Formulation: With patient Time For Goal Achievement: 12/08/18 Potential to Achieve Goals: Fair    Frequency Min 3X/week   Barriers to discharge Decreased caregiver support lives alone    Co-evaluation PT/OT/SLP Co-Evaluation/Treatment: Yes Reason for Co-Treatment: Complexity of the patient's impairments (multi-system involvement);Necessary to address cognition/behavior during functional activity;To address functional/ADL transfers;For patient/therapist safety PT goals addressed during session: Mobility/safety with mobility OT goals addressed during session: ADL's  and self-care;Proper use of Adaptive equipment and DME;Strengthening/ROM       AM-PAC PT "6 Clicks" Mobility  Outcome Measure Help needed turning from your back to your side while in a flat bed without using bedrails?: Total Help needed moving from lying on your back to sitting on the side of a flat bed without using bedrails?: Total Help needed moving to and from a bed to a chair (including a wheelchair)?: Total Help needed standing up from a chair using your arms (e.g., wheelchair or bedside chair)?: Total Help needed to walk in hospital room?: Total Help needed climbing 3-5 steps with a railing? : Total 6 Click Score: 6    End of Session   Activity Tolerance: Patient tolerated treatment well;Patient limited by lethargy Patient left: in bed;with call bell/phone within reach;with bed alarm set;with SCD's reapplied;with family/visitor present Nurse Communication: Mobility status PT Visit Diagnosis: Hemiplegia and hemiparesis;Difficulty in walking, not elsewhere classified (R26.2) Hemiplegia - Right/Left: Left Hemiplegia - dominant/non-dominant: Non-dominant Hemiplegia - caused by: Cerebral infarction    Time: [id number] PT Time Calculation (min) (ACUTE ONLY): 21 min   Charges:   PT Evaluation $PT Eval Moderate Complexity: 1 Mod          Wray Kearns, PT, DPT Acute Rehabilitation Services Pager [phone] Office [place] 11/24/2018, 4:17 PM

## 2018-11-25 ENCOUNTER — Inpatient Hospital Stay (HOSPITAL_COMMUNITY): Payer: Medicare Other

## 2018-11-25 DIAGNOSIS — R1312 Dysphagia, oropharyngeal phase: Secondary | ICD-10-CM

## 2018-11-25 DIAGNOSIS — G936 Cerebral edema: Secondary | ICD-10-CM

## 2018-11-25 LAB — BASIC METABOLIC PANEL
Anion gap: 13 (ref 5–15)
BUN: 19 mg/dL (ref 8–23)
CO2: 19 mmol/L — ABNORMAL LOW (ref 22–32)
Calcium: 8.3 mg/dL — ABNORMAL LOW (ref 8.9–10.3)
Chloride: 107 mmol/L (ref 98–111)
Creatinine, Ser: 0.97 mg/dL (ref 0.44–1.00)
GFR calc Af Amer: >60 mL/min (ref >60)
GFR calc non Af Amer: 57 mL/min — ABNORMAL LOW (ref >60)
Glucose, Bld: 211 mg/dL — ABNORMAL HIGH (ref 70–99)
Potassium: 3.5 mmol/L (ref 3.5–5.1)
Sodium: 139 mmol/L (ref 135–145)

## 2018-11-25 LAB — CBC
HCT: 32.4 % — ABNORMAL LOW (ref 36.0–46.0)
Hemoglobin: 10 g/dL — ABNORMAL LOW (ref 12.0–15.0)
MCH: 27.7 pg (ref 26.0–34.0)
MCHC: 30.9 g/dL (ref 30.0–36.0)
MCV: 89.8 fL (ref 80.0–100.0)
Platelets: 209 10*3/uL (ref 150–400)
RBC: 3.61 MIL/uL — ABNORMAL LOW (ref 3.87–5.11)
RDW: 17 % — ABNORMAL HIGH (ref 11.5–15.5)
WBC: 7.1 10*3/uL (ref 4.0–10.5)
nRBC: 0.3 % — ABNORMAL HIGH (ref 0.0–0.2)

## 2018-11-25 LAB — GLUCOSE, CAPILLARY
Glucose-Capillary: 183 mg/dL — ABNORMAL HIGH (ref 70–99)
Glucose-Capillary: 184 mg/dL — ABNORMAL HIGH (ref 70–99)
Glucose-Capillary: 159 mg/dL — ABNORMAL HIGH (ref 70–99)
Glucose-Capillary: 125 mg/dL — ABNORMAL HIGH (ref 70–99)
Glucose-Capillary: 183 mg/dL — ABNORMAL HIGH (ref 70–99)

## 2018-11-25 LAB — MAGNESIUM: Magnesium: 1.9 mg/dL (ref 1.7–2.4)

## 2018-11-25 LAB — HEPARIN LEVEL (UNFRACTIONATED)
Heparin Unfractionated: 0.21 IU/mL — ABNORMAL LOW (ref 0.30–0.70)
Heparin Unfractionated: 0.21 IU/mL — ABNORMAL LOW (ref 0.30–0.70)
Heparin Unfractionated: 0.24 IU/mL — ABNORMAL LOW (ref 0.30–0.70)

## 2018-11-25 LAB — PHOSPHORUS: Phosphorus: 2.1 mg/dL — ABNORMAL LOW (ref 2.5–4.6)

## 2018-11-25 MED ORDER — K PHOS MONO-SOD PHOS DI & MONO 155-852-130 MG PO TABS
250.0000 mg | ORAL_TABLET | Freq: Three times a day (TID) | ORAL | Status: AC
  Administered 2018-11-25 – 2018-11-26 (×3): 250 mg
  Filled 2018-11-25 (×3): qty 1

## 2018-11-25 MED ORDER — DICYCLOMINE HCL 10 MG PO CAPS: 10.0000 mg | ORAL_CAPSULE | Freq: Four times a day (QID) | ORAL | Status: DC | PRN

## 2018-11-25 MED ORDER — DICYCLOMINE HCL 10 MG PO CAPS
10.0000 mg | ORAL_CAPSULE | Freq: Four times a day (QID) | ORAL | Status: DC | PRN
  Administered 2018-11-25 – 2018-12-01 (×7): 10 mg
  Filled 2018-11-25 (×10): qty 1

## 2018-11-25 MED ORDER — METOPROLOL TARTRATE 12.5 MG HALF TABLET
12.5000 mg | ORAL_TABLET | Freq: Four times a day (QID) | ORAL | Status: DC
  Administered 2018-11-25 – 2018-11-26 (×4): 12.5 mg
  Filled 2018-11-25 (×5): qty 1

## 2018-11-25 NOTE — Progress Notes (Signed)
EEG complete - results pending 

## 2018-11-25 NOTE — Procedures (Signed)
History: 78 year old female with recent right ICA occlusion being evaluated for spells  Sedation: None  Technique: This is a 21 channel routine scalp EEG performed at the bedside with bipolar and monopolar montages arranged in accordance to the international 10/20 system of electrode placement. One channel was dedicated to EKG recording.    Background: There is a posterior dominant rhythm of 10 Hz which is seen bilaterally, though low amplitude on the right compared to the left.  There is also some right hemispheric delta and theta range slowing which is maximal in the temporal region.  There are moderately frequent high voltage discharges with a very odd field, essentially generalized in nature but not with the typical IGE appearance.  At times these have an appearance that could be cerebral in nature, but at other times appear artifactual and therefore I favor artifact over cortical discharges, but of unclear nature.  It was noted by the tech that the scalp was very difficult with oil and sweat.  Photic stimulation: Physiologic driving is not performed  EEG Abnormalities: 1) right hemispheric delta activity maximal in the temporal region 2) asymmetric PDR  Clinical Interpretation: This EEG recorded evidence of a right hemispheric dysfunction which is consistent with the patient's known right hemispheric infarct.  There was no seizure recorded on this study, and the discharges that were seen are favored to represent artifact.  If there continues to be high index of suspicion of seizures, then long-term monitoring could be pursued.   Roland Rack, MD [facility] [phone]  If 7pm- 7am, please page neurology on call as listed in [place].

## 2018-11-25 NOTE — Progress Notes (Signed)
ANTICOAGULATION CONSULT NOTE   Pharmacy Consult for heparin Indication: atrial fibrillation  No Known Allergies  Patient Measurements: Height: 5\' 1"  (154.9 cm) Weight: 104 lb 15 oz (47.6 kg) IBW/kg (Calculated) : 47.8 Heparin Dosing Weight: 46kg  Vital Signs: Temp: 98.9 F (37.2 C) (09/05 1949) Temp Source: Axillary (09/05 1949) BP: 146/79 (09/05 1949) Pulse Rate: 81 (09/05 1949)  Labs: Recent Labs    11/23/18 2019 11/24/18 0159  11/24/18 2302 11/25/18 0446 11/25/18 0648 11/25/18 2047  HGB  --  9.9*  --   --  10.0*  --   --   HCT  --  30.8*  --   --  32.4*  --   --   PLT  --  218  --   --  209  --   --   HEPARINUNFRC  --  0.43   < > 0.21*  --  0.21* 0.24*  CREATININE 1.03* 1.01*  --   --  0.97  --   --    < > = values in this interval not displayed.    Estimated Creatinine Clearance: 37.1 mL/min (by C-G formula based on SCr of 0.97 mg/dL).   Medical History: Past Medical History:  Diagnosis Date  . Arthritis   . CAD (coronary artery disease)   . Carotid artery occlusion   . Diabetes mellitus   . Hyperlipidemia   . Hypertension   . Myocardial infarction [facility])     Assessment: 78 year old female with new afib. New cva, was given tpa on 8/31. New orders to start IV heparin, will aim for low end of goal and avoid bolusing given recent stroke requiring tpa  Repeat heparin level remains slightly subtherapeutic at 0.24. Heparin off briefly for CT but no infusion issues since.  Goal of Therapy:  Heparin level 0.3-0.5 units/ml Monitor platelets by anticoagulation protocol: Yes   Plan:  -Increase heparin to 950 units/hr -Daily heparin level and CBC   Arrie Senate, PharmD, BCPS Clinical Pharmacist Please check AMION for all [employer] numbers 11/25/2018

## 2018-11-25 NOTE — Progress Notes (Signed)
ANTICOAGULATION CONSULT NOTE   Pharmacy Consult for heparin Indication: atrial fibrillation  No Known Allergies  Patient Measurements: Height: 5\' 1"  (154.9 cm) Weight: 95 lb 10.9 oz (43.4 kg) IBW/kg (Calculated) : 47.8 Heparin Dosing Weight: 46kg  Vital Signs: Temp: 98.2 F (36.8 C) (09/04 2342) Temp Source: Oral (09/04 2342) BP: 133/64 (09/04 1946) Pulse Rate: 85 (09/04 1946)  Labs: Recent Labs    11/22/18 0500 11/23/18 2019 11/24/18 0159 11/24/18 1248 11/24/18 2302  HGB 9.4*  --  9.9*  --   --   HCT 29.2*  --  30.8*  --   --   PLT 214  --  218  --   --   HEPARINUNFRC  --   --  0.43 0.19* 0.21*  CREATININE 1.01* 1.03* 1.01*  --   --     Estimated Creatinine Clearance: 32.5 mL/min (A) (by C-G formula based on SCr of 1.01 mg/dL (H)).   Medical History: Past Medical History:  Diagnosis Date  . Arthritis   . CAD (coronary artery disease)   . Carotid artery occlusion   . Diabetes mellitus   . Hyperlipidemia   . Hypertension   . Myocardial infarction [facility])     Assessment: 78 year old female with new afib. New cva, was given tpa on 8/31. New orders to start IV heparin, will aim for low end of goal and avoid bolusing given recent stroke requiring tpa. Sq lovenox received this morning, hgb stable in 9s.   Heparin level came back subtherapeutic at 0.21.  No evidence of bleeding.  Goal of Therapy:  Heparin level 0.3-0.5 units/ml Monitor platelets by anticoagulation protocol: Yes   Plan:  Increase heparin infusion to 750 units/hr Check heparin Xa level 8 hours Check Heparin level and CBC qam  Thanks for allowing pharmacy to be a part of this patient's care.  Excell Seltzer, PharmD Clinical Pharmacist 11/25/2018, 12:13 AM

## 2018-11-25 NOTE — Progress Notes (Signed)
Pt unavailable due to STAT CT. Will try back later

## 2018-11-25 NOTE — Progress Notes (Signed)
ANTICOAGULATION CONSULT NOTE   Pharmacy Consult for heparin Indication: atrial fibrillation  No Known Allergies  Patient Measurements: Height: 5\' 1"  (154.9 cm) Weight: 104 lb 15 oz (47.6 kg) IBW/kg (Calculated) : 47.8 Heparin Dosing Weight: 46kg  Vital Signs: Temp: 97.7 F (36.5 C) (09/05 0700) Temp Source: Axillary (09/05 0700) BP: 136/79 (09/05 0700) Pulse Rate: 78 (09/05 0658)  Labs: Recent Labs    11/23/18 2019  11/24/18 0159 11/24/18 1248 11/24/18 2302 11/25/18 0446 11/25/18 0648  HGB  --   --  9.9*  --   --  10.0*  --   HCT  --   --  30.8*  --   --  32.4*  --   PLT  --   --  218  --   --  209  --   HEPARINUNFRC  --    < > 0.43 0.19* 0.21*  --  0.21*  CREATININE 1.03*  --  1.01*  --   --  0.97  --    < > = values in this interval not displayed.    Estimated Creatinine Clearance: 37.1 mL/min (by C-G formula based on SCr of 0.97 mg/dL).   Medical History: Past Medical History:  Diagnosis Date  . Arthritis   . CAD (coronary artery disease)   . Carotid artery occlusion   . Diabetes mellitus   . Hyperlipidemia   . Hypertension   . Myocardial infarction [facility])     Assessment: 78 year old female with new afib. New cva, was given tpa on 8/31. New orders to start IV heparin, will aim for low end of goal and avoid bolusing given recent stroke requiring tpa  Heparin level this morning resulted as SUBtherapeutic despite a rate increase early this AM (HL 0.21, goal of 0.3-0.5). CBC stable - no overt bleeding noted.   Goal of Therapy:  Heparin level 0.3-0.5 units/ml Monitor platelets by anticoagulation protocol: Yes   Plan:  - Increase Heparin drip rate to 850 units/hr (8.5 ml/hr) - Will continue to monitor for any signs/symptoms of bleeding and will follow up with heparin level in 8 hours   Thank you for allowing pharmacy to be a part of this patient's care.  Alycia Rossetti, PharmD, BCPS Clinical Pharmacist Clinical phone for 11/25/2018: [phone] 11/25/2018  8:50 AM   **Pharmacist phone directory can now be found on [email or web address] ([email or web address]).  Listed under [place].

## 2018-11-25 NOTE — Progress Notes (Signed)
Progress Note  Patient Name: [name] Date of Encounter: 11/25/2018  Primary Cardiologist:   No primary care provider on file.   Subjective   Trying to communicate.    Inpatient Medications    Scheduled Meds: . aspirin  81 mg Per Tube Daily  . chlorhexidine  15 mL Mouth Rinse BID  . Chlorhexidine Gluconate Cloth  6 each Topical Daily  . diltiazem  30 mg Per Tube Q8H  . hydrochlorothiazide  12.5 mg Oral Daily  . insulin aspart  1-3 Units Subcutaneous Q4H  . irbesartan  300 mg Per Tube Daily  . mouth rinse  15 mL Mouth Rinse q12n4p  . metoprolol tartrate  5 mg Intravenous Q6H  . pantoprazole (PROTONIX) IV  40 mg Intravenous QHS  . sennosides  5 mL Per Tube QHS   Continuous Infusions: . sodium chloride    . diltiazem (CARDIZEM) infusion 5 mg/hr (11/24/18 1900)  . feeding supplement (OSMOLITE 1.2 CAL) 30 mL/hr at 11/24/18 1900  . heparin 850 Units/hr (11/25/18 0905)   PRN Meds: senna-docusate   Vital Signs    Vitals:   11/25/18 0500 11/25/18 0658 11/25/18 0700 11/25/18 0909  BP:   136/79 (!) 144/70  Pulse:  78  77  Resp:  16  16  Temp:   97.7 F (36.5 C)   TempSrc:   Axillary   SpO2:  100%  100%  Weight: 47.6 kg     Height:        Intake/Output Summary (Last 24 hours) at 11/25/2018 1125 Last data filed at 11/25/2018 0615 Gross per 24 hour  Intake 700.78 ml  Output 1650 ml  Net -949.22 ml   Filed Weights   11/20/18 1500 11/24/18 0500 11/25/18 0500  Weight: 46.4 kg 43.4 kg 47.6 kg    Telemetry    NSR, PACs - Personally Reviewed  ECG    NA - Personally Reviewed  Physical Exam   GEN: No acute distress.   Neck: No  JVD Cardiac: RRR, no murmurs, rubs, or gallops.  Respiratory: Clear  to auscultation bilaterally. GI: Soft, nontender, non-distended  MS: No  edema; No deformity. Neuro:  Dense left hemiparesis   Labs    Chemistry Recent Labs  Lab 11/20/18 1518  11/23/18 2019 11/24/18 0159 11/25/18 0446  NA 140   < > 146* 145 139  K  3.9   < > 3.4* 3.6 3.5  CL 106   < > 111 115* 107  CO2 20*   < > 20* 22 19*  GLUCOSE 167*   < > 132* 149* 211*  BUN 20   < > 22 22 19   CREATININE 1.06*   < > 1.03* 1.01* 0.97  CALCIUM 9.2   < > 8.6* 8.7* 8.3*  PROT 6.7  --   --   --   --   ALBUMIN 3.8  --   --   --   --   AST 19  --   --   --   --   ALT 32  --   --   --   --   ALKPHOS 66  --   --   --   --   BILITOT 0.9  --   --   --   --   GFRNONAA 51*   < > 53* 54* 57*  GFRAA 59*   < > >60 >60 >60  ANIONGAP 14   < > 15 8 13    < > =  values in this interval not displayed.     Hematology Recent Labs  Lab 11/22/18 0500 11/24/18 0159 11/25/18 0446  WBC 7.9 8.8 7.1  RBC 3.37* 3.50* 3.61*  HGB 9.4* 9.9* 10.0*  HCT 29.2* 30.8* 32.4*  MCV 86.6 88.0 89.8  MCH 27.9 28.3 27.7  MCHC 32.2 32.1 30.9  RDW 16.8* 17.0* 17.0*  PLT 214 218 209    Cardiac EnzymesNo results for input(s): TROPONINI in the last 168 hours. No results for input(s): TROPIPOC in the last 168 hours.   BNPNo results for input(s): BNP, PROBNP in the last 168 hours.   DDimer No results for input(s): DDIMER in the last 168 hours.   Radiology    Dg Abd 1 View  Result Date: 11/23/2018 CLINICAL DATA:  Nasogastric tube EXAM: ABDOMEN - 1 VIEW COMPARISON:  01/01/2015 CT 05/05/2018 FINDINGS: Esophageal tube tip in the left upper quadrant, side-port at the distal esophagus. Nonobstructed gas pattern.extensive calcifications in the mid and left upper quadrant presumably representing pancreatic calcifications as may be seen with chronic pancreatitis. IMPRESSION: 1. Esophageal tube tip in the left upper quadrant, side-port at the distal esophagus, suggest further advancement for more optimal positioning 2. Extensive calcifications in the central and left upper quadrant felt to correspond to pancreatic calcifications/chronic pancreatitis 3. Nonobstructed gas pattern Electronically Signed   By: Donavan Foil M.D.   On: 11/23/2018 19:57    Cardiac Studies   ECHO:   1. Akinesis  of the left ventricular, entire inferior wall and inferolateral wall.  2. The left ventricle has moderately reduced systolic function, with an ejection fraction of 35-40%. The cavity size was normal. There is mild concentric left ventricular hypertrophy. Left ventricular diastolic Doppler parameters are consistent with  pseudonormalization. Elevated mean left atrial pressure.  3. The right ventricle has normal systolic function. The cavity was normal. There is no increase in right ventricular wall thickness. Right ventricular systolic pressure could not be assessed.  4. Left atrial size was mildly dilated.  5. There is mild mitral annular calcification present.  6. The aorta is normal unless otherwise noted.  Patient Profile     78 y.o. female with a hx of CAD, MI 20 years ago, cardiomyopathy with EF of 30-35% by echo, bilateral carotid artery stenosis with left ICA occlsuion and right carotid endarterectomy (2008),CVA, HTN, HLD, DM, and mitral valve insufficiencywho is being seen for the evaluation of atrial fibrillation.She saw vascular surgery in 2018 for carotid artery stenosis. She has complete occlusion of her left ICA and 40-59% stenosis in the R ICA. No intervention at that time. 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 Notes reveal an echocardiogram with an EF of 30%, unknown if this is ischemic in nature. Per son she was on anticoagulation for a while but does not know when it was discontinued. He states that she has been fully active taking care of her grand daughter, driving,cooking and full independent until Monday when he found her down laying in her bed, she presented to [facility] with left-sided neglect and eye deviation to the right. CT head with R M1 occlusion and family decided to proceed with thrombectomy/TPA. She was hypertensive and tachycardic. She was intubated prior to the procedure. Unfortunately, revascularization was unsuccessful.  Assessment & Plan     ATRIAL FIB WITH RVR:   NSR with PACs.  OK to discontinue the Cardizem IV.  Start metoprolol via tube.  On Cardizem via tube.  DOAC when OK with neurology.  CARDIOMYOPATHY:  Appears to be euvolemic.  Will change to PO beta blocker and then titrate over time.     For questions or updates, please contact [place] Please consult [email or web address] for contact info under Cardiology/STEMI.   Signed, Minus Breeding, MD  11/25/2018, 11:25 AM

## 2018-11-25 NOTE — Progress Notes (Signed)
STROKE TEAM PROGRESS NOTE   INTERVAL HISTORY Patient RN and son are at the bedside. As per RN and son, pt was more awake alert this morning, able to talk, and interactive, although still lethargic. However, around noon, pt seems more drowsy sleepy and not answer questions and hard to arouse with cheyne stokes breathing pattern.   Vitals:   11/25/18 0909 11/25/18 1143 11/25/18 1355 11/25/18 1509  BP: (!) 144/70 (!) 143/74 (!) 158/79 (!) 163/71  Pulse: 77 80  77  Resp: 16 16 16  (!) 22  Temp:  98.2 F (36.8 C)    TempSrc:      SpO2: 100% 100%  100%  Weight:      Height:        CBC:  Recent Labs  Lab 11/20/18 1518  11/21/18 0538  11/24/18 0159 11/25/18 0446  WBC 6.5  --  6.2   < > 8.8 7.1  NEUTROABS 3.0  --  4.6  --   --   --   HGB 12.0   < > 9.2*   < > 9.9* 10.0*  HCT 38.0   < > 29.0*   < > 30.8* 32.4*  MCV 89.4  --  88.7   < > 88.0 89.8  PLT 234  --  224   < > 218 209   < > = values in this interval not displayed.    Basic Metabolic Panel:  Recent Labs  Lab 11/24/18 0159 11/24/18 1829 11/25/18 0446  NA 145  --  139  K 3.6  --  3.5  CL 115*  --  107  CO2 22  --  19*  GLUCOSE 149*  --  211*  BUN 22  --  19  CREATININE 1.01*  --  0.97  CALCIUM 8.7*  --  8.3*  MG 2.1 1.9 1.9  PHOS 2.7 2.2* 2.1*   Lipid Panel:     Component Value Date/Time   CHOL 124 11/21/2018 0538   TRIG 106 11/23/2018 0221   HDL 39 (L) 11/21/2018 0538   CHOLHDL 3.2 11/21/2018 0538   VLDL 13 11/21/2018 0538   LDLCALC 72 11/21/2018 0538   HgbA1c:  Lab Results  Component Value Date   HGBA1C 6.6 (H) 11/21/2018    IMAGING  Ct Head Code Stroke Wo Contrast 11/20/2018 1. Subtle hyperdense right MCA. There is occlusion of the right distal M1 on CTA today. 2. ASPECTS is 10 3. No acute infarct or hemorrhage   Ct Angio Head W Or Wo Contrast Ct Angio Neck W Or Wo Contrast 11/20/2018 1. Occlusion right M1 segment distally with poor flow in the right M2 and M3 branches. 2. Postop right carotid  endarterectomy 2008. Right carotid bifurcation widely patent 3. Severe atherosclerotic disease left carotid bifurcation with occlusion of the left internal carotid artery. 4. Left anterior and left middle cerebral arteries appear patent without significant stenosis through collateral flow through the anterior communicating artery. 5. Posterior circulation intact    Cerebral Angiogram 11/20/2018 1.occluded Rt MCA distal M1 seg. Unsuccessful revascularization Despite x 2 passes with 58mm xc 29mm embotrap retriever device and penumbra aspiration  due to underlying hard ICAD. Unsuccessful intracranial rescue stenting dut vascular tortuosity.  Mr Brain Wo Contrast 11/21/2018 8 cm confluent region of infarction in the right middle cerebral artery territory affecting the insula and frontoparietal brain. Mild swelling but no evidence of hemorrhage at this time or of significant mass effect or any shift. Old infarctions within the right cerebellum, left  occipital lobe and at the left frontoparietal vertex.   Dg Chest Port 1 View 11/20/2018 1. The endotracheal tube is borderline low. Repositioning should be considered. 2. Cardiomegaly with mild vascular congestion.  11/21/2018 Endotracheal tube in satisfactory position. No acute abnormality noted.    2D Echocardiogram  1. Akinesis of the left ventricular, entire inferior wall and inferolateral wall.  2. The left ventricle has moderately reduced systolic function, with an ejection fraction of 35-40%. The cavity size was normal. There is mild concentric left ventricular hypertrophy. Left ventricular diastolic Doppler parameters are consistent with pseudonormalization. Elevated mean left atrial pressure.  3. The right ventricle has normal systolic function. The cavity was normal. There is no increase in right ventricular wall thickness. Right ventricular systolic pressure could not be assessed.  4. Left atrial size was mildly dilated.  5. There is mild mitral  annular calcification present.  6. The aorta is normal unless otherwise noted.  Ct Head Wo Contrast  Result Date: 11/25/2018 CLINICAL DATA:  Change in mental status, recent stroke, follow-up, history diabetes mellitus, hypertension, coronary artery disease post MI, former smoker EXAM: CT HEAD WITHOUT CONTRAST TECHNIQUE: Contiguous axial images were obtained from the base of the skull through the vertex without intravenous contrast. Sagittal and coronal MPR images reconstructed from axial data set. COMPARISON:  CT head 11/20/2018 Correlation: MR brain 11/21/2018 FINDINGS: Brain: Normal ventricular morphology. Generalized atrophy. Mild RIGHT to LEFT midline shift of 3 mm. Subacute evolving large RIGHT MCA territory infarct, distribution corresponding to area positivity on diffusion-weighted imaging on prior MR. No hemorrhagic transformation. Old LEFT occipital infarct. Small vessel chronic ischemic changes of deep cerebral white matter. No intracranial hemorrhage, mass lesion, or new area of infarction. No extra-axial fluid collections. Vascular: No definite hyperdense vessels. Atherosclerotic calcifications of internal carotid arteries at skull base. Skull: Intact Sinuses/Orbits: Clear Other: N/A IMPRESSION: Subacute large RIGHT MCA territory infarct with 3 mm of RIGHT to LEFT midline shift, new since 11/20/2018 but consistent with large subacute infarct. Underlying atrophy with small vessel chronic ischemic changes of deep cerebral white matter. No additional new intracranial abnormalities. Electronically Signed   By: Lavonia Dana M.D.   On: 11/25/2018 13:35   Dg Chest Port 1 View  Result Date: 11/25/2018 CLINICAL DATA:  SOB EXAM: PORTABLE CHEST 1 VIEW COMPARISON:  Chest radiograph dated 11/21/2018, 11/20/2018 FINDINGS: Patient rotation somewhat limits evaluation. Stable cardiomediastinal contours with enlarged heart size. Aortic arch calcifications. Interval extubation and placement of an enteric tube  coursing below the diaphragm with the distal tip out of field of view. No new focal infiltrate identified. Lungs are mildly hyperinflated. Bilateral increased bronchial markings are similar to prior. No large pleural effusion. Small area of lucency at the right lung base is likely artifactual. Visualized skeleton is unremarkable. IMPRESSION: 1. Interval extubation and placement of an enteric tube coursing below the diaphragm with the distal tip out of field of view. 2. Chronic bilateral increased bronchial markings likely chronic bronchitis. No definite new focal infiltrate. 3. Small area of lucency at the right lung base is likely artifactual. Consider repeat radiograph, preferably PA and lateral radiographs, to exclude small pneumothorax. Electronically Signed   By: Audie Pinto M.D.   On: 11/25/2018 13:39   EEG - evidence of a right hemispheric dysfunction which is consistent with the patient's known right hemispheric infarct.   PHYSICAL EXAM   Elderly African-American lady drowsy and hard to arouse. Afebrile. Head is nontraumatic. Neck is supple without bruit.    Cardiac  exam no murmur or gallop. Lungs are coarse rhonchi. Distal pulses are well felt.  Neurological Exam : Patient is drowsy sleepy and require repetitive stimulation to open eyes, moaning with pain but no speech output, only followed one simple commands but no other commands.  She has right gaze deviation.  She is not able to follow gaze to the left.  She blinks to threat on the right but not on the left.  Pupils equal reactive.  Fundi not visualized. She does move right upper and lower extremity against gravity.  She withdraws left lower extremity partially to pain.  Does not withdraw left upper extremity. Tone is diminished on the left and normal on the right.  Right plantars downgoing left is equivocal.  ASSESSMENT/PLAN [name] is a 78 y.o. female with history of myocardial infarction, hypertension, hyperlipidemia,  diabetes, carotid artery occlusion on the left, CAD and arthritis presenting with L sided neglect, R eye deviation and L side flaccid. Received tPA 11/20/2018 at 1545. CTA showed R M1 occlusion. Taken to IR.  SBP > 220.   Stroke: R MCA insular/frontoparietal infarct due to right ICA and MCA occlusion s/p tPA and unsuccessful IR - embolic likely from new diagnosed AF  Code Stroke CT head subtle hyperdense R MCA.     CTA head & neck R M1 Occlusion w/ poor flor R M2 and R M3. R CEA 2008 w/ patent bifurcation. Severe L ICA bifurcation atherosclerosis w/ occlusion L ICA.   Cerebral angio occluded R M1. Unsuccessful revascularization after 2 passes w/ embotrap and penumbra aspiration d/t underlying hard atherosclerosis and vascular tortuosity.  Post IR CT no ICH or mass effect  MRI R MCA infarct insular and frontoparietal. Old R cerebellar, L occipital, L frontoparietal vertex infarcts.  CT repeat 9/5 right large infarct with 46mm MLS, no hemorrhage  2D Echo EF 35-40%. LA mildly dilated. No source of embolus   LDL 72  HgbA1c 6.6   EEG 11/25/18 - evidence of a right hemispheric dysfunction which is consistent with the patient's known right hemispheric infarct.  Heparin IV  for VTE prophylaxis  aspirin 81 mg daily and pletal prior to admission, now on aspirin 81mg  and heparin IV.  Therapy recommendations:  SNF  Disposition:  pending  (lives alone, son checks on daily)  Acute Respiratory Failure w/ hypoxia  Intubated for IR, not extubated at the end of the procedure d/t tPA administration and sheath left in  Sheath now out  Extubated 9/2   Atrial Fibrillation w/ RVR, confirmed, new dx  telemetry monitoring showed AF    Treated with metoprolol  Cardiology consulted, appreciate help  cardizem switched to IV  On metoprolol 12.5mg  bid  Pt on IV Heparin per pharmacy dosing started 11/23/18   Dysphagia  Secondary to stroke  Failed MBSS  On TF @ 50  On NS at 50h  SLP on  board  Carotid Disease  L ICA occlusion  Hx of right CEA 2008  Hypertensive Urgency  SBP > 220 in ED, as high as 222/128  Home meds:  norvasc 10, clonidine 0.1 bid, labetalol 200 bid, valsartan 320-12.5 daily  Treated w/ cleviprex, Now off.   Now on avapro, metoprolol, cardizem  BP stable  Long-term BP goal normotensive  Hyperlipidemia  Home meds:  zocor 20  LDL 72, goal < 70  Resume statin once able to swallow  Continue statin at discharge  Diabetes type II Controlled  Home meds:  Glipizide 5, glipizide-metformin 5-500 bid,  Januvia 100  HgbA1c 6.6, goal < 7.0  CBGs  SSI  DB RN on baord   Other Stroke Risk Factors  Advanced age  Former Cigarette smoker  Coronary artery disease s/p MI    Other Active Problems  R DP and PT not dopplerable prior to or post IR  Acute blood loss anemia 13.9->9.2->9.4->10.0  Hospital day # 5   I spent  35 minutes in total face-to-face time with the patient, more than 50% of which was spent in counseling and coordination of care, reviewing test results, images and medication, and discussing the diagnosis of right MCA stroke, cerebral edema, altered mental status, A. fib RVR, dysphagia and carotid occlusion, treatment plan and potential prognosis. This patient's care requiresreview of multiple databases, neurological assessment, discussion with son at bedside, Dr. Leonel Ramsay about EEG, other specialists and medical decision making of high complexity.  Rosalin Hawking, MD PhD Stroke Neurology 11/25/2018 8:22 PM   To contact Stroke Continuity provider, please refer to [email or web address] After hours, contact General Neurology

## 2018-11-26 DIAGNOSIS — R338 Other retention of urine: Secondary | ICD-10-CM

## 2018-11-26 DIAGNOSIS — E876 Hypokalemia: Secondary | ICD-10-CM

## 2018-11-26 DIAGNOSIS — E43 Unspecified severe protein-calorie malnutrition: Secondary | ICD-10-CM

## 2018-11-26 LAB — URINALYSIS, COMPLETE (UACMP) WITH MICROSCOPIC
Bilirubin Urine: NEGATIVE
Glucose, UA: NEGATIVE mg/dL
Hgb urine dipstick: NEGATIVE
Ketones, ur: NEGATIVE mg/dL
Leukocytes,Ua: NEGATIVE
Nitrite: NEGATIVE
Protein, ur: NEGATIVE mg/dL
Specific Gravity, Urine: 1.013 (ref 1.005–1.030)
pH: 6 (ref 5.0–8.0)

## 2018-11-26 LAB — GLUCOSE, CAPILLARY
Glucose-Capillary: 134 mg/dL — ABNORMAL HIGH (ref 70–99)
Glucose-Capillary: 138 mg/dL — ABNORMAL HIGH (ref 70–99)
Glucose-Capillary: 139 mg/dL — ABNORMAL HIGH (ref 70–99)
Glucose-Capillary: 144 mg/dL — ABNORMAL HIGH (ref 70–99)
Glucose-Capillary: 189 mg/dL — ABNORMAL HIGH (ref 70–99)
Glucose-Capillary: 194 mg/dL — ABNORMAL HIGH (ref 70–99)
Glucose-Capillary: 203 mg/dL — ABNORMAL HIGH (ref 70–99)

## 2018-11-26 LAB — BASIC METABOLIC PANEL
Anion gap: 11 (ref 5–15)
BUN: 16 mg/dL (ref 8–23)
CO2: 25 mmol/L (ref 22–32)
Calcium: 8.5 mg/dL — ABNORMAL LOW (ref 8.9–10.3)
Chloride: 103 mmol/L (ref 98–111)
Creatinine, Ser: 0.91 mg/dL (ref 0.44–1.00)
GFR calc Af Amer: >60 mL/min (ref >60)
GFR calc non Af Amer: >60 mL/min (ref >60)
Glucose, Bld: 188 mg/dL — ABNORMAL HIGH (ref 70–99)
Potassium: 3.1 mmol/L — ABNORMAL LOW (ref 3.5–5.1)
Sodium: 139 mmol/L (ref 135–145)

## 2018-11-26 LAB — CBC
HCT: 31.8 % — ABNORMAL LOW (ref 36.0–46.0)
Hemoglobin: 10.4 g/dL — ABNORMAL LOW (ref 12.0–15.0)
MCH: 28 pg (ref 26.0–34.0)
MCHC: 32.7 g/dL (ref 30.0–36.0)
MCV: 85.7 fL (ref 80.0–100.0)
Platelets: 238 10*3/uL (ref 150–400)
RBC: 3.71 MIL/uL — ABNORMAL LOW (ref 3.87–5.11)
RDW: 16.5 % — ABNORMAL HIGH (ref 11.5–15.5)
WBC: 6.4 10*3/uL (ref 4.0–10.5)
nRBC: 0.5 % — ABNORMAL HIGH (ref 0.0–0.2)

## 2018-11-26 LAB — PHOSPHORUS: Phosphorus: 3 mg/dL (ref 2.5–4.6)

## 2018-11-26 LAB — HEPARIN LEVEL (UNFRACTIONATED): Heparin Unfractionated: 0.45 IU/mL (ref 0.30–0.70)

## 2018-11-26 MED ORDER — METOPROLOL TARTRATE 25 MG PO TABS
25.0000 mg | ORAL_TABLET | Freq: Two times a day (BID) | ORAL | Status: DC
  Administered 2018-11-26: 25 mg
  Filled 2018-11-26: qty 1

## 2018-11-26 MED ORDER — ACETAMINOPHEN 325 MG PO TABS
650.0000 mg | ORAL_TABLET | Freq: Four times a day (QID) | ORAL | Status: DC | PRN
  Filled 2018-11-26: qty 2

## 2018-11-26 MED ORDER — APIXABAN 5 MG PO TABS
5.0000 mg | ORAL_TABLET | Freq: Two times a day (BID) | ORAL | Status: DC
  Administered 2018-11-26 – 2018-11-28 (×5): 5 mg
  Filled 2018-11-26 (×5): qty 1

## 2018-11-26 MED ORDER — POTASSIUM CHLORIDE 20 MEQ/15ML (10%) PO SOLN
40.0000 meq | ORAL | Status: DC
  Filled 2018-11-26: qty 30

## 2018-11-26 MED ORDER — FREE WATER
200.0000 mL | Status: DC
  Administered 2018-11-26 – 2018-12-01 (×29): 200 mL

## 2018-11-26 MED ORDER — ACETAMINOPHEN 325 MG PO TABS
650.0000 mg | ORAL_TABLET | Freq: Four times a day (QID) | ORAL | Status: DC | PRN
  Administered 2018-11-26 – 2018-12-02 (×7): 650 mg
  Filled 2018-11-26 (×7): qty 2

## 2018-11-26 MED ORDER — POTASSIUM CHLORIDE 20 MEQ/15ML (10%) PO SOLN
40.0000 meq | ORAL | Status: AC
  Administered 2018-11-26 (×2): 40 meq
  Filled 2018-11-26: qty 30

## 2018-11-26 NOTE — Progress Notes (Signed)
STROKE TEAM PROGRESS NOTE   INTERVAL HISTORY Patient RN is at the bedside. As per RN, pt more awake alert today and answer questions appropriate. Pt on my rounding, lethargic but open eyes with constant stimulation, orientated to year, self, age and place, not to month. Severe dysarthria. Right gaze preference and right hemiplegia. Urinary retention with ~500 bladder scan, has had 5 I&Os, will need foley. K 3.1, will give supplement.   Vitals:   11/26/18 0000 11/26/18 0349 11/26/18 0500 11/26/18 0610  BP: (!) 165/74 (!) 142/86  (!) 147/75  Pulse: 98 74  76  Resp: (!) 23 (!) 21 19   Temp: 98.7 F (37.1 C) 98.5 F (36.9 C)    TempSrc: Axillary Axillary    SpO2: 100% 100%    Weight:  47.5 kg    Height:        CBC:  Recent Labs  Lab 11/20/18 1518  11/21/18 0538  11/25/18 0446 11/26/18 0503  WBC 6.5  --  6.2   < > 7.1 6.4  NEUTROABS 3.0  --  4.6  --   --   --   HGB 12.0   < > 9.2*   < > 10.0* 10.4*  HCT 38.0   < > 29.0*   < > 32.4* 31.8*  MCV 89.4  --  88.7   < > 89.8 85.7  PLT 234  --  224   < > 209 238   < > = values in this interval not displayed.    Basic Metabolic Panel:  Recent Labs  Lab 11/24/18 1829 11/25/18 0446 11/26/18 0503  NA  --  139 139  K  --  3.5 3.1*  CL  --  107 103  CO2  --  19* 25  GLUCOSE  --  211* 188*  BUN  --  19 16  CREATININE  --  0.97 0.91  CALCIUM  --  8.3* 8.5*  MG 1.9 1.9  --   PHOS 2.2* 2.1* 3.0   Lipid Panel:     Component Value Date/Time   CHOL 124 11/21/2018 0538   TRIG 106 11/23/2018 0221   HDL 39 (L) 11/21/2018 0538   CHOLHDL 3.2 11/21/2018 0538   VLDL 13 11/21/2018 0538   LDLCALC 72 11/21/2018 0538   HgbA1c:  Lab Results  Component Value Date   HGBA1C 6.6 (H) 11/21/2018    IMAGING  Ct Head Code Stroke Wo Contrast 11/20/2018 1. Subtle hyperdense right MCA. There is occlusion of the right distal M1 on CTA today. 2. ASPECTS is 10 3. No acute infarct or hemorrhage   Ct Angio Head W Or Wo Contrast Ct Angio Neck W Or  Wo Contrast 11/20/2018 1. Occlusion right M1 segment distally with poor flow in the right M2 and M3 branches. 2. Postop right carotid endarterectomy 2008. Right carotid bifurcation widely patent 3. Severe atherosclerotic disease left carotid bifurcation with occlusion of the left internal carotid artery. 4. Left anterior and left middle cerebral arteries appear patent without significant stenosis through collateral flow through the anterior communicating artery. 5. Posterior circulation intact    Cerebral Angiogram 11/20/2018 1.occluded Rt MCA distal M1 seg. Unsuccessful revascularization Despite x 2 passes with 15mm xc 72mm embotrap retriever device and penumbra aspiration  due to underlying hard ICAD. Unsuccessful intracranial rescue stenting dut vascular tortuosity.  Mr Brain Wo Contrast 11/21/2018 8 cm confluent region of infarction in the right middle cerebral artery territory affecting the insula and frontoparietal brain. Mild swelling but  no evidence of hemorrhage at this time or of significant mass effect or any shift. Old infarctions within the right cerebellum, left occipital lobe and at the left frontoparietal vertex.   Dg Chest Port 1 View 11/20/2018 1. The endotracheal tube is borderline low. Repositioning should be considered. 2. Cardiomegaly with mild vascular congestion.  11/21/2018 Endotracheal tube in satisfactory position. No acute abnormality noted.    2D Echocardiogram  1. Akinesis of the left ventricular, entire inferior wall and inferolateral wall.  2. The left ventricle has moderately reduced systolic function, with an ejection fraction of 35-40%. The cavity size was normal. There is mild concentric left ventricular hypertrophy. Left ventricular diastolic Doppler parameters are consistent with pseudonormalization. Elevated mean left atrial pressure.  3. The right ventricle has normal systolic function. The cavity was normal. There is no increase in right ventricular wall  thickness. Right ventricular systolic pressure could not be assessed.  4. Left atrial size was mildly dilated.  5. There is mild mitral annular calcification present.  6. The aorta is normal unless otherwise noted.  Ct Head Wo Contrast  Result Date: 11/25/2018 CLINICAL DATA:  Change in mental status, recent stroke, follow-up, history diabetes mellitus, hypertension, coronary artery disease post MI, former smoker EXAM: CT HEAD WITHOUT CONTRAST TECHNIQUE: Contiguous axial images were obtained from the base of the skull through the vertex without intravenous contrast. Sagittal and coronal MPR images reconstructed from axial data set. COMPARISON:  CT head 11/20/2018 Correlation: MR brain 11/21/2018 FINDINGS: Brain: Normal ventricular morphology. Generalized atrophy. Mild RIGHT to LEFT midline shift of 3 mm. Subacute evolving large RIGHT MCA territory infarct, distribution corresponding to area positivity on diffusion-weighted imaging on prior MR. No hemorrhagic transformation. Old LEFT occipital infarct. Small vessel chronic ischemic changes of deep cerebral white matter. No intracranial hemorrhage, mass lesion, or new area of infarction. No extra-axial fluid collections. Vascular: No definite hyperdense vessels. Atherosclerotic calcifications of internal carotid arteries at skull base. Skull: Intact Sinuses/Orbits: Clear Other: N/A IMPRESSION: Subacute large RIGHT MCA territory infarct with 3 mm of RIGHT to LEFT midline shift, new since 11/20/2018 but consistent with large subacute infarct. Underlying atrophy with small vessel chronic ischemic changes of deep cerebral white matter. No additional new intracranial abnormalities. Electronically Signed   By: Lavonia Dana M.D.   On: 11/25/2018 13:35   Dg Chest Port 1 View  Result Date: 11/25/2018 CLINICAL DATA:  SOB EXAM: PORTABLE CHEST 1 VIEW COMPARISON:  Chest radiograph dated 11/21/2018, 11/20/2018 FINDINGS: Patient rotation somewhat limits evaluation. Stable  cardiomediastinal contours with enlarged heart size. Aortic arch calcifications. Interval extubation and placement of an enteric tube coursing below the diaphragm with the distal tip out of field of view. No new focal infiltrate identified. Lungs are mildly hyperinflated. Bilateral increased bronchial markings are similar to prior. No large pleural effusion. Small area of lucency at the right lung base is likely artifactual. Visualized skeleton is unremarkable. IMPRESSION: 1. Interval extubation and placement of an enteric tube coursing below the diaphragm with the distal tip out of field of view. 2. Chronic bilateral increased bronchial markings likely chronic bronchitis. No definite new focal infiltrate. 3. Small area of lucency at the right lung base is likely artifactual. Consider repeat radiograph, preferably PA and lateral radiographs, to exclude small pneumothorax. Electronically Signed   By: Audie Pinto M.D.   On: 11/25/2018 13:39   EEG - evidence of a right hemispheric dysfunction which is consistent with the patient's known right hemispheric infarct.   PHYSICAL EXAM  Elderly African-American lady drowsy but arousable with repetitive stimulation. Afebrile. Head is nontraumatic. Neck is supple without bruit.    Cardiac exam no murmur or gallop. Lungs with coarse rhonchi. Distal pulses are well felt.  Neurological Exam : Patient is lethargic but arousable with repetitive stimulation to open eyes, severe dysarthria but orientated to place, age, self and year, but not to month, followed most simple commands.  She has right gaze deviation.  She is not able to follow gaze to the left.  She blinks to threat inconsistently bilaterally.  Pupils equal reactive.  Fundi not visualized. left facial droop. She does move right upper and lower extremity against gravity.  She withdraws left lower extremity mildly to pain.  Does not withdraw left upper extremity. Tone is diminished on the left and normal on  the right.  Right plantars downgoing left is equivocal.  ASSESSMENT/PLAN Ms. ELLORY REARDEN is a 78 y.o. female with history of myocardial infarction, hypertension, hyperlipidemia, diabetes, carotid artery occlusion on the left, CAD and arthritis presenting with L sided neglect, R eye deviation and L side flaccid. Received tPA 11/20/2018 at 1545. CTA showed R M1 occlusion. Taken to IR.  SBP > 220.   Stroke: R MCA insular/frontoparietal infarct due to right ICA and MCA occlusion s/p tPA and unsuccessful IR - embolic likely from new diagnosed AF  Code Stroke CT head subtle hyperdense R MCA.     CTA head & neck R M1 Occlusion w/ poor flor R M2 and R M3. R CEA 2008 w/ patent bifurcation. Severe L ICA bifurcation atherosclerosis w/ occlusion L ICA.   Cerebral angio occluded R M1. Unsuccessful revascularization after 2 passes w/ embotrap and penumbra aspiration d/t underlying hard atherosclerosis and vascular tortuosity.  Post IR CT no ICH or mass effect  MRI R MCA infarct insular and frontoparietal. Old R cerebellar, L occipital, L frontoparietal vertex infarcts.  CT repeat 9/5 right large infarct with 50mm MLS, no hemorrhage  2D Echo EF 35-40%. LA mildly dilated. No source of embolus   LDL 72  HgbA1c 6.6   EEG 11/25/18 - evidence of a right hemispheric dysfunction which is consistent with the patient's known right hemispheric infarct.  Heparin IV  for VTE prophylaxis  aspirin 81 mg daily and pletal prior to admission, now on aspirin 81mg  and heparin IV. Will switch heparin IV to eliquis.  Therapy recommendations:  SNF  Disposition:  pending  (lives alone, son checks on daily)  Acute Respiratory Failure w/ hypoxia  Intubated for IR, not extubated at the end of the procedure d/t tPA administration and sheath left in  Sheath now out  Extubated 9/2   Cheyne stokes breathing pattern 9520  Atrial Fibrillation w/ RVR, confirmed, new dx  telemetry monitoring showed AF    Treated  with metoprolol  Cardiology consulted, appreciate help  cardizem switched to IV  On metoprolol 12.5mg  bid  Pt on IV Heparin per pharmacy dosing started 11/23/18, will switch to eliquis   Dysphagia  Secondary to stroke  Failed MBSS  On TF @ 50  On free water 200 Q4  SLP on board  Carotid Disease  L ICA occlusion  Hx of right CEA 2008  Hypertensive Urgency  SBP > 220 in ED, as high as 222/128  Home meds:  norvasc 10, clonidine 0.1 bid, labetalol 200 bid, valsartan 320-12.5 daily  Treated w/ cleviprex, Now off.   Now on avapro, metoprolol, cardizem  BP stable  Long-term BP goal normotensive  Hyperlipidemia  Home meds:  zocor 20  LDL 72, goal < 70  Resume statin once able to swallow  Continue statin at discharge  Diabetes type II Controlled  Home meds:  Glipizide 5, glipizide-metformin 5-500 bid, Januvia 100  HgbA1c 6.6, goal < 7.0  CBGs  SSI  DB RN on baord   Other Stroke Risk Factors  Advanced age  Former Cigarette smoker  Coronary artery disease s/p MI    Other Active Problems  R DP and PT not dopplerable prior to or post IR  Acute blood loss anemia 13.9->9.2->9.4->10.0  Urinary retention s/p multiple I&Os - will put on foley, UA pending  Hypokalemia - K 3.1-> supplement, Mg pending in am  Hospital day # 6   Alison Hawking, MD PhD Stroke Neurology 11/26/2018 11:30 AM   To contact Stroke Continuity provider, please refer to [email or web address] After hours, contact General Neurology

## 2018-11-26 NOTE — Progress Notes (Signed)
Dr. Lorraine Lax informed of pt's potassium 3.1, trend down.

## 2018-11-26 NOTE — Discharge Instructions (Addendum)

## 2018-11-26 NOTE — Progress Notes (Signed)
Dr. Lorraine Lax informed of pt's bladder scan of 628cc.

## 2018-11-26 NOTE — Progress Notes (Signed)
Bladder scan revealed 458cc urine. Pt stated she felt like she could try to urinate, and ambulated with 2 person assist to bedside commode.  Pt was unable to urinate, but did have a bowel movement. Dr. Erlinda Hong notified, orders received to place foley. Pt returned to bed. Will place foley catheter.

## 2018-11-26 NOTE — Progress Notes (Signed)
Progress Note  Patient Name: Cala Bradford Date of Encounter: 11/26/2018  Primary Cardiologist:   No primary care provider on file.   Subjective   No chest pain. She is able to answer questions.  Denies SOB.   Inpatient Medications    Scheduled Meds: . aspirin  81 mg Per Tube Daily  . chlorhexidine  15 mL Mouth Rinse BID  . Chlorhexidine Gluconate Cloth  6 each Topical Daily  . diltiazem  30 mg Per Tube Q8H  . hydrochlorothiazide  12.5 mg Oral Daily  . insulin aspart  1-3 Units Subcutaneous Q4H  . irbesartan  300 mg Per Tube Daily  . mouth rinse  15 mL Mouth Rinse q12n4p  . metoprolol tartrate  12.5 mg Per Tube Q6H  . pantoprazole (PROTONIX) IV  40 mg Intravenous QHS  . sennosides  5 mL Per Tube QHS   Continuous Infusions: . sodium chloride    . feeding supplement (OSMOLITE 1.2 CAL) Stopped (11/25/18 1300)  . heparin 950 Units/hr (11/26/18 0742)   PRN Meds: dicyclomine, senna-docusate   Vital Signs    Vitals:   11/26/18 0349 11/26/18 0500 11/26/18 0610 11/26/18 0752  BP: (!) 142/86  (!) 147/75 (!) 139/93  Pulse: 74  76   Resp: (!) 21 19  18   Temp: 98.5 F (36.9 C)   98 F (36.7 C)  TempSrc: Axillary   Oral  SpO2: 100%   100%  Weight: 47.5 kg     Height:        Intake/Output Summary (Last 24 hours) at 11/26/2018 0948 Last data filed at 11/26/2018 0742 Gross per 24 hour  Intake 1676.74 ml  Output 1828 ml  Net -151.26 ml   Filed Weights   11/24/18 0500 11/25/18 0500 11/26/18 0349  Weight: 43.4 kg 47.6 kg 47.5 kg    Telemetry    NSR, with ectopy - Personally Reviewed  ECG    NA - Personally Reviewed  Physical Exam   GEN: No  acute distress.   Neck: No  JVD Cardiac: RRR, soft 2/6 apical systolic murmur, no diastolic murmurs, rubs, or gallops.  Respiratory: Clear   to auscultation bilaterally. GI: Soft, nontender, non-distended, normal bowel sounds  MS:  No edema; No deformity. Neuro:   Dense hemiparesis.    Labs    Chemistry Recent Labs   Lab 11/20/18 1518  11/24/18 0159 11/25/18 0446 11/26/18 0503  NA 140   < > 145 139 139  K 3.9   < > 3.6 3.5 3.1*  CL 106   < > 115* 107 103  CO2 20*   < > 22 19* 25  GLUCOSE 167*   < > 149* 211* 188*  BUN 20   < > 22 19 16   CREATININE 1.06*   < > 1.01* 0.97 0.91  CALCIUM 9.2   < > 8.7* 8.3* 8.5*  PROT 6.7  --   --   --   --   ALBUMIN 3.8  --   --   --   --   AST 19  --   --   --   --   ALT 32  --   --   --   --   ALKPHOS 66  --   --   --   --   BILITOT 0.9  --   --   --   --   GFRNONAA 51*   < > 54* 57* >60  GFRAA 59*   < > >  60 >60 >60  ANIONGAP 14   < > 8 13 11    < > = values in this interval not displayed.     Hematology Recent Labs  Lab 11/24/18 0159 11/25/18 0446 11/26/18 0503  WBC 8.8 7.1 6.4  RBC 3.50* 3.61* 3.71*  HGB 9.9* 10.0* 10.4*  HCT 30.8* 32.4* 31.8*  MCV 88.0 89.8 85.7  MCH 28.3 27.7 28.0  MCHC 32.1 30.9 32.7  RDW 17.0* 17.0* 16.5*  PLT 218 209 238    Cardiac EnzymesNo results for input(s): TROPONINI in the last 168 hours. No results for input(s): TROPIPOC in the last 168 hours.   BNPNo results for input(s): BNP, PROBNP in the last 168 hours.   DDimer No results for input(s): DDIMER in the last 168 hours.   Radiology    Ct Head Wo Contrast  Result Date: 11/25/2018 CLINICAL DATA:  Change in mental status, recent stroke, follow-up, history diabetes mellitus, hypertension, coronary artery disease post MI, former smoker EXAM: CT HEAD WITHOUT CONTRAST TECHNIQUE: Contiguous axial images were obtained from the base of the skull through the vertex without intravenous contrast. Sagittal and coronal MPR images reconstructed from axial data set. COMPARISON:  CT head 11/20/2018 Correlation: MR brain 11/21/2018 FINDINGS: Brain: Normal ventricular morphology. Generalized atrophy. Mild RIGHT to LEFT midline shift of 3 mm. Subacute evolving large RIGHT MCA territory infarct, distribution corresponding to area positivity on diffusion-weighted imaging on prior MR. No  hemorrhagic transformation. Old LEFT occipital infarct. Small vessel chronic ischemic changes of deep cerebral white matter. No intracranial hemorrhage, mass lesion, or new area of infarction. No extra-axial fluid collections. Vascular: No definite hyperdense vessels. Atherosclerotic calcifications of internal carotid arteries at skull base. Skull: Intact Sinuses/Orbits: Clear Other: N/A IMPRESSION: Subacute large RIGHT MCA territory infarct with 3 mm of RIGHT to LEFT midline shift, new since 11/20/2018 but consistent with large subacute infarct. Underlying atrophy with small vessel chronic ischemic changes of deep cerebral white matter. No additional new intracranial abnormalities. Electronically Signed   By: Lavonia Dana M.D.   On: 11/25/2018 13:35   Dg Chest Port 1 View  Result Date: 11/25/2018 CLINICAL DATA:  SOB EXAM: PORTABLE CHEST 1 VIEW COMPARISON:  Chest radiograph dated 11/21/2018, 11/20/2018 FINDINGS: Patient rotation somewhat limits evaluation. Stable cardiomediastinal contours with enlarged heart size. Aortic arch calcifications. Interval extubation and placement of an enteric tube coursing below the diaphragm with the distal tip out of field of view. No new focal infiltrate identified. Lungs are mildly hyperinflated. Bilateral increased bronchial markings are similar to prior. No large pleural effusion. Small area of lucency at the right lung base is likely artifactual. Visualized skeleton is unremarkable. IMPRESSION: 1. Interval extubation and placement of an enteric tube coursing below the diaphragm with the distal tip out of field of view. 2. Chronic bilateral increased bronchial markings likely chronic bronchitis. No definite new focal infiltrate. 3. Small area of lucency at the right lung base is likely artifactual. Consider repeat radiograph, preferably PA and lateral radiographs, to exclude small pneumothorax. Electronically Signed   By: Audie Pinto M.D.   On: 11/25/2018 13:39     Cardiac Studies   ECHO:   1. Akinesis of the left ventricular, entire inferior wall and inferolateral wall.  2. The left ventricle has moderately reduced systolic function, with an ejection fraction of 35-40%. The cavity size was normal. There is mild concentric left ventricular hypertrophy. Left ventricular diastolic Doppler parameters are consistent with  pseudonormalization. Elevated mean left atrial pressure.  3. The  right ventricle has normal systolic function. The cavity was normal. There is no increase in right ventricular wall thickness. Right ventricular systolic pressure could not be assessed.  4. Left atrial size was mildly dilated.  5. There is mild mitral annular calcification present.  6. The aorta is normal unless otherwise noted.  Patient Profile     78 y.o. female with a hx of CAD, MI 20 years ago, cardiomyopathy with EF of 30-35% by echo, bilateral carotid artery stenosis with left ICA occlsuion and right carotid endarterectomy (2008),CVA, HTN, HLD, DM, and mitral valve insufficiencywho is being seen for the evaluation of atrial fibrillation.She saw vascular surgery in 2018 for carotid artery stenosis. She has complete occlusion of her left ICA and 40-59% stenosis in the R ICA. No intervention at that time. 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 Notes reveal an echocardiogram with an EF of 30%, unknown if this is ischemic in nature. Per son she was on anticoagulation for a while but does not know when it was discontinued. He states that she has been fully active taking care of her grand daughter, driving,cooking and full independent until Monday when he found her down laying in her bed, she presented to [facility] with left-sided neglect and eye deviation to the right. CT head with R M1 occlusion and family decided to proceed with thrombectomy/TPA. She was hypertensive and tachycardic. She was intubated prior to the procedure. Unfortunately, revascularization  was unsuccessful.  Assessment & Plan    ATRIAL FIB WITH RVR:   Stopped IV Cardizem yesterday.   Started VT beta blocker and VT Cardizem.  NSR with ectopy.  OK to consolidate beta blocker to bid and continue current tid Cardizem.  Convert to Eliquis when OK with neurology.   CARDIOMYOPATHY:  Appears to be euvolemic.  Meds managed as above.  .     For questions or updates, please contact [place] Please consult [email or web address] for contact info under Cardiology/STEMI.   Signed, Minus Breeding, MD  11/26/2018, 9:48 AM

## 2018-11-26 NOTE — Plan of Care (Signed)
  Problem: Education: Goal: Knowledge of disease or condition will improve Outcome: Progressing Goal: Knowledge of secondary prevention will improve Outcome: Progressing Goal: Knowledge of patient specific risk factors addressed and post discharge goals established will improve Outcome: Progressing   Problem: Coping: Goal: Will verbalize positive feelings about self Outcome: Progressing Goal: Will identify appropriate support needs Outcome: Progressing   Problem: Health Behavior/Discharge Planning: Goal: Ability to manage health-related needs will improve Outcome: Progressing   Problem: Self-Care: Goal: Ability to participate in self-care as condition permits will improve Outcome: Progressing   Problem: Ischemic Stroke/TIA Tissue Perfusion: Goal: Complications of ischemic stroke/TIA will be minimized Outcome: Progressing   Ival Bible, BSN, RN

## 2018-11-26 NOTE — Progress Notes (Signed)
CSW attempted to contact the patient's son, Jewelie Boyster at [phone] and that is not his number any longer. CSW attempted to contact at the second number listed for him. CSW left a HIPPA compliant voicemail requesting a return phone call.   CSW will continue to follow.   Domenic Schwab, MSW, [place] [facility]  [phone]

## 2018-11-26 NOTE — Progress Notes (Addendum)
[name] for Heparin >> Apixaban Indication: atrial fibrillation and stroke  No Known Allergies  Patient Measurements: Height: 5\' 1"  (154.9 cm) Weight: 104 lb 11.5 oz (47.5 kg) IBW/kg (Calculated) : 47.8 Heparin Dosing Weight: 46kg  Vital Signs: Temp: 98 F (36.7 C) (09/06 0752) Temp Source: Oral (09/06 0752) BP: 139/93 (09/06 0752) Pulse Rate: 76 (09/06 0610)  Labs: Recent Labs    11/24/18 0159  11/25/18 0446 11/25/18 0648 11/25/18 2047 11/26/18 0503  HGB 9.9*  --  10.0*  --   --  10.4*  HCT 30.8*  --  32.4*  --   --  31.8*  PLT 218  --  209  --   --  238  HEPARINUNFRC 0.43   < >  --  0.21* 0.24* 0.45  CREATININE 1.01*  --  0.97  --   --  0.91   < > = values in this interval not displayed.    Estimated Creatinine Clearance: 39.4 mL/min (by C-G formula based on SCr of 0.91 mg/dL).   Assessment: 78 year old female with new afib. New cva, was given tpa on 8/31.The patient was started on Heparin initially - now with plans to transition to Apixaban. New orders to start IV heparin, will aim for low end of goal and avoid bolusing given recent stroke requiring tpa  Heparin level this morning resulted as therapeutic after a rate increase yesterday evening (HL 0.45 << 0.24, goal of 0.3-0.5). CBC stable - no overt bleeding noted.   Goal of Therapy:  Heparin level 0.3-0.5 units/ml Monitor platelets by anticoagulation protocol: Yes   Plan:  - Continue Heparin at 950 units/hr (9.5 ml/hr) - Will continue to monitor for any signs/symptoms of bleeding and will follow up with heparin level in 8 hours to confirm therapeutic  Thank you for allowing pharmacy to be a part of this patient's care.  Alycia Rossetti, PharmD, BCPS Clinical Pharmacist Clinical phone for 11/26/2018: [phone] 11/26/2018 10:14 AM   **Pharmacist phone directory can now be found on [email or web address] ([email or web address]).  Listed under [place].     ---------------------------------------------------------------------------------------------------------- Addendum:   Plans are now to transition the patient to Apixaban. Given age<80, SCr<1.5 - will initiate 5 mg bid dose.   Plan - D/c heparin - Start Apixaban 5 mg bid - Will plan to educate prior to discharge - Will sign off consult but continue to monitor peripherally  Alycia Rossetti, PharmD, BCPS Clinical Pharmacist 11/26/2018 11:59 AM

## 2018-11-27 DIAGNOSIS — I5022 Chronic systolic (congestive) heart failure: Secondary | ICD-10-CM

## 2018-11-27 LAB — BASIC METABOLIC PANEL
Anion gap: 10 (ref 5–15)
BUN: 17 mg/dL (ref 8–23)
CO2: 25 mmol/L (ref 22–32)
Calcium: 8.5 mg/dL — ABNORMAL LOW (ref 8.9–10.3)
Chloride: 103 mmol/L (ref 98–111)
Creatinine, Ser: 0.94 mg/dL (ref 0.44–1.00)
GFR calc Af Amer: >60 mL/min (ref >60)
GFR calc non Af Amer: 59 mL/min — ABNORMAL LOW (ref >60)
Glucose, Bld: 180 mg/dL — ABNORMAL HIGH (ref 70–99)
Potassium: 4.3 mmol/L (ref 3.5–5.1)
Sodium: 138 mmol/L (ref 135–145)

## 2018-11-27 LAB — CBC
HCT: 31.3 % — ABNORMAL LOW (ref 36.0–46.0)
Hemoglobin: 10.1 g/dL — ABNORMAL LOW (ref 12.0–15.0)
MCH: 28.4 pg (ref 26.0–34.0)
MCHC: 32.3 g/dL (ref 30.0–36.0)
MCV: 87.9 fL (ref 80.0–100.0)
Platelets: 251 10*3/uL (ref 150–400)
RBC: 3.56 MIL/uL — ABNORMAL LOW (ref 3.87–5.11)
RDW: 16.8 % — ABNORMAL HIGH (ref 11.5–15.5)
WBC: 6.8 10*3/uL (ref 4.0–10.5)
nRBC: 0 % (ref 0.0–0.2)

## 2018-11-27 LAB — GLUCOSE, CAPILLARY
Glucose-Capillary: 179 mg/dL — ABNORMAL HIGH (ref 70–99)
Glucose-Capillary: 204 mg/dL — ABNORMAL HIGH (ref 70–99)
Glucose-Capillary: 191 mg/dL — ABNORMAL HIGH (ref 70–99)
Glucose-Capillary: 171 mg/dL — ABNORMAL HIGH (ref 70–99)
Glucose-Capillary: 145 mg/dL — ABNORMAL HIGH (ref 70–99)
Glucose-Capillary: 174 mg/dL — ABNORMAL HIGH (ref 70–99)

## 2018-11-27 LAB — MAGNESIUM: Magnesium: 1.8 mg/dL (ref 1.7–2.4)

## 2018-11-27 MED ORDER — METFORMIN HCL 500 MG PO TABS
500.0000 mg | ORAL_TABLET | Freq: Two times a day (BID) | ORAL | Status: DC
  Administered 2018-11-27 – 2018-11-28 (×3): 500 mg via ORAL
  Filled 2018-11-27 (×3): qty 1

## 2018-11-27 MED ORDER — PANTOPRAZOLE SODIUM 40 MG PO PACK
40.0000 mg | PACK | Freq: Every day | ORAL | Status: DC
  Administered 2018-11-27 – 2018-12-01 (×5): 40 mg
  Filled 2018-11-27 (×6): qty 20

## 2018-11-27 MED ORDER — SIMVASTATIN 20 MG PO TABS
20.0000 mg | ORAL_TABLET | Freq: Every day | ORAL | Status: DC
  Administered 2018-11-27 – 2018-11-28 (×2): 20 mg via ORAL
  Filled 2018-11-27 (×2): qty 1

## 2018-11-27 MED ORDER — METOPROLOL TARTRATE 25 MG/10 ML ORAL SUSPENSION
37.5000 mg | Freq: Two times a day (BID) | ORAL | Status: DC
  Administered 2018-11-27 (×2): 37.5 mg
  Filled 2018-11-27 (×3): qty 15

## 2018-11-27 NOTE — Progress Notes (Signed)
Progress Note  Patient Name: [name] Date of Encounter: 11/27/2018  Primary Cardiologist:   No primary care provider on file.   Subjective   No issues overnight. Maintaining sinus rhythm but noted to have brief run of SVT overnight. Started on Eliquis yesterday.  Inpatient Medications    Scheduled Meds:  apixaban  5 mg Per Tube BID   aspirin  81 mg Per Tube Daily   chlorhexidine  15 mL Mouth Rinse BID   Chlorhexidine Gluconate Cloth  6 each Topical Daily   diltiazem  30 mg Per Tube Q8H   free water  200 mL Per Tube Q4H   hydrochlorothiazide  12.5 mg Oral Daily   insulin aspart  1-3 Units Subcutaneous Q4H   irbesartan  300 mg Per Tube Daily   mouth rinse  15 mL Mouth Rinse q12n4p   metoprolol tartrate  25 mg Per Tube BID   pantoprazole (PROTONIX) IV  40 mg Intravenous QHS   sennosides  5 mL Per Tube QHS   Continuous Infusions:  feeding supplement (OSMOLITE 1.2 CAL) Stopped (11/25/18 1300)   PRN Meds: acetaminophen, dicyclomine, senna-docusate   Vital Signs    Vitals:   11/26/18 2156 11/26/18 2325 11/27/18 0335 11/27/18 0408  BP: 139/74 (!) 113/53 (!) 140/55 136/64  Pulse: 83 (!) 50 60 66  Resp:  18 18   Temp:  97.8 F (36.6 C) 98 F (36.7 C)   TempSrc:  Oral Oral   SpO2:  100% 100%   Weight:   47.6 kg   Height:        Intake/Output Summary (Last 24 hours) at 11/27/2018 0745 Last data filed at 11/27/2018 0408 Gross per 24 hour  Intake 1458 ml  Output 1480 ml  Net -22 ml   Filed Weights   11/25/18 0500 11/26/18 0349 11/27/18 0335  Weight: 47.6 kg 47.5 kg 47.6 kg    Telemetry    NSR with brief run of <10 beats of SVT overnight - Personally Reviewed  ECG    NA - Personally Reviewed  Physical Exam   General appearance: alert and no distress Neck: no carotid bruit, no JVD and thyroid not enlarged, symmetric, no tenderness/mass/nodules Lungs: clear to auscultation bilaterally Heart: regular rate and rhythm Abdomen: soft,  non-tender; bowel sounds normal; no masses,  no organomegaly and scaphoid Extremities: extremities normal, atraumatic, no cyanosis or edema Pulses: 2+ and symmetric Skin: Skin color, texture, turgor normal. No rashes or lesions Neurologic: Mental status: awake, confused, hemiparetic Psych: Not assessed     Labs    Chemistry Recent Labs  Lab 11/20/18 1518  11/24/18 0159 11/25/18 0446 11/26/18 0503  NA 140   < > 145 139 139  K 3.9   < > 3.6 3.5 3.1*  CL 106   < > 115* 107 103  CO2 20*   < > 22 19* 25  GLUCOSE 167*   < > 149* 211* 188*  BUN 20   < > 22 19 16   CREATININE 1.06*   < > 1.01* 0.97 0.91  CALCIUM 9.2   < > 8.7* 8.3* 8.5*  PROT 6.7  --   --   --   --   ALBUMIN 3.8  --   --   --   --   AST 19  --   --   --   --   ALT 32  --   --   --   --   ALKPHOS 66  --   --   --   --  BILITOT 0.9  --   --   --   --   GFRNONAA 51*   < > 54* 57* >60  GFRAA 59*   < > >60 >60 >60  ANIONGAP 14   < > 8 13 11    < > = values in this interval not displayed.     Hematology Recent Labs  Lab 11/25/18 0446 11/26/18 0503 11/27/18 0704  WBC 7.1 6.4 6.8  RBC 3.61* 3.71* 3.56*  HGB 10.0* 10.4* 10.1*  HCT 32.4* 31.8* 31.3*  MCV 89.8 85.7 87.9  MCH 27.7 28.0 28.4  MCHC 30.9 32.7 32.3  RDW 17.0* 16.5* 16.8*  PLT 209 238 251    Cardiac EnzymesNo results for input(s): TROPONINI in the last 168 hours. No results for input(s): TROPIPOC in the last 168 hours.   BNPNo results for input(s): BNP, PROBNP in the last 168 hours.   DDimer No results for input(s): DDIMER in the last 168 hours.   Radiology    Ct Head Wo Contrast  Result Date: 11/25/2018 CLINICAL DATA:  Change in mental status, recent stroke, follow-up, history diabetes mellitus, hypertension, coronary artery disease post MI, former smoker EXAM: CT HEAD WITHOUT CONTRAST TECHNIQUE: Contiguous axial images were obtained from the base of the skull through the vertex without intravenous contrast. Sagittal and coronal MPR images  reconstructed from axial data set. COMPARISON:  CT head 11/20/2018 Correlation: MR brain 11/21/2018 FINDINGS: Brain: Normal ventricular morphology. Generalized atrophy. Mild RIGHT to LEFT midline shift of 3 mm. Subacute evolving large RIGHT MCA territory infarct, distribution corresponding to area positivity on diffusion-weighted imaging on prior MR. No hemorrhagic transformation. Old LEFT occipital infarct. Small vessel chronic ischemic changes of deep cerebral white matter. No intracranial hemorrhage, mass lesion, or new area of infarction. No extra-axial fluid collections. Vascular: No definite hyperdense vessels. Atherosclerotic calcifications of internal carotid arteries at skull base. Skull: Intact Sinuses/Orbits: Clear Other: N/A IMPRESSION: Subacute large RIGHT MCA territory infarct with 3 mm of RIGHT to LEFT midline shift, new since 11/20/2018 but consistent with large subacute infarct. Underlying atrophy with small vessel chronic ischemic changes of deep cerebral white matter. No additional new intracranial abnormalities. Electronically Signed   By: Lavonia Dana M.D.   On: 11/25/2018 13:35   Dg Chest Port 1 View  Result Date: 11/25/2018 CLINICAL DATA:  SOB EXAM: PORTABLE CHEST 1 VIEW COMPARISON:  Chest radiograph dated 11/21/2018, 11/20/2018 FINDINGS: Patient rotation somewhat limits evaluation. Stable cardiomediastinal contours with enlarged heart size. Aortic arch calcifications. Interval extubation and placement of an enteric tube coursing below the diaphragm with the distal tip out of field of view. No new focal infiltrate identified. Lungs are mildly hyperinflated. Bilateral increased bronchial markings are similar to prior. No large pleural effusion. Small area of lucency at the right lung base is likely artifactual. Visualized skeleton is unremarkable. IMPRESSION: 1. Interval extubation and placement of an enteric tube coursing below the diaphragm with the distal tip out of field of view. 2.  Chronic bilateral increased bronchial markings likely chronic bronchitis. No definite new focal infiltrate. 3. Small area of lucency at the right lung base is likely artifactual. Consider repeat radiograph, preferably PA and lateral radiographs, to exclude small pneumothorax. Electronically Signed   By: Audie Pinto M.D.   On: 11/25/2018 13:39    Cardiac Studies   ECHO:   1. Akinesis of the left ventricular, entire inferior wall and inferolateral wall.  2. The left ventricle has moderately reduced systolic function, with an ejection fraction of  35-40%. The cavity size was normal. There is mild concentric left ventricular hypertrophy. Left ventricular diastolic Doppler parameters are consistent with  pseudonormalization. Elevated mean left atrial pressure.  3. The right ventricle has normal systolic function. The cavity was normal. There is no increase in right ventricular wall thickness. Right ventricular systolic pressure could not be assessed.  4. Left atrial size was mildly dilated.  5. There is mild mitral annular calcification present.  6. The aorta is normal unless otherwise noted.  Patient Profile     78 y.o. female with a hx of CAD, MI 20 years ago, cardiomyopathy with EF of 30-35% by echo, bilateral carotid artery stenosis with left ICA occlsuion and right carotid endarterectomy (2008),CVA, HTN, HLD, DM, and mitral valve insufficiencywho is being seen for the evaluation of atrial fibrillation.She saw vascular surgery in 2018 for carotid artery stenosis. She has complete occlusion of her left ICA and 40-59% stenosis in the R ICA. No intervention at that time. 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 Notes reveal an echocardiogram with an EF of 30%, unknown if this is ischemic in nature. Per son she was on anticoagulation for a while but does not know when it was discontinued. He states that she has been fully active taking care of her grand daughter,  driving,cooking and full independent until Monday when he found her down laying in her bed, she presented to [facility] with left-sided neglect and eye deviation to the right. CT head with R M1 occlusion and family decided to proceed with thrombectomy/TPA. She was hypertensive and tachycardic. She was intubated prior to the procedure. Unfortunately, revascularization was unsuccessful.  Assessment & Plan    ATRIAL FIB WITH RVR:   Maintaining sinus rhythm with short run of PSVT noted overnight.  - Now anticoagulated on Eliquis  - Switch to metoprolol oral suspension for easier administration and better bioavailability, increase dose to 37.5 mg BID  CARDIOMYOPATHY:  Appears to be euvolemic.  Meds managed as above.    -  She is recorded net even overnight, appears euvolemic  For questions or updates, please contact [place] Please consult [email or web address] for contact info under Cardiology/STEMI.   Pixie Casino, MD, [facility], [place] Director of the [facility] Lipid Disorders &  Cardiovascular Risk Reduction [facility] Diplomate of the American Board of Clinical Lipidology Attending Cardiologist  Direct Dial: [phone]   Fax: [phone]  Website:  [email or web address]  Pixie Casino, MD  11/27/2018, 7:45 AM

## 2018-11-27 NOTE — Plan of Care (Signed)
  Problem: Education: Goal: Knowledge of disease or condition will improve Outcome: Progressing Goal: Knowledge of secondary prevention will improve Outcome: Progressing Goal: Knowledge of patient specific risk factors addressed and post discharge goals established will improve Outcome: Progressing   Problem: Coping: Goal: Will verbalize positive feelings about self Outcome: Progressing Goal: Will identify appropriate support needs Outcome: Progressing   Problem: Health Behavior/Discharge Planning: Goal: Ability to manage health-related needs will improve Outcome: Progressing   Problem: Self-Care: Goal: Ability to participate in self-care as condition permits will improve Outcome: Progressing   Problem: Ischemic Stroke/TIA Tissue Perfusion: Goal: Complications of ischemic stroke/TIA will be minimized Outcome: Progressing   Ival Bible, BSN, RN

## 2018-11-27 NOTE — Progress Notes (Signed)
Physical Therapy Treatment Patient Details Name: [name] MRN: [id number] DOB: 1941/02/28 Today's Date: 11/27/2018    History of Present Illness (P) 78 yo female admitted with R cerebral infarct frontoparietal infarect with failed attempt to revascularization and extubated 9/2 PMH:hx of CAD, MI 20 years ago [facility]?), cardiomyopathy with EF of 30-35% by echo, bilateral carotid artery stenosis with left ICA occlsuion and right carotid endarterectomy (2008), CVA (?2015), HTN, HLD, DM, and mitral valve insufficiency      PT Comments    Pt tolerated bed mobility & bed>recliner transfer on this date but requires total +2 assist overall as pt is limited by impaired cognition & awareness and demonstrates significant R gaze preference with eyes remaining to R despite turning head to midline/L. Pt demonstrates significantly impaired sitting balance and pushing L with RUE despite cuing and addressing midline orientation during session. Current plan of SNF remains appropriate as pt requires significant assistance with all mobility. Pt would benefit from acute PT services until d/c to focus on sitting balance, midline orientation, transfers, and activity tolerance.     Follow Up Recommendations  SNF;Supervision for mobility/OOB;Supervision/Assistance - 24 hour     Equipment Recommendations  Other (comment)(TBD in next venue)    Recommendations for Other Services       Precautions / Restrictions Precautions Precautions: (P) Fall Precaution Comments: (P) L hemi, R gaze preference Restrictions Weight Bearing Restrictions: No    Mobility  Bed Mobility Overal bed mobility: Needs Assistance Bed Mobility: Supine to Sit     Supine to sit: Total assist;+2 for physical assistance;HOB elevated     General bed mobility comments: pt with little initiation & participation in supine>sitting with bed rails, HOB elevated  Transfers Overall transfer level: Needs assistance   Transfers: Squat Pivot  Transfers     Squat pivot transfers: +2 physical assistance;Total assist     General transfer comment: requires +2 assist to pivot bed>drop arm recliner with use of gait belt & pad, blocking L knee  Ambulation/Gait                 Stairs             Wheelchair Mobility    Modified Rankin (Stroke Patients Only) Modified Rankin (Stroke Patients Only) Pre-Morbid Rankin Score: Slight disability Modified Rankin: Severe disability     Balance Overall balance assessment: Needs assistance Sitting-balance support: Single extremity supported;Feet supported Sitting balance-Leahy Scale: Poor Sitting balance - Comments: pt pushing with RUE with therapist cuing her to place RUE on knee, pt with strong R/posterior lean Postural control: Right lateral lean;Posterior lean                                  Cognition Arousal/Alertness: (P) Awake/alert Behavior During Therapy: (P) Flat affect Overall Cognitive Status: (P) Impaired/Different from baseline Area of Impairment: (P) Attention;Memory;Awareness;Following commands                   Current Attention Level: (P) Sustained Memory: (P) Decreased recall of precautions;Decreased short-term memory Following Commands: (P) Follows one step commands with increased time Safety/Judgement: (P) Decreased awareness of safety;Decreased awareness of deficits Awareness: (P) Intellectual Problem Solving: (P) Slow processing;Decreased initiation;Difficulty sequencing;Requires verbal cues;Requires tactile cues General Comments: (P) pt with strong R gaze preference and lacks awareness to L side of her world. pt continues to ask for [name] or "Karoline Caldwell" which is Grants name the grandchildren call  him. PT no recall of information just provided to patient        Exercises      General Comments General comments (skin integrity, edema, etc.): VSS, updated son [name] re: pt's performance during session      Pertinent  Vitals/Pain Pain Assessment: (P) No/denies pain Pain Score: 0-No pain Faces Pain Scale: No hurt    Home Living                      Prior Function            PT Goals (current goals can now be found in the care plan section) Acute Rehab PT Goals Patient Stated Goal: to see Mariah PT Goal Formulation: With patient Time For Goal Achievement: 12/08/18 Potential to Achieve Goals: Fair Progress towards PT goals: Progressing toward goals    Frequency    Min 3X/week      PT Plan Current plan remains appropriate    Co-evaluation PT/OT/SLP Co-Evaluation/Treatment: Yes Reason for Co-Treatment: (P) Complexity of the patient's impairments (multi-system involvement);Necessary to address cognition/behavior during functional activity;For patient/therapist safety;To address functional/ADL transfers PT goals addressed during session: Mobility/safety with mobility;Balance OT goals addressed during session: (P) ADL's and self-care;Proper use of Adaptive equipment and DME;Strengthening/ROM      AM-PAC PT "6 Clicks" Mobility   Outcome Measure  Help needed turning from your back to your side while in a flat bed without using bedrails?: Total Help needed moving from lying on your back to sitting on the side of a flat bed without using bedrails?: Total Help needed moving to and from a bed to a chair (including a wheelchair)?: Total Help needed standing up from a chair using your arms (e.g., wheelchair or bedside chair)?: Total Help needed to walk in hospital room?: Total Help needed climbing 3-5 steps with a railing? : Total 6 Click Score: 6    End of Session Equipment Utilized During Treatment: Gait belt Activity Tolerance: Patient tolerated treatment well Patient left: in chair;with call bell/phone within reach;with family/visitor present(chair pad under pt but no green box to plug in alarm & RN made aware) Nurse Communication: Mobility status PT Visit Diagnosis: Hemiplegia and  hemiparesis;Difficulty in walking, not elsewhere classified (R26.2) Hemiplegia - Right/Left: Left Hemiplegia - dominant/non-dominant: Non-dominant     Time: [id number] PT Time Calculation (min) (ACUTE ONLY): 23 min  Charges:  $Neuromuscular Re-education: 8-22 mins                         Waunita Schooner, PT, DPT 11/27/2018, 9:47 AM

## 2018-11-27 NOTE — Progress Notes (Signed)
The patient is receiving Protonix by the intravenous route.  Based on criteria approved by the Pharmacy and [facility], the medication is being converted to the equivalent per tube dose form, protonix suspension 40 mg per tube at bedtime daily.  These criteria include: -No active GI bleeding -Able to tolerate diet of full liquids (or better) or tube feeding -Able to tolerate other medications by the oral or enteral route  If you have any questions about this conversion, please contact the Pharmacy Department (phone [phone]).  Thank you,  Nicole Cella, RPh Clinical Pharmacist [phone] Please check AMION for all [facility] phone numbers After 10:00 PM, call [facility] [phone] 11/27/2018 8:27 AM

## 2018-11-27 NOTE — Progress Notes (Signed)
Occupational Therapy Treatment Patient Details Name: [name] MRN: [id number] DOB: 22-Mar-1941 Today's Date: 11/27/2018    History of present illness 78 yo female admitted with R cerebral infarct frontoparietal infarect with failed attempt to revascularization and extubated 9/2 PMH:hx of CAD, MI [facility] years ago [facility]?), cardiomyopathy with EF of 30-35% by echo, bilateral carotid artery stenosis with left ICA occlsuion and right carotid endarterectomy (2008), CVA (?2015), HTN, HLD, DM, and mitral valve insufficiency     OT comments  Pt progressed to chair this session total +2 max (A) toward her R side. Pt initiated due to visual attention to the R. Pt requires (A) for balance and prevent pushing with R UE. Pt asking for son and educated that he went to get food and then asking again due to no recall of information. Recommend Palliative consult at this time. Pt continues to be most appropriate for SNF level care.   Follow Up Recommendations  SNF    Equipment Recommendations  3 in 1 bedside commode;Wheelchair (measurements OT);Wheelchair cushion (measurements OT);Hospital bed    Recommendations for Other Services      Precautions / Restrictions Precautions Precautions: Fall Precaution Comments: L hemi, R gaze preference Restrictions Weight Bearing Restrictions: No       Mobility Bed Mobility Overal bed mobility: Needs Assistance Bed Mobility: Supine to Sit     Supine to sit: Max assist;+2 for physical assistance     General bed mobility comments: pt initiates movement to EOB but requires total +2 MAx (A) to sit EOB. Pt with knee extension at EOB initially and requires total +2 max (A) to reposition to safe sitting position. pt needs constant hand over hand to place R UE on knee to prevent pushing.   Transfers Overall transfer level: Needs assistance   Transfers: Squat Pivot Transfers     Squat pivot transfers: +2 physical assistance;Total assist     General transfer  comment: requires +2 assist to pivot bed>drop arm recliner with use of gait belt & pad, blocking L knee    Balance Overall balance assessment: Needs assistance Sitting-balance support: Single extremity supported;Feet supported Sitting balance-Leahy Scale: Poor Sitting balance - Comments: pt pushing with RUE with therapist cuing her to place RUE on knee, pt with strong R/posterior lean Postural control: Right lateral lean;Posterior lean Standing balance support: No upper extremity supported Standing balance-Leahy Scale: Poor                             ADL either performed or assessed with clinical judgement   ADL Overall ADL's : Needs assistance/impaired Eating/Feeding: NPO                                     General ADL Comments: total (A) for all adls. pt noted to push with R UE and flexing R LE at EOB to push off EOB . pt needs increased (A) at EOB this session due to pushing. pt with strong R neck rotation, trunk rotation and gaze preference     Vision   Vision Assessment?: Yes Eye Alignment: Impaired (comment) Ocular Range of Motion: Impaired-to be further tested in functional context Alignment/Gaze Preference: Gaze right Tracking/Visual Pursuits: Unable to hold eye position out of midline Diplopia Assessment: Other (comment)(reports YES this session) Additional Comments: pt reports yes when asked about diplopia . pt with strong Gaze R and noted  to have dysconjugate eye alignment   Perception     Praxis      Cognition Arousal/Alertness: Awake/alert Behavior During Therapy: Flat affect Overall Cognitive Status: Impaired/Different from baseline Area of Impairment: Attention;Memory;Awareness;Following commands                   Current Attention Level: Sustained Memory: Decreased recall of precautions;Decreased short-term memory Following Commands: Follows one step commands with increased time Safety/Judgement: Decreased awareness of  safety;Decreased awareness of deficits Awareness: Intellectual Problem Solving: Slow processing;Decreased initiation;Difficulty sequencing;Requires verbal cues;Requires tactile cues General Comments: pt with strong R gaze preference and lacks awareness to L side of her world. pt continues to ask for [name] or "Karoline Caldwell" which is Grants name the grandchildren call him. PT no recall of information just provided to patient          Exercises     Shoulder Instructions       General Comments VSS, updated son [name] re: pt's performance during session    Pertinent Vitals/ Pain       Pain Assessment: No/denies pain Pain Score: 0-No pain Faces Pain Scale: No hurt  Home Living                                          Prior Functioning/Environment              Frequency  Min 2X/week        Progress Toward Goals  OT Goals(current goals can now be found in the care plan section)  Progress towards OT goals: Progressing toward goals  Acute Rehab OT Goals Patient Stated Goal: to see Mariah OT Goal Formulation: With patient Time For Goal Achievement: 12/08/18 Potential to Achieve Goals: Good ADL Goals Pt Will Perform Eating: with min assist;with adaptive utensils;sitting Pt Will Perform Grooming: with min assist;sitting;with adaptive equipment Additional ADL Goal #1: pt will complete bed mobility total +2 mod (A) as precursor to adls Additional ADL Goal #2: Pt will complete static sitting 5 minutes min A() as precursor to adls  Plan Discharge plan remains appropriate    Co-evaluation    PT/OT/SLP Co-Evaluation/Treatment: Yes Reason for Co-Treatment: Complexity of the patient's impairments (multi-system involvement);Necessary to address cognition/behavior during functional activity;For patient/therapist safety;To address functional/ADL transfers PT goals addressed during session: Mobility/safety with mobility;Balance OT goals addressed during session:  ADL's and self-care;Proper use of Adaptive equipment and DME;Strengthening/ROM      AM-PAC OT "6 Clicks" Daily Activity     Outcome Measure   Help from another person eating meals?: Total Help from another person taking care of personal grooming?: Total Help from another person toileting, which includes using toliet, bedpan, or urinal?: Total Help from another person bathing (including washing, rinsing, drying)?: Total Help from another person to put on and taking off regular upper body clothing?: Total Help from another person to put on and taking off regular lower body clothing?: Total 6 Click Score: 6    End of Session Equipment Utilized During Treatment: Gait belt  OT Visit Diagnosis: Unsteadiness on feet (R26.81);Muscle weakness (generalized) (M62.81)   Activity Tolerance Patient tolerated treatment well   Patient Left in chair;with call bell/phone within reach;with chair alarm set;with nursing/sitter in room(pt with pad placed but not call bell alarm to hook. RN aware)   Nurse Communication Precautions;Mobility status        Time: [id number] OT Time  Calculation (min): 27 min  Charges: OT General Charges $OT Visit: 1 Visit OT Treatments $Self Care/Home Management : 8-22 mins   Jeri Modena, OTR/L  Acute Rehabilitation Services Pager: [phone] Office: [phone] .    Jeri Modena 11/27/2018, 9:53 AM

## 2018-11-27 NOTE — Care Management Important Message (Signed)
Important Message  Patient Details  Name: [name] MRN: [id number] Date of Birth: 07/28/1940   Medicare Important Message Given:  Yes     Glema Takaki [address] 11/27/2018, 3:26 PM

## 2018-11-27 NOTE — Progress Notes (Signed)
STROKE TEAM PROGRESS NOTE   INTERVAL HISTORY Son and RN at bedside. Pt sitting in chair, more awake alert, still has right gaze, left hemiplegia. On TF, will do MBS tomorrow. Son would like to take pt home instead of go to SNF. I discussed with him and he will reconsider. Son is OK with PEG if needed.    Vitals:   11/26/18 2156 11/26/18 2325 11/27/18 0335 11/27/18 0408  BP: 139/74 (!) 113/53 (!) 140/55 136/64  Pulse: 83 (!) 50 60 66  Resp:  18 18   Temp:  97.8 F (36.6 C) 98 F (36.7 C)   TempSrc:  Oral Oral   SpO2:  100% 100%   Weight:   47.6 kg   Height:        CBC:  Recent Labs  Lab 11/20/18 1518  11/21/18 0538  11/26/18 0503 11/27/18 0704  WBC 6.5  --  6.2   < > 6.4 6.8  NEUTROABS 3.0  --  4.6  --   --   --   HGB 12.0   < > 9.2*   < > 10.4* 10.1*  HCT 38.0   < > 29.0*   < > 31.8* 31.3*  MCV 89.4  --  88.7   < > 85.7 87.9  PLT 234  --  224   < > 238 251   < > = values in this interval not displayed.    Basic Metabolic Panel:  Recent Labs  Lab 11/25/18 0446 11/26/18 0503 11/27/18 0704  NA 139 139 138  K 3.5 3.1* 4.3  CL 107 103 103  CO2 19* 25 25  GLUCOSE 211* 188* 180*  BUN 19 16 17   CREATININE 0.97 0.91 0.94  CALCIUM 8.3* 8.5* 8.5*  MG 1.9  --  1.8  PHOS 2.1* 3.0  --    Lipid Panel:     Component Value Date/Time   CHOL 124 11/21/2018 0538   TRIG 106 11/23/2018 0221   HDL 39 (L) 11/21/2018 0538   CHOLHDL 3.2 11/21/2018 0538   VLDL 13 11/21/2018 0538   LDLCALC 72 11/21/2018 0538   HgbA1c:  Lab Results  Component Value Date   HGBA1C 6.6 (H) 11/21/2018    IMAGING  Ct Head Code Stroke Wo Contrast 11/20/2018 1. Subtle hyperdense right MCA. There is occlusion of the right distal M1 on CTA today. 2. ASPECTS is 10 3. No acute infarct or hemorrhage   Ct Angio Head W Or Wo Contrast Ct Angio Neck W Or Wo Contrast 11/20/2018 1. Occlusion right M1 segment distally with poor flow in the right M2 and M3 branches. 2. Postop right carotid endarterectomy  2008. Right carotid bifurcation widely patent 3. Severe atherosclerotic disease left carotid bifurcation with occlusion of the left internal carotid artery. 4. Left anterior and left middle cerebral arteries appear patent without significant stenosis through collateral flow through the anterior communicating artery. 5. Posterior circulation intact    Cerebral Angiogram 11/20/2018 1.occluded Rt MCA distal M1 seg. Unsuccessful revascularization Despite x 2 passes with 27mm xc 32mm embotrap retriever device and penumbra aspiration  due to underlying hard ICAD. Unsuccessful intracranial rescue stenting dut vascular tortuosity.  Mr Brain Wo Contrast 11/21/2018 8 cm confluent region of infarction in the right middle cerebral artery territory affecting the insula and frontoparietal brain. Mild swelling but no evidence of hemorrhage at this time or of significant mass effect or any shift. Old infarctions within the right cerebellum, left occipital lobe and at the left frontoparietal  vertex.   Ct Head  Wo Contrast 11/25/2018 Subacute large RIGH T MCA territory infarct with 3 mm of RIGHT to LEFT midline shift, new since 11/20/2018 but consistent with large subacute infarct. Underlying atrophy with small vessel chronic ischemic changes of deep cerebral white matter. No additional new intracranial abnormalities.  Dg Chest Port 1 View 11/20/2018 1. The endotracheal tube is borderline low. Repositioning should be considered. 2. Cardiomegaly with mild vascular congestion.  11/21/2018 Endotracheal tube in satisfactory position. No acute abnormality noted.    2D Echocardiogram  1. Akinesis of the left ventricular, entire inferior wall and inferolateral wall.  2. The left ventricle has moderately reduced systolic function, with an ejection fraction of 35-40%. The cavity size was normal. There is mild concentric left ventricular hypertrophy. Left ventricular diastolic Doppler parameters are consistent with  pseudonormalization. Elevated mean left atrial pressure.  3. The right ventricle has normal systolic function. The cavity was normal. There is no increase in right ventricular wall thickness. Right ventricular systolic pressure could not be assessed.  4. Left atrial size was mildly dilated.  5. There is mild mitral annular calcification present.  6. The aorta is normal unless otherwise noted.  EEG - evidence of a right hemispheric dysfunction which is consistent with the patient's known right hemispheric infarct.   PHYSICAL EXAM    Elderly African-American lady awake alert in chair. Afebrile. Head is nontraumatic. Neck is supple without bruit.    Cardiac exam no murmur or gallop. Lungs with coarse rhonchi. Distal pulses are well felt.  Neurological Exam : Patient is awake alert sitting in chair, severe dysarthria but orientated to place, age, self and year, but not to month, followed most simple commands.  She has right gaze deviation.  She is not able to follow gaze to the left.  She blinks to threat inconsistently bilaterally.  Pupils equal reactive.  Fundi not visualized. left facial droop. cortrak tube in place. She does move right upper and lower extremity against gravity.  She withdraws left lower extremity mildly to pain.  Does not withdraw left upper extremity. Tone is diminished on the left and normal on the right.  Right plantars downgoing left is equivocal.  ASSESSMENT/PLAN Ms. [name] is a 78 y.o. female with history of myocardial infarction, hypertension, hyperlipidemia, diabetes, carotid artery occlusion on the left, CAD and arthritis presenting with L sided neglect, R eye deviation and L side flaccid. Received tPA 11/20/2018 at 1545. CTA showed R M1 occlusion. Taken to IR.  SBP > 220.   Stroke: R MCA insular/frontoparietal infarct due to right ICA and MCA occlusion s/p tPA and unsuccessful IR - embolic likely from new diagnosed AF  Code Stroke CT head subtle hyperdense R  MCA.     CTA head & neck R M1 Occlusion w/ poor flor R M2 and R M3. R CEA 2008 w/ patent bifurcation. Severe L ICA bifurcation atherosclerosis w/ occlusion L ICA.   Cerebral angio occluded R M1. Unsuccessful revascularization after 2 passes w/ embotrap and penumbra aspiration d/t underlying hard atherosclerosis and vascular tortuosity.  Post IR CT no ICH or mass effect  MRI R MCA infarct insular and frontoparietal. Old R cerebellar, L occipital, L frontoparietal vertex infarcts.  CT repeat 9/5 right large infarct with 33mm MLS, no hemorrhage  2D Echo EF 35-40%. LA mildly dilated. No source of embolus   LDL 72  HgbA1c 6.6   EEG 11/25/18 - evidence of a right hemispheric dysfunction which is consistent with the  patient's known right hemispheric infarct.  Eliquis for VTE prophylaxis  aspirin 81 mg daily and pletal prior to admission, now on aspirin 81mg  and eliquis.  Therapy recommendations:  SNF  Disposition:  pending  (lives alone, son checks on daily)  Acute Respiratory Failure w/ hypoxia  Intubated for IR, not extubated at the end of the procedure d/t tPA administration and sheath left in  Sheath now out  Extubated 9/2   Cheyne stokes breathing pattern 9520  Atrial Fibrillation w/ RVR, confirmed, new dx  telemetry monitoring showed AF    Treated with metoprolol  Cardiology consulted, appreciate help  On cardizem  On metoprolol 37.5mg  bid  On eliquis   Dysphagia  Secondary to stroke  Failed MBSS, repeat in am  On TF @ 50  On free water 200 Q4  SLP on board  Son is OK with PEG if needed  Carotid Disease  L ICA occlusion  Hx of right CEA 2008  Hypertensive Urgency  SBP > 220 in ED, as high as 222/128  Home meds:  norvasc 10, clonidine 0.1 bid, labetalol 200 bid, valsartan 320-12.5 daily  Treated w/ cleviprex, Now off.   Now on avapro, metoprolol, cardizem  BP stable  Long-term BP goal 130-150 given right ICA  occlusion  Hyperlipidemia  Home meds:  zocor 20  LDL 72, goal < 70  Resume zocor 20  Continue statin at discharge  Diabetes type II Controlled  Home meds:  metformin 500  HgbA1c 6.6, goal < 7.0  CBGs  SSI  DB RN on baord  Resume metformin 500 bid   Other Stroke Risk Factors  Advanced age  Former Cigarette smoker  Coronary artery disease s/p MI    Cardiomyopathy, euvolemic   Other Active Problems  R DP and PT not dopplerable prior to or post IR  Acute blood loss anemia 13.9->9.2->9.4->10.0  Urinary retention s/p multiple I&Os - on foley, UA neg  Hypokalemia - K 3.1-> supplement ->4.3    Hospital day # 7   Alison Hawking, MD PhD Stroke Neurology 11/27/2018 2:29 PM   To contact Stroke Continuity provider, please refer to [email or web address] After hours, contact General Neurology

## 2018-11-27 NOTE — Progress Notes (Addendum)
  Speech Language Pathology Treatment: Dysphagia  Patient Details Name: [name] MRN: [id number] DOB: 11-21-1940 Today's Date: 11/27/2018 Time: [id number] SLP Time Calculation (min) (ACUTE ONLY): 21 min  Assessment / Plan / Recommendation Clinical Impression  Pt was joined by her son in the room, demonstrated strong R field preference, could not get pt to attend to the L. Pts alertness fluctuated between wakefulness / drowsiness, stimuable with verbal/visual cues. Pt's speech was incoherent and she fixated on a distant spot, raising her R arm, and increased work of breathing, appeared to be due to anxiety. Oral care was provided to clear moderate residue build up and provide moisture as her tongue was dry and white in color. Given pudding, pt required tactile stimulation to maintain alertness but successfully initiated swallows. No s/sx aspiration noted. Given an ice chip pt produced a delayed cough and wet vocal quality. Difficulty maintaining alertness and possible altered mental status will affect her ability to meet her nutritional needs. Palliative care was discussed with pts son, he had not been aware and was open to learning more if she is unable to safely initiate a diet/liquids after instrumental study. Recommend possible MBS tomorrow if pt continues to initiate a swallow.      HPI HPI: [name] is a 78 y.o. F with PMH of CAD and MI, HTN, HL, cardiomyopathy, Type II DM, and bilateral carotid artery stenosis s/p right carotid endarterectomy in 2008. Pt was found by her son 8/31 in bed with flaccid L side, L facial droop and slurred speech; prior level of functioning was independent. MRI revealed R middle cerebral infarct affecting the insula and frontoparietal brain, CXR normal. Pt was intubated upon arrival on 8/31, extubated 9/1, and is currently on room air.       SLP Plan  Continue with current plan of care       Recommendations  Diet recommendations: NPO Medication  Administration: Via alternative means                Oral Care Recommendations: Oral care QID Follow up Recommendations: Skilled Nursing facility SLP Visit Diagnosis: Dysphagia, unspecified (R13.10) Plan: Continue with current plan of care                       [name] 11/27/2018, 9:17 AM

## 2018-11-28 ENCOUNTER — Encounter (HOSPITAL_COMMUNITY): Payer: Self-pay | Admitting: *Deleted

## 2018-11-28 ENCOUNTER — Other Ambulatory Visit: Payer: Self-pay

## 2018-11-28 ENCOUNTER — Inpatient Hospital Stay (HOSPITAL_COMMUNITY): Payer: Medicare Other

## 2018-11-28 DIAGNOSIS — I2510 Atherosclerotic heart disease of native coronary artery without angina pectoris: Secondary | ICD-10-CM | POA: Diagnosis present

## 2018-11-28 DIAGNOSIS — I471 Supraventricular tachycardia: Secondary | ICD-10-CM | POA: Diagnosis not present

## 2018-11-28 DIAGNOSIS — I255 Ischemic cardiomyopathy: Secondary | ICD-10-CM

## 2018-11-28 DIAGNOSIS — I160 Hypertensive urgency: Secondary | ICD-10-CM

## 2018-11-28 DIAGNOSIS — E785 Hyperlipidemia, unspecified: Secondary | ICD-10-CM | POA: Diagnosis present

## 2018-11-28 DIAGNOSIS — R339 Retention of urine, unspecified: Secondary | ICD-10-CM | POA: Diagnosis not present

## 2018-11-28 DIAGNOSIS — D62 Acute posthemorrhagic anemia: Secondary | ICD-10-CM | POA: Diagnosis not present

## 2018-11-28 DIAGNOSIS — I4891 Unspecified atrial fibrillation: Secondary | ICD-10-CM | POA: Diagnosis not present

## 2018-11-28 DIAGNOSIS — I429 Cardiomyopathy, unspecified: Secondary | ICD-10-CM

## 2018-11-28 DIAGNOSIS — R1310 Dysphagia, unspecified: Secondary | ICD-10-CM

## 2018-11-28 DIAGNOSIS — E876 Hypokalemia: Secondary | ICD-10-CM | POA: Diagnosis present

## 2018-11-28 DIAGNOSIS — R54 Age-related physical debility: Secondary | ICD-10-CM | POA: Diagnosis present

## 2018-11-28 LAB — GLUCOSE, CAPILLARY
Glucose-Capillary: 146 mg/dL — ABNORMAL HIGH (ref 70–99)
Glucose-Capillary: 151 mg/dL — ABNORMAL HIGH (ref 70–99)
Glucose-Capillary: 155 mg/dL — ABNORMAL HIGH (ref 70–99)
Glucose-Capillary: 162 mg/dL — ABNORMAL HIGH (ref 70–99)
Glucose-Capillary: 177 mg/dL — ABNORMAL HIGH (ref 70–99)
Glucose-Capillary: 178 mg/dL — ABNORMAL HIGH (ref 70–99)
Glucose-Capillary: 194 mg/dL — ABNORMAL HIGH (ref 70–99)

## 2018-11-28 LAB — BASIC METABOLIC PANEL
Anion gap: 12 (ref 5–15)
BUN: 18 mg/dL (ref 8–23)
CO2: 24 mmol/L (ref 22–32)
Calcium: 9.2 mg/dL (ref 8.9–10.3)
Chloride: 104 mmol/L (ref 98–111)
Creatinine, Ser: 0.87 mg/dL (ref 0.44–1.00)
GFR calc Af Amer: >60 mL/min (ref >60)
GFR calc non Af Amer: >60 mL/min (ref >60)
Glucose, Bld: 145 mg/dL — ABNORMAL HIGH (ref 70–99)
Potassium: 4.4 mmol/L (ref 3.5–5.1)
Sodium: 140 mmol/L (ref 135–145)

## 2018-11-28 LAB — CBC
HCT: 31.7 % — ABNORMAL LOW (ref 36.0–46.0)
Hemoglobin: 10.2 g/dL — ABNORMAL LOW (ref 12.0–15.0)
MCH: 27.7 pg (ref 26.0–34.0)
MCHC: 32.2 g/dL (ref 30.0–36.0)
MCV: 86.1 fL (ref 80.0–100.0)
Platelets: 271 10*3/uL (ref 150–400)
RBC: 3.68 MIL/uL — ABNORMAL LOW (ref 3.87–5.11)
RDW: 16.7 % — ABNORMAL HIGH (ref 11.5–15.5)
WBC: 7.7 10*3/uL (ref 4.0–10.5)
nRBC: 0 % (ref 0.0–0.2)

## 2018-11-28 MED ORDER — HEPARIN (PORCINE) 25000 UT/250ML-% IV SOLN
850.0000 [IU]/h | INTRAVENOUS | Status: AC
  Administered 2018-11-28: 900 [IU]/h via INTRAVENOUS
  Administered 2018-11-29: 950 [IU]/h via INTRAVENOUS
  Administered 2018-12-01: 850 [IU]/h via INTRAVENOUS
  Filled 2018-11-28 (×3): qty 250

## 2018-11-28 MED ORDER — CEFAZOLIN SODIUM-DEXTROSE 2-4 GM/100ML-% IV SOLN
2.0000 g | INTRAVENOUS | Status: AC
  Administered 2018-12-01: 2 g via INTRAVENOUS
  Filled 2018-11-28: qty 100

## 2018-11-28 MED ORDER — METOPROLOL TARTRATE 25 MG/10 ML ORAL SUSPENSION
50.0000 mg | Freq: Two times a day (BID) | ORAL | Status: DC
  Administered 2018-11-28 – 2018-12-02 (×9): 50 mg
  Filled 2018-11-28 (×10): qty 20

## 2018-11-28 MED ORDER — METFORMIN HCL 850 MG PO TABS
850.0000 mg | ORAL_TABLET | Freq: Two times a day (BID) | ORAL | Status: DC
  Administered 2018-11-29 (×2): 850 mg via ORAL
  Filled 2018-11-28 (×3): qty 1

## 2018-11-28 NOTE — Progress Notes (Signed)
Modified Barium Swallow Progress Note  Patient Details  Name: [name] MRN: [id number] Date of Birth: Mar 22, 1941  Today's Date: 11/28/2018  Modified Barium Swallow completed.  Full report located under Chart Review in the Imaging Section.  Brief recommendations include the following:  Clinical Impression  Pt was awake for study with periods of drowsiness requiring tactile stimulation intermittently. She was unable to manipulate, control or transit honey thick, puree or thin barium trials. Only one swallow was initiated throughout the study which appeared to be her saliva without barium/bolus. Barium remained on anterior tongue, anterior and left sulci and leaked from left side oral cavity despite multiple dry spoon presentations, tactile stimulation and verbal cueing. Barium was suctioned from oral cavity and cleaned with a toothette sponge. Her deconditioning has increased each day ST has worked with pt and overall prognosis is unknown. Palliative care may be helpful for decision making in regards to nutrition.      Swallow Evaluation Recommendations       SLP Diet Recommendations: NPO       Medication Administration: Via alternative means               Oral Care Recommendations: Oral care QID        Houston Siren 11/28/2018,2:25 PM   Orbie Pyo [place].Ed Risk analyst [phone] Office [phone]

## 2018-11-28 NOTE — Progress Notes (Signed)
Per CCMD, pt had 10 beats VT. Pt asymptomatic. Dr. Cheral Marker informed.

## 2018-11-28 NOTE — H&P (View-Only) (Signed)
Dearing [birth year]  [id number].    Requesting MD: Dr. Rosalin Hawking Chief Complaint/Reason for Consult: dysphagia  HPI:  This is a 78 yo black female with a history of CAD s/p MI with significant carotid artery disease, along with a previous CVA, DM, and HTN.  She apparently was on anticoagulation according to the son, but was taken off of this 3-4 months ago.  He is unclear why or what medication she was on. She was living by herself prior to this CVA and doing all things for herself.  She was admitted secondary to an embolic CVA now with left-sided neglect and preferential right gaze.  She has also been found to have a fib and has been placed on eliquis.  She now has significant dysphagia and a PEG tube has been requested.  ROS: ROS: Unable as the patient can speak a small amount but not able to answer many questions due to her CVA.  Family History  Problem Relation Age of Onset   Heart disease Mother    Diabetes Mother    Heart disease Brother    Diabetes Brother     Past Medical History:  Diagnosis Date   Arthritis    CAD (coronary artery disease)    Carotid artery occlusion    Diabetes mellitus    Hyperlipidemia    Hypertension    Myocardial infarction [facility])     Past Surgical History:  Procedure Laterality Date   CAROTID ENDARTERECTOMY  01/06/2007   right - with Dacron patch angioplasty   HERNIA REPAIR  2012   IR CT HEAD LTD  11/20/2018   IR PERCUTANEOUS ART THROMBECTOMY/INFUSION INTRACRANIAL INC DIAG ANGIO  11/20/2018   RADIOLOGY WITH ANESTHESIA N/A 11/20/2018   Procedure: CODE STROKE;  Surgeon: Luanne Bras, MD;  Location: [place];  Service: Radiology;  Laterality: N/A;    Social History:  reports that she quit smoking about 31 years ago. Her smoking use included cigarettes. She smoked 1.00 pack per day. She has never used smokeless tobacco. She reports that she does not drink alcohol. No history on file for drug.  Allergies: No Known  Allergies  Medications Prior to Admission  Medication Sig Dispense Refill   aspirin EC 81 MG tablet Take 81 mg by mouth daily.       cholecalciferol (VITAMIN D) 1000 UNITS tablet Take 2,000 Units by mouth daily.     cilostazol (PLETAL) 100 MG tablet Take 100 mg by mouth 2 (two) times daily.      cloNIDine (CATAPRES) 0.1 MG tablet Take 0.1 mg by mouth at bedtime.      dicyclomine (BENTYL) 10 MG capsule Take 10 mg by mouth 4 (four) times daily as needed for spasms.     furosemide (LASIX) 20 MG tablet Take 20 mg by mouth every other day.     labetalol (NORMODYNE) 200 MG tablet Take 400 mg by mouth 2 (two) times daily.      loratadine (CLARITIN) 10 MG tablet Take 10 mg by mouth Daily.     metFORMIN (GLUCOPHAGE) 500 MG tablet Take 500 mg by mouth 2 (two) times daily with a meal.     omeprazole (PRILOSEC) 20 MG capsule Take 20 mg by mouth daily.     Pancrelipase, Lip-Prot-Amyl, (ZENPEP) 40000-126000 units CPEP Take 1-2 capsules by mouth See admin instructions. Take 2 capsules by mouth three times daily with meals and 1 capsule as needed twice daily with snacks.     sacubitril-valsartan (ENTRESTO)  49-51 MG Take 1 tablet by mouth 2 (two) times daily.     simvastatin (ZOCOR) 20 MG tablet Take 20 mg by mouth at bedtime.         Physical Exam: Blood pressure (!) 141/68, pulse 67, temperature 98.8 F (37.1 C), temperature source Oral, resp. rate 12, height 5\' 1"  (1.549 m), weight 47.7 kg, SpO2 100 %. General: pleasant, skinny black female who is laying in bed in NAD HEENT: head is normocephalic, atraumatic.  Sclera are noninjected.  PERRL.  Ears and nose without any masses or lesions.  Mouth is pink and moist.  She has a definite right sided preferential gaze.  She has a Cortrak in her nose. Heart: regular, rate, and rhythm.  Normal s1,s2. No obvious murmurs, gallops, or rubs noted.  Palpable radial and pedal pulses bilaterally Lungs: CTAB, no wheezes, rhonchi, or rales noted.  Respiratory  effort nonlabored Abd: soft, NT, ND, +BS, no masses, hernias, or organomegaly.  Right inguinal hernia scar noted Neuro: does follow commands for most part.  Does not move LUE or LLE.  Squeezes my hand through her mitten on her right hand.  Good strength in her RUE and RLE. Skin: warm and dry with no masses, lesions, or rashes Psych: A&Ox3 but unable to hold a conversation due to CVA   Results for orders placed or performed during the hospital encounter of 11/20/18 (from the past 48 hour(s))  Glucose, capillary     Status: Abnormal   Collection Time: 11/26/18  4:30 PM  Result Value Ref Range   Glucose-Capillary 194 (H) 70 - 99 mg/dL  Glucose, capillary     Status: Abnormal   Collection Time: 11/26/18  7:56 PM  Result Value Ref Range   Glucose-Capillary 138 (H) 70 - 99 mg/dL  Glucose, capillary     Status: Abnormal   Collection Time: 11/26/18 11:34 PM  Result Value Ref Range   Glucose-Capillary 139 (H) 70 - 99 mg/dL  Glucose, capillary     Status: Abnormal   Collection Time: 11/27/18  3:33 AM  Result Value Ref Range   Glucose-Capillary 179 (H) 70 - 99 mg/dL  CBC     Status: Abnormal   Collection Time: 11/27/18  7:04 AM  Result Value Ref Range   WBC 6.8 4.0 - 10.5 K/uL   RBC 3.56 (L) 3.87 - 5.11 MIL/uL   Hemoglobin 10.1 (L) 12.0 - 15.0 g/dL   HCT 31.3 (L) 36.0 - 46.0 %   MCV 87.9 80.0 - 100.0 fL   MCH 28.4 26.0 - 34.0 pg   MCHC 32.3 30.0 - 36.0 g/dL   RDW 16.8 (H) 11.5 - 15.5 %   Platelets 251 150 - 400 K/uL   nRBC 0.0 0.0 - 0.2 %    Comment: Performed at [facility], [address], [place], [place] [id number]  Basic metabolic panel     Status: Abnormal   Collection Time: 11/27/18  7:04 AM  Result Value Ref Range   Sodium 138 135 - 145 mmol/L   Potassium 4.3 3.5 - 5.1 mmol/L   Chloride 103 98 - 111 mmol/L   CO2 25 22 - 32 mmol/L   Glucose, Bld 180 (H) 70 - 99 mg/dL   BUN 17 8 - 23 mg/dL   Creatinine, Ser 0.94 0.44 - 1.00 mg/dL   Calcium 8.5 (L) 8.9 - 10.3 mg/dL    GFR calc non Af Amer 59 (L) >60 mL/min   GFR calc Af Amer >60 >60  mL/min   Anion gap 10 5 - 15    Comment: Performed at [facility] [address], [place], [place] [address]  Magnesium     Status: None   Collection Time: 11/27/18  7:04 AM  Result Value Ref Range   Magnesium 1.8 1.7 - 2.4 mg/dL    Comment: Performed at [facility] [address], [place], [place] [address]  Glucose, capillary     Status: Abnormal   Collection Time: 11/27/18  9:16 AM  Result Value Ref Range   Glucose-Capillary 204 (H) 70 - 99 mg/dL  Glucose, capillary     Status: Abnormal   Collection Time: 11/27/18 11:28 AM  Result Value Ref Range   Glucose-Capillary 191 (H) 70 - 99 mg/dL  Glucose, capillary     Status: Abnormal   Collection Time: 11/27/18  5:40 PM  Result Value Ref Range   Glucose-Capillary 171 (H) 70 - 99 mg/dL  Glucose, capillary     Status: Abnormal   Collection Time: 11/27/18  8:29 PM  Result Value Ref Range   Glucose-Capillary 145 (H) 70 - 99 mg/dL  Glucose, capillary     Status: Abnormal   Collection Time: 11/27/18 11:16 PM  Result Value Ref Range   Glucose-Capillary 174 (H) 70 - 99 mg/dL  CBC     Status: Abnormal   Collection Time: 11/28/18  3:20 AM  Result Value Ref Range   WBC 7.7 4.0 - 10.5 K/uL   RBC 3.68 (L) 3.87 - 5.11 MIL/uL   Hemoglobin 10.2 (L) 12.0 - 15.0 g/dL   HCT 31.7 (L) 36.0 - 46.0 %   MCV 86.1 80.0 - 100.0 fL   MCH 27.7 26.0 - 34.0 pg   MCHC 32.2 30.0 - 36.0 g/dL   RDW 16.7 (H) 11.5 - 15.5 %   Platelets 271 150 - 400 K/uL   nRBC 0.0 0.0 - 0.2 %    Comment: Performed at [facility], [place], [place], [place] [id number]  Basic metabolic panel     Status: Abnormal   Collection Time: 11/28/18  3:20 AM  Result Value Ref Range   Sodium 140 135 - 145 mmol/L   Potassium 4.4 3.5 - 5.1 mmol/L   Chloride 104 98 - 111 mmol/L   CO2 24 22 - 32 mmol/L   Glucose, Bld 145 (H) 70 - 99 mg/dL   BUN 18 8 - 23 mg/dL   Creatinine, Ser 0.87 0.44 - 1.00  mg/dL   Calcium 9.2 8.9 - 10.3 mg/dL   GFR calc non Af Amer >60 >60 mL/min   GFR calc Af Amer >60 >60 mL/min   Anion gap 12 5 - 15    Comment: Performed at [facility], [place] [address], [place], [place] [address]  Glucose, capillary     Status: Abnormal   Collection Time: 11/28/18  4:30 AM  Result Value Ref Range   Glucose-Capillary 178 (H) 70 - 99 mg/dL  Glucose, capillary     Status: Abnormal   Collection Time: 11/28/18  5:24 AM  Result Value Ref Range   Glucose-Capillary 162 (H) 70 - 99 mg/dL  Glucose, capillary     Status: Abnormal   Collection Time: 11/28/18  8:04 AM  Result Value Ref Range   Glucose-Capillary 194 (H) 70 - 99 mg/dL   Comment 1 Notify RN    Comment 2 Document in Chart   Glucose, capillary     Status: Abnormal  Collection Time: 11/28/18 11:56 AM  Result Value Ref Range   Glucose-Capillary 177 (H) 70 - 99 mg/dL   Comment 1 Notify RN    Comment 2 Document in Chart    Dg Swallowing Func-speech Pathology  Result Date: 11/28/2018 Objective Swallowing Evaluation: Type of Study: MBS-Modified Barium Swallow Study  Patient Details Name: [name] MRN: [id number] Date of Birth: 1940/09/02 Today's Date: 11/28/2018 Time: SLP Start Time (ACUTE ONLY): 1215 -SLP Stop Time (ACUTE ONLY): [id number] SLP Time Calculation (min) (ACUTE ONLY): 24 min Past Medical History: Past Medical History: Diagnosis Date  Arthritis   CAD (coronary artery disease)   Carotid artery occlusion   Diabetes mellitus   Hyperlipidemia   Hypertension   Myocardial infarction [facility])  Past Surgical History: Past Surgical History: Procedure Laterality Date  CAROTID ENDARTERECTOMY  01/06/2007  right - with Dacron patch angioplasty  HERNIA REPAIR  2012  IR CT HEAD LTD  11/20/2018  IR PERCUTANEOUS ART THROMBECTOMY/INFUSION INTRACRANIAL INC DIAG ANGIO  11/20/2018  RADIOLOGY WITH ANESTHESIA N/A 11/20/2018  Procedure: CODE STROKE;  Surgeon: Luanne Bras, MD;  Location: [place];  Service: Radiology;   Laterality: N/A; HPI: Nykiah Rothermel is a 78 y.o. F with PMH of CAD and MI, HTN, HL, cardiomyopathy, Type II DM, and bilateral carotid artery stenosis s/p right carotid endarterectomy in 2008. Pt was found by her son 8/31 in bed with flaccid L side, L facial droop and slurred speech; prior level of functioning was independent. MRI revealed R middle cerebral infarct affecting the insula and frontoparietal brain, CXR normal. Pt was intubated upon arrival on 8/31, extubated 9/1, and is currently on room air.  No data recorded Assessment / Plan / Recommendation CHL IP CLINICAL IMPRESSIONS 11/28/2018 Clinical Impression Pt was awake for study with periods of drowsiness requiring tactile stimulation intermittently. She was unable to manipulate, control or transit honey thick, puree or thin barium trials. Only one swallow was initiated throughout the study which appeared to be her saliva without barium/bolus. Barium remained on anterior tongue, anterior and left sulci and leaked from left side oral cavity despite multiple dry spoon presentations, tactile stimulation and verbal cueing. Barium was suctioned from oral cavity and cleaned with a toothette sponge. Her deconditioning has increased each day ST has worked with pt and overall prognosis is unknown. Palliative care may be helpful for decision making in regards to nutrition.    SLP Visit Diagnosis Dysphagia, oral phase (R13.11) Attention and concentration deficit following -- Frontal lobe and executive function deficit following -- Impact on safety and function Severe aspiration risk   CHL IP TREATMENT RECOMMENDATION 11/28/2018 Treatment Recommendations Therapy as outlined in treatment plan below   Prognosis 11/28/2018 Prognosis for Safe Diet Advancement Guarded Barriers to Reach Goals Severity of deficits Barriers/Prognosis Comment -- CHL IP DIET RECOMMENDATION 11/28/2018 SLP Diet Recommendations NPO Liquid Administration via -- Medication Administration Via alternative means  Compensations -- Postural Changes --   CHL IP OTHER RECOMMENDATIONS 11/28/2018 Recommended Consults -- Oral Care Recommendations Oral care QID Other Recommendations --   CHL IP FOLLOW UP RECOMMENDATIONS 11/28/2018 Follow up Recommendations Skilled Nursing facility   [facility] IP FREQUENCY AND DURATION 11/28/2018 Speech Therapy Frequency (ACUTE ONLY) min 1 x/week Treatment Duration 1 week      CHL IP ORAL PHASE 11/28/2018 Oral Phase Impaired Oral - Pudding Teaspoon -- Oral - Pudding Cup -- Oral - Honey Teaspoon -- Oral - Honey Cup Holding of bolus;Pocketing in anterior sulcus;Left anterior bolus loss Oral - Nectar Teaspoon --  Oral - Nectar Cup -- Oral - Nectar Straw -- Oral - Thin Teaspoon -- Oral - Thin Cup Left anterior bolus loss;Holding of bolus Oral - Thin Straw Other (Comment) Oral - Puree Holding of bolus;Pocketing in anterior sulcus;Left anterior bolus loss Oral - Mech Soft -- Oral - Regular -- Oral - Multi-Consistency -- Oral - Pill -- Oral Phase - Comment --  CHL IP PHARYNGEAL PHASE 11/28/2018 Pharyngeal Phase (No Data) Pharyngeal- Pudding Teaspoon -- Pharyngeal -- Pharyngeal- Pudding Cup -- Pharyngeal -- Pharyngeal- Honey Teaspoon -- Pharyngeal -- Pharyngeal- Honey Cup -- Pharyngeal -- Pharyngeal- Nectar Teaspoon -- Pharyngeal -- Pharyngeal- Nectar Cup -- Pharyngeal -- Pharyngeal- Nectar Straw -- Pharyngeal -- Pharyngeal- Thin Teaspoon -- Pharyngeal -- Pharyngeal- Thin Cup -- Pharyngeal -- Pharyngeal- Thin Straw -- Pharyngeal -- Pharyngeal- Puree -- Pharyngeal -- Pharyngeal- Mechanical Soft -- Pharyngeal -- Pharyngeal- Regular -- Pharyngeal -- Pharyngeal- Multi-consistency -- Pharyngeal -- Pharyngeal- Pill -- Pharyngeal -- Pharyngeal Comment --  CHL IP CERVICAL ESOPHAGEAL PHASE 11/28/2018 Cervical Esophageal Phase (No Data) Pudding Teaspoon -- Pudding Cup -- Honey Teaspoon -- Honey Cup -- Nectar Teaspoon -- Nectar Cup -- Nectar Straw -- Thin Teaspoon -- Thin Cup -- Thin Straw -- Puree -- Mechanical Soft -- Regular --  Multi-consistency -- Pill -- Cervical Esophageal Comment -- [place] Siren 11/28/2018, 2:24 PM  Orbie Pyo [place] M.Ed Risk analyst [phone] Office [phone]                Assessment/Plan HTN CAD H/O MI DM CVA  Dysphagia Patient does answer yes that she understands she is getting a PEG tube and states yes she is ok with that.  Her son is also present at the bedside who states they have talked about this in the past and this is what she wants.  We discussed the procedure along with risks and complications including but not limited to bleeding, infection, intra-abdominal leakage if tube pulled out resulting in possible need for surgery, leaking around the tube, tube site infection, risk of injury to surrounding structures, etc.  The patient's son, who is her POA, understands and is agreeable to proceed.  We also discussed that her eliquis is being held and she will be bridged with heparin.  We will hold this 4 hrs prior to her procedure as well as her TFs at [facility] the night before.  This is scheduled for Friday morning at [place], [facility] 11/28/2018, 4:21 PM Pager: [phone]

## 2018-11-28 NOTE — Consult Note (Signed)
[place] May 04, 1940  [id number].    Requesting MD: Dr. Rosalin Hawking Chief Complaint/Reason for Consult: dysphagia  HPI:  This is a 78 yo black female with a history of CAD s/p MI with significant carotid artery disease, along with a previous CVA, DM, and HTN.  She apparently was on anticoagulation according to the son, but was taken off of this 3-4 months ago.  He is unclear why or what medication she was on. She was living by herself prior to this CVA and doing all things for herself.  She was admitted secondary to an embolic CVA now with left-sided neglect and preferential right gaze.  She has also been found to have a fib and has been placed on eliquis.  She now has significant dysphagia and a PEG tube has been requested.  ROS: ROS: Unable as the patient can speak a small amount but not able to answer many questions due to her CVA.  Family History  Problem Relation Age of Onset   Heart disease Mother    Diabetes Mother    Heart disease Brother    Diabetes Brother     Past Medical History:  Diagnosis Date   Arthritis    CAD (coronary artery disease)    Carotid artery occlusion    Diabetes mellitus    Hyperlipidemia    Hypertension    Myocardial infarction [facility])     Past Surgical History:  Procedure Laterality Date   CAROTID ENDARTERECTOMY  01/06/2007   right - with Dacron patch angioplasty   HERNIA REPAIR  2012   IR CT HEAD LTD  11/20/2018   IR PERCUTANEOUS ART THROMBECTOMY/INFUSION INTRACRANIAL INC DIAG ANGIO  11/20/2018   RADIOLOGY WITH ANESTHESIA N/A 11/20/2018   Procedure: CODE STROKE;  Surgeon: Luanne Bras, MD;  Location: [place];  Service: Radiology;  Laterality: N/A;    Social History:  reports that she quit smoking about 31 years ago. Her smoking use included cigarettes. She smoked 1.00 pack per day. She has never used smokeless tobacco. She reports that she does not drink alcohol. No history on file for drug.  Allergies: No Known  Allergies  Medications Prior to Admission  Medication Sig Dispense Refill   aspirin EC 81 MG tablet Take 81 mg by mouth daily.       cholecalciferol (VITAMIN D) 1000 UNITS tablet Take 2,000 Units by mouth daily.     cilostazol (PLETAL) 100 MG tablet Take 100 mg by mouth 2 (two) times daily.      cloNIDine (CATAPRES) 0.1 MG tablet Take 0.1 mg by mouth at bedtime.      dicyclomine (BENTYL) 10 MG capsule Take 10 mg by mouth 4 (four) times daily as needed for spasms.     furosemide (LASIX) 20 MG tablet Take 20 mg by mouth every other day.     labetalol (NORMODYNE) 200 MG tablet Take 400 mg by mouth 2 (two) times daily.      loratadine (CLARITIN) 10 MG tablet Take 10 mg by mouth Daily.     metFORMIN (GLUCOPHAGE) 500 MG tablet Take 500 mg by mouth 2 (two) times daily with a meal.     omeprazole (PRILOSEC) 20 MG capsule Take 20 mg by mouth daily.     Pancrelipase, Lip-Prot-Amyl, (ZENPEP) 40000-126000 units CPEP Take 1-2 capsules by mouth See admin instructions. Take 2 capsules by mouth three times daily with meals and 1 capsule as needed twice daily with snacks.     sacubitril-valsartan (ENTRESTO)  49-51 MG Take 1 tablet by mouth 2 (two) times daily.     simvastatin (ZOCOR) 20 MG tablet Take 20 mg by mouth at bedtime.         Physical Exam: Blood pressure (!) 141/68, pulse 67, temperature 98.8 F (37.1 C), temperature source Oral, resp. rate 12, height 5\' 1"  (1.549 m), weight 47.7 kg, SpO2 100 %. General: pleasant, skinny black female who is laying in bed in NAD HEENT: head is normocephalic, atraumatic.  Sclera are noninjected.  PERRL.  Ears and nose without any masses or lesions.  Mouth is pink and moist.  She has a definite right sided preferential gaze.  She has a Cortrak in her nose. Heart: regular, rate, and rhythm.  Normal s1,s2. No obvious murmurs, gallops, or rubs noted.  Palpable radial and pedal pulses bilaterally Lungs: CTAB, no wheezes, rhonchi, or rales noted.  Respiratory  effort nonlabored Abd: soft, NT, ND, +BS, no masses, hernias, or organomegaly.  Right inguinal hernia scar noted Neuro: does follow commands for most part.  Does not move LUE or LLE.  Squeezes my hand through her mitten on her right hand.  Good strength in her RUE and RLE. Skin: warm and dry with no masses, lesions, or rashes Psych: A&Ox3 but unable to hold a conversation due to CVA   Results for orders placed or performed during the hospital encounter of 11/20/18 (from the past 48 hour(s))  Glucose, capillary     Status: Abnormal   Collection Time: 11/26/18  4:30 PM  Result Value Ref Range   Glucose-Capillary 194 (H) 70 - 99 mg/dL  Glucose, capillary     Status: Abnormal   Collection Time: 11/26/18  7:56 PM  Result Value Ref Range   Glucose-Capillary 138 (H) 70 - 99 mg/dL  Glucose, capillary     Status: Abnormal   Collection Time: 11/26/18 11:34 PM  Result Value Ref Range   Glucose-Capillary 139 (H) 70 - 99 mg/dL  Glucose, capillary     Status: Abnormal   Collection Time: 11/27/18  3:33 AM  Result Value Ref Range   Glucose-Capillary 179 (H) 70 - 99 mg/dL  CBC     Status: Abnormal   Collection Time: 11/27/18  7:04 AM  Result Value Ref Range   WBC 6.8 4.0 - 10.5 K/uL   RBC 3.56 (L) 3.87 - 5.11 MIL/uL   Hemoglobin 10.1 (L) 12.0 - 15.0 g/dL   HCT 31.3 (L) 36.0 - 46.0 %   MCV 87.9 80.0 - 100.0 fL   MCH 28.4 26.0 - 34.0 pg   MCHC 32.3 30.0 - 36.0 g/dL   RDW 16.8 (H) 11.5 - 15.5 %   Platelets 251 150 - 400 K/uL   nRBC 0.0 0.0 - 0.2 %    Comment: Performed at [facility], [address], [place], [place] [id number]  Basic metabolic panel     Status: Abnormal   Collection Time: 11/27/18  7:04 AM  Result Value Ref Range   Sodium 138 135 - 145 mmol/L   Potassium 4.3 3.5 - 5.1 mmol/L   Chloride 103 98 - 111 mmol/L   CO2 25 22 - 32 mmol/L   Glucose, Bld 180 (H) 70 - 99 mg/dL   BUN 17 8 - 23 mg/dL   Creatinine, Ser 0.94 0.44 - 1.00 mg/dL   Calcium 8.5 (L) 8.9 - 10.3 mg/dL    GFR calc non Af Amer 59 (L) >60 mL/min   GFR calc Af Amer >60 >60  mL/min   Anion gap 10 5 - 15    Comment: Performed at [facility] [address], [place], [place] [address]  Magnesium     Status: None   Collection Time: 11/27/18  7:04 AM  Result Value Ref Range   Magnesium 1.8 1.7 - 2.4 mg/dL    Comment: Performed at [facility] [address] [place], [place] [address]  Glucose, capillary     Status: Abnormal   Collection Time: 11/27/18  9:16 AM  Result Value Ref Range   Glucose-Capillary 204 (H) 70 - 99 mg/dL  Glucose, capillary     Status: Abnormal   Collection Time: 11/27/18 11:28 AM  Result Value Ref Range   Glucose-Capillary 191 (H) 70 - 99 mg/dL  Glucose, capillary     Status: Abnormal   Collection Time: 11/27/18  5:40 PM  Result Value Ref Range   Glucose-Capillary 171 (H) 70 - 99 mg/dL  Glucose, capillary     Status: Abnormal   Collection Time: 11/27/18  8:29 PM  Result Value Ref Range   Glucose-Capillary 145 (H) 70 - 99 mg/dL  Glucose, capillary     Status: Abnormal   Collection Time: 11/27/18 11:16 PM  Result Value Ref Range   Glucose-Capillary 174 (H) 70 - 99 mg/dL  CBC     Status: Abnormal   Collection Time: 11/28/18  3:20 AM  Result Value Ref Range   WBC 7.7 4.0 - 10.5 K/uL   RBC 3.68 (L) 3.87 - 5.11 MIL/uL   Hemoglobin 10.2 (L) 12.0 - 15.0 g/dL   HCT 31.7 (L) 36.0 - 46.0 %   MCV 86.1 80.0 - 100.0 fL   MCH 27.7 26.0 - 34.0 pg   MCHC 32.2 30.0 - 36.0 g/dL   RDW 16.7 (H) 11.5 - 15.5 %   Platelets 271 150 - 400 K/uL   nRBC 0.0 0.0 - 0.2 %    Comment: Performed at [facility], [place]. [address] [place], [place] [id number]  Basic metabolic panel     Status: Abnormal   Collection Time: 11/28/18  3:20 AM  Result Value Ref Range   Sodium 140 135 - 145 mmol/L   Potassium 4.4 3.5 - 5.1 mmol/L   Chloride 104 98 - 111 mmol/L   CO2 24 22 - 32 mmol/L   Glucose, Bld 145 (H) 70 - 99 mg/dL   BUN 18 8 - 23 mg/dL   Creatinine, Ser 0.87 0.44 - 1.00  mg/dL   Calcium 9.2 8.9 - 10.3 mg/dL   GFR calc non Af Amer >60 >60 mL/min   GFR calc Af Amer >60 >60 mL/min   Anion gap 12 5 - 15    Comment: Performed at [facility], [place] [address], [place], [place] [address]  Glucose, capillary     Status: Abnormal   Collection Time: 11/28/18  4:30 AM  Result Value Ref Range   Glucose-Capillary 178 (H) 70 - 99 mg/dL  Glucose, capillary     Status: Abnormal   Collection Time: 11/28/18  5:24 AM  Result Value Ref Range   Glucose-Capillary 162 (H) 70 - 99 mg/dL  Glucose, capillary     Status: Abnormal   Collection Time: 11/28/18  8:04 AM  Result Value Ref Range   Glucose-Capillary 194 (H) 70 - 99 mg/dL   Comment 1 Notify RN    Comment 2 Document in Chart   Glucose, capillary     Status: Abnormal  Collection Time: 11/28/18 11:56 AM  Result Value Ref Range   Glucose-Capillary 177 (H) 70 - 99 mg/dL   Comment 1 Notify RN    Comment 2 Document in Chart    Dg Swallowing Func-speech Pathology  Result Date: 11/28/2018 Objective Swallowing Evaluation: Type of Study: MBS-Modified Barium Swallow Study  Patient Details Name: NIOKA SETZLER MRN: [id number] Date of Birth: 17-Mar-1941 Today's Date: 11/28/2018 Time: SLP Start Time (ACUTE ONLY): 1215 -SLP Stop Time (ACUTE ONLY): [id number] SLP Time Calculation (min) (ACUTE ONLY): 24 min Past Medical History: Past Medical History: Diagnosis Date  Arthritis   CAD (coronary artery disease)   Carotid artery occlusion   Diabetes mellitus   Hyperlipidemia   Hypertension   Myocardial infarction [facility])  Past Surgical History: Past Surgical History: Procedure Laterality Date  CAROTID ENDARTERECTOMY  01/06/2007  right - with Dacron patch angioplasty  HERNIA REPAIR  2012  IR CT HEAD LTD  11/20/2018  IR PERCUTANEOUS ART THROMBECTOMY/INFUSION INTRACRANIAL INC DIAG ANGIO  11/20/2018  RADIOLOGY WITH ANESTHESIA N/A 11/20/2018  Procedure: CODE STROKE;  Surgeon: Luanne Bras, MD;  Location: [place];  Service: Radiology;   Laterality: N/A; HPI: [name] is a 78 y.o. F with PMH of CAD and MI, HTN, HL, cardiomyopathy, Type II DM, and bilateral carotid artery stenosis s/p right carotid endarterectomy in 2008. Pt was found by her son 8/31 in bed with flaccid L side, L facial droop and slurred speech; prior level of functioning was independent. MRI revealed R middle cerebral infarct affecting the insula and frontoparietal brain, CXR normal. Pt was intubated upon arrival on 8/31, extubated 9/1, and is currently on room air.  No data recorded Assessment / Plan / Recommendation CHL IP CLINICAL IMPRESSIONS 11/28/2018 Clinical Impression Pt was awake for study with periods of drowsiness requiring tactile stimulation intermittently. She was unable to manipulate, control or transit honey thick, puree or thin barium trials. Only one swallow was initiated throughout the study which appeared to be her saliva without barium/bolus. Barium remained on anterior tongue, anterior and left sulci and leaked from left side oral cavity despite multiple dry spoon presentations, tactile stimulation and verbal cueing. Barium was suctioned from oral cavity and cleaned with a toothette sponge. Her deconditioning has increased each day ST has worked with pt and overall prognosis is unknown. Palliative care may be helpful for decision making in regards to nutrition.    SLP Visit Diagnosis Dysphagia, oral phase (R13.11) Attention and concentration deficit following -- Frontal lobe and executive function deficit following -- Impact on safety and function Severe aspiration risk   CHL IP TREATMENT RECOMMENDATION 11/28/2018 Treatment Recommendations Therapy as outlined in treatment plan below   Prognosis 11/28/2018 Prognosis for Safe Diet Advancement Guarded Barriers to Reach Goals Severity of deficits Barriers/Prognosis Comment -- CHL IP DIET RECOMMENDATION 11/28/2018 SLP Diet Recommendations NPO Liquid Administration via -- Medication Administration Via alternative means  Compensations -- Postural Changes --   CHL IP OTHER RECOMMENDATIONS 11/28/2018 Recommended Consults -- Oral Care Recommendations Oral care QID Other Recommendations --   CHL IP FOLLOW UP RECOMMENDATIONS 11/28/2018 Follow up Recommendations Skilled Nursing facility   [facility] IP FREQUENCY AND DURATION 11/28/2018 Speech Therapy Frequency (ACUTE ONLY) min 1 x/week Treatment Duration 1 week      CHL IP ORAL PHASE 11/28/2018 Oral Phase Impaired Oral - Pudding Teaspoon -- Oral - Pudding Cup -- Oral - Honey Teaspoon -- Oral - Honey Cup Holding of bolus;Pocketing in anterior sulcus;Left anterior bolus loss Oral - Nectar Teaspoon --  Oral - Nectar Cup -- Oral - Nectar Straw -- Oral - Thin Teaspoon -- Oral - Thin Cup Left anterior bolus loss;Holding of bolus Oral - Thin Straw Other (Comment) Oral - Puree Holding of bolus;Pocketing in anterior sulcus;Left anterior bolus loss Oral - Mech Soft -- Oral - Regular -- Oral - Multi-Consistency -- Oral - Pill -- Oral Phase - Comment --  CHL IP PHARYNGEAL PHASE 11/28/2018 Pharyngeal Phase (No Data) Pharyngeal- Pudding Teaspoon -- Pharyngeal -- Pharyngeal- Pudding Cup -- Pharyngeal -- Pharyngeal- Honey Teaspoon -- Pharyngeal -- Pharyngeal- Honey Cup -- Pharyngeal -- Pharyngeal- Nectar Teaspoon -- Pharyngeal -- Pharyngeal- Nectar Cup -- Pharyngeal -- Pharyngeal- Nectar Straw -- Pharyngeal -- Pharyngeal- Thin Teaspoon -- Pharyngeal -- Pharyngeal- Thin Cup -- Pharyngeal -- Pharyngeal- Thin Straw -- Pharyngeal -- Pharyngeal- Puree -- Pharyngeal -- Pharyngeal- Mechanical Soft -- Pharyngeal -- Pharyngeal- Regular -- Pharyngeal -- Pharyngeal- Multi-consistency -- Pharyngeal -- Pharyngeal- Pill -- Pharyngeal -- Pharyngeal Comment --  CHL IP CERVICAL ESOPHAGEAL PHASE 11/28/2018 Cervical Esophageal Phase (No Data) Pudding Teaspoon -- Pudding Cup -- Honey Teaspoon -- Honey Cup -- Nectar Teaspoon -- Nectar Cup -- Nectar Straw -- Thin Teaspoon -- Thin Cup -- Thin Straw -- Puree -- Mechanical Soft -- Regular --  Multi-consistency -- Pill -- Cervical Esophageal Comment -- [place] Siren 11/28/2018, 2:24 PM  Orbie Pyo Litaker M.Ed Risk analyst [phone] Office [phone]                Assessment/Plan HTN CAD H/O MI DM CVA  Dysphagia Patient does answer yes that she understands she is getting a PEG tube and states yes she is ok with that.  Her son is also present at the bedside who states they have talked about this in the past and this is what she wants.  We discussed the procedure along with risks and complications including but not limited to bleeding, infection, intra-abdominal leakage if tube pulled out resulting in possible need for surgery, leaking around the tube, tube site infection, risk of injury to surrounding structures, etc.  The patient's son, who is her POA, understands and is agreeable to proceed.  We also discussed that her eliquis is being held and she will be bridged with heparin.  We will hold this 4 hrs prior to her procedure as well as her TFs at [place] the night before.  This is scheduled for Friday morning at [place], [facility] 11/28/2018, 4:21 PM Pager: [phone]

## 2018-11-28 NOTE — Progress Notes (Signed)
STROKE TEAM PROGRESS NOTE   INTERVAL HISTORY Pt son at bedside. Pt neuro stable, seems slight more awake interactive. Son still wanted pt to go home and he has arranged other family member come to help out. Discussed with PEG tube placement since she did not pass MBS again today. Son discussed with pt and OK with PEG. Will consult trauma surgery for PEG. eliquis can be bridged with heparin.    Vitals:   11/28/18 0511 11/28/18 0513 11/28/18 0738 11/28/18 1131  BP: 140/66  (!) 143/71 (!) 141/68  Pulse: 66  80 67  Resp:   (!) 22 12  Temp:   98.4 F (36.9 C) 98.8 F (37.1 C)  TempSrc:   Oral Oral  SpO2:   100% 100%  Weight:  47.7 kg    Height:        CBC:  Recent Labs  Lab 11/27/18 0704 11/28/18 0320  WBC 6.8 7.7  HGB 10.1* 10.2*  HCT 31.3* 31.7*  MCV 87.9 86.1  PLT 251 [id number]    Basic Metabolic Panel:  Recent Labs  Lab 11/25/18 0446 11/26/18 0503 11/27/18 0704 11/28/18 0320  NA 139 139 138 140  K 3.5 3.1* 4.3 4.4  CL 107 103 103 104  CO2 19* 25 25 24   GLUCOSE 211* 188* 180* 145*  BUN 19 16 17 18   CREATININE 0.97 0.91 0.94 0.87  CALCIUM 8.3* 8.5* 8.5* 9.2  MG 1.9  --  1.8  --   PHOS 2.1* 3.0  --   --    Lipid Panel:     Component Value Date/Time   CHOL 124 11/21/2018 0538   TRIG 106 11/23/2018 0221   HDL 39 (L) 11/21/2018 0538   CHOLHDL 3.2 11/21/2018 0538   VLDL 13 11/21/2018 0538   LDLCALC 72 11/21/2018 0538   HgbA1c:  Lab Results  Component Value Date   HGBA1C 6.6 (H) 11/21/2018    IMAGING  Ct Head Code Stroke Wo Contrast 11/20/2018 1. Subtle hyperdense right MCA. There is occlusion of the right distal M1 on CTA today. 2. ASPECTS is 10 3. No acute infarct or hemorrhage   Ct Angio Head W Or Wo Contrast Ct Angio Neck W Or Wo Contrast 11/20/2018 1. Occlusion right M1 segment distally with poor flow in the right M2 and M3 branches. 2. Postop right carotid endarterectomy 2008. Right carotid bifurcation widely patent 3. Severe atherosclerotic disease left  carotid bifurcation with occlusion of the left internal carotid artery. 4. Left anterior and left middle cerebral arteries appear patent without significant stenosis through collateral flow through the anterior communicating artery. 5. Posterior circulation intact    Cerebral Angiogram 11/20/2018 1.occluded Rt MCA distal M1 seg. Unsuccessful revascularization Despite x 2 passes with 28mm xc 25mm embotrap retriever device and penumbra aspiration  due to underlying hard ICAD. Unsuccessful intracranial rescue stenting dut vascular tortuosity.  Mr Brain Wo Contrast 11/21/2018 8 cm confluent region of infarction in the right middle cerebral artery territory affecting the insula and frontoparietal brain. Mild swelling but no evidence of hemorrhage at this time or of significant mass effect or any shift. Old infarctions within the right cerebellum, left occipital lobe and at the left frontoparietal vertex.   Ct Head  Wo Contrast 11/25/2018 Subacute large RIGH T MCA territory infarct with 3 mm of RIGHT to LEFT midline shift, new since 11/20/2018 but consistent with large subacute infarct. Underlying atrophy with small vessel chronic ischemic changes of deep cerebral white matter. No additional new intracranial abnormalities.  Dg Chest Port 1 View 11/20/2018 1. The endotracheal tube is borderline low. Repositioning should be considered. 2. Cardiomegaly with mild vascular congestion.  11/21/2018 Endotracheal tube in satisfactory position. No acute abnormality noted.  11/25/2018 1. Interval extubation and placement of an enteric tube coursing below the diaphragm with the distal tip out of field of view. 2. Chronic bilateral increased bronchial markings likely chronic bronchitis. No definite new focal infiltrate. 3. Small area of lucency at the right lung base is likely artifactual. Consider repeat radiograph, preferably PA and lateral radiographs, to exclude small pneumothorax.  2D Echocardiogram  1.  Akinesis of the left ventricular, entire inferior wall and inferolateral wall.  2. The left ventricle has moderately reduced systolic function, with an ejection fraction of 35-40%. The cavity size was normal. There is mild concentric left ventricular hypertrophy. Left ventricular diastolic Doppler parameters are consistent with pseudonormalization. Elevated mean left atrial pressure.  3. The right ventricle has normal systolic function. The cavity was normal. There is no increase in right ventricular wall thickness. Right ventricular systolic pressure could not be assessed.  4. Left atrial size was mildly dilated.  5. There is mild mitral annular calcification present.  6. The aorta is normal unless otherwise noted.  EEG - evidence of a right hemispheric dysfunction which is consistent with the patient's known right hemispheric infarct.   PHYSICAL EXAM     Elderly African-American lady awake alert in chair. Afebrile. Head is nontraumatic. Neck is supple without bruit.    Cardiac exam no murmur or gallop. Lungs with coarse rhonchi. Distal pulses are well felt.  Neurological Exam : Patient is awake alert sitting in chair, severe dysarthria but orientated to place, age, self and year, but not to month, followed most simple commands.  She has right gaze deviation.  She is not able to follow gaze to the left.  She blinks to threat inconsistently bilaterally.  Pupils equal reactive.  Fundi not visualized. left facial droop. cortrak tube in place. She does move right upper and lower extremity against gravity.  She withdraws left lower extremity mildly to pain.  Does not withdraw left upper extremity. Tone is diminished on the left and normal on the right.  Right plantars downgoing left is equivocal.  ASSESSMENT/PLAN Ms. ENYA WUEBBEN is a 78 y.o. female with history of myocardial infarction, hypertension, hyperlipidemia, diabetes, carotid artery occlusion on the left, CAD and arthritis presenting with L  sided neglect, R eye deviation and L side flaccid. Received tPA 11/20/2018 at 1545. CTA showed R M1 occlusion. Taken to IR.  SBP > 220.   Stroke: R MCA insular/frontoparietal infarct due to right ICA and MCA occlusion s/p tPA and unsuccessful IR - embolic likely from new diagnosed AF  Code Stroke CT head subtle hyperdense R MCA.     CTA head & neck R M1 Occlusion w/ poor flor R M2 and R M3. R CEA 2008 w/ patent bifurcation. Severe L ICA bifurcation atherosclerosis w/ occlusion L ICA.   Cerebral angio occluded R M1. Unsuccessful revascularization after 2 passes w/ embotrap and penumbra aspiration d/t underlying hard atherosclerosis and vascular tortuosity.  Post IR CT no ICH or mass effect  MRI R MCA infarct insular and frontoparietal. Old R cerebellar, L occipital, L frontoparietal vertex infarcts.  CT repeat 9/5 right large infarct with 83mm MLS, no hemorrhage  2D Echo EF 35-40%. LA mildly dilated. No source of embolus   LDL 72  HgbA1c 6.6   EEG 11/25/18 - evidence of  a right hemispheric dysfunction which is consistent with the patient's known right hemispheric infarct.  Eliquis for VTE prophylaxis  aspirin 81 mg daily and pletal prior to admission, now on aspirin 81mg  and eliquis - will hold Eliquis starting today for PEG later this week. Will ask pharmacy to bridge with IV heparin until eliquis can be resumed post PEG  Therapy recommendations:  SNF - son wants to take home with [facility]  Disposition:  pending  (lives alone, son checks on daily)  Acute Respiratory Failure w/ hypoxia  Intubated for IR, not extubated at the end of the procedure d/t tPA administration and sheath left in  Sheath now out  Extubated 9/2   Cheyne stokes breathing pattern 11/25/18   Much improved   Atrial Fibrillation w/ RVR, confirmed, new dx PSVT  telemetry monitoring showed AF    Treated with metoprolol  Cardiology consulted, appreciate help  Off cardizem givem cardiomyopathy    Metoprolol  up to 50 mg bid  will hold Eliquis starting today for PEG later this week. Will ask pharmacy to bridge with IV heparin    Dysphagia  Secondary to stroke  Failed MBSS, did poorly with repeat MBSS  On TF @ 50  On free water 200 Q4  SLP on board  Son discussed with pt and OK with PEG  Trauma consulted.  Hold eliquis - bridge w/ IV heparin until eliquis can be resumed  Carotid Disease  L ICA occlusion  Hx of right CEA 2008  Hypertensive Urgency  SBP > 220 in ED, as high as 222/128  Home meds:  norvasc 10, clonidine 0.1 bid, labetalol 200 bid, valsartan 320-12.5 daily  Treated w/ cleviprex, Now off.   Now on avapro, metoprolol, cardizem  BP stable  Long-term BP goal 130-150 given right ICA occlusion  Hyperlipidemia  Home meds:  zocor 20  LDL 72, goal < 70  Resume zocor 20  Continue statin at discharge  Diabetes type II Controlled  Home meds:  metformin 500  HgbA1c 6.6, goal < 7.0  CBGs  SSI  DB RN on baord  Mild hyperglycemia  Increased metformin 500 bid to 850mg  bid   Other Stroke Risk Factors  Advanced age  Former Cigarette smoker  Coronary artery disease s/p ? MI 20 yrs ago. No noted cardiac cath    Cardiomyopathy, euvolemic  - on irbesartan and BB. F/u w/ cardiology  Other Active Problems  R DP and PT not dopplerable prior to or post IR  Acute blood loss anemia 13.9->9.2->10.4->10.1->10.2  Urinary retention s/p multiple I&Os - on foley, UA neg  Hypokalemia - K 3.1-> supplement ->4.3->4.4   Hospital day # 8   Rosalin Hawking, MD PhD Stroke Neurology 11/28/2018 2:08 PM   To contact Stroke Continuity provider, please refer to [email or web address] After hours, contact General Neurology

## 2018-11-28 NOTE — Progress Notes (Signed)
[facility] for Heparin >> Apixaban Indication: atrial fibrillation and stroke  No Known Allergies  Patient Measurements: Height: 5\' 1"  (154.9 cm) Weight: 105 lb 2.6 oz (47.7 kg) IBW/kg (Calculated) : 47.8 Heparin Dosing Weight: 46kg  Vital Signs: Temp: 98.8 F (37.1 C) (09/08 1131) Temp Source: Oral (09/08 1131) BP: 141/68 (09/08 1131) Pulse Rate: 67 (09/08 1131)  Labs: Recent Labs    11/25/18 2047  11/26/18 0503 11/27/18 0704 11/28/18 0320  HGB  --    < > 10.4* 10.1* 10.2*  HCT  --   --  31.8* 31.3* 31.7*  PLT  --   --  238 251 271  HEPARINUNFRC 0.24*  --  0.45  --   --   CREATININE  --   --  0.91 0.94 0.87   < > = values in this interval not displayed.    Estimated Creatinine Clearance: 41.4 mL/min (by C-G formula based on SCr of 0.87 mg/dL).   Assessment: 78 year old female with new afib. New cva, was given tpa on 8/31. The patient was started on Heparin initially, transitioned to apixaban>> now plan for PEG tube so will transition back to heparin with low end of goal and avoid bolusing given recent stroke requiring tpa.   Last dose of apixaban was 9/8@0954 . Hgb 10.2, plt 271. No s/sx of bleeding. Current plan for PEG on 9/11@0730 .   Given recent apixaban dosing, will monitor aPTT and heparin level until correlate.   Goal of Therapy:  APTT 66-85 sec Heparin level 0.3-0.5 units/ml Monitor platelets by anticoagulation protocol: Yes   Plan:  - Restart heparin at 900 units/hr on 9/8 at 2200 - Will order aPTT/HL 8 hr after restart - Will monitor timing of PEG placement and restart of apixaban once cleared by surgery  Thank you for allowing pharmacy to be a part of this patient's care.  Antonietta Jewel, PharmD, BCCCP Clinical Pharmacist  Phone: [phone]  Please check AMION for all [facility] phone numbers After 10:00 PM, call [place] [phone]

## 2018-11-28 NOTE — TOC Initial Note (Addendum)
Transition of Care [facility]) - Initial/Assessment Note    Patient Details  Name: [name] MRN: [id number] Date of Birth: 07-Nov-1940  Transition of Care [facility]) CM/SW Contact:    Pollie Friar, RN Phone Number: 11/28/2018, 1:29 PM  Clinical Narrative:                 CM met with the patient and her son at the bedside. Pts son wants to take his mother home with [facility] services once she is medically ready for d/c.  TOC following for [facility] choice, DME needs and will arrange to have expedited form sent to [employer] for aide services.  Son is able with assistance of other family members to provide 24 hour assistance/ supervision.  Expected Discharge Plan: [place] Barriers to Discharge: Continued Medical Work up   Patient Goals and CMS Choice   CMS Medicare.gov Compare Post Acute Care list provided to:: Patient Represenative (must comment) Choice offered to / list presented to : Adult Children  Expected Discharge Plan and Services Expected Discharge Plan: [place] In-house Referral: Clinical Social Work Discharge Planning Services: CM Consult   Living arrangements for the past 2 months: [place]                                      Prior Living Arrangements/Services Living arrangements for the past 2 months: Single Family Home Lives with:: Self Patient language and need for interpreter reviewed:: Yes(no needs)        Need for Family Participation in Patient Care: Yes (Comment)(24 hour supervision and assistance) Care giver support system in place?: Yes (comment)(son and other family members to provide the needed supervision)   Criminal Activity/Legal Involvement Pertinent to Current Situation/Hospitalization: No - Comment as needed  Activities of Daily Living Home Assistive Devices/Equipment: None ADL Screening (condition at time of admission) Patient's cognitive ability adequate to safely complete daily  activities?: No Is the patient deaf or have difficulty hearing?: No Does the patient have difficulty seeing, even when wearing glasses/contacts?: No Does the patient have difficulty concentrating, remembering, or making decisions?: No Patient able to express need for assistance with ADLs?: No Does the patient have difficulty dressing or bathing?: Yes Independently performs ADLs?: No Communication: Dependent Is this a change from baseline?: Change from baseline, expected to last <3 days Dressing (OT): Dependent Is this a change from baseline?: Change from baseline, expected to last >3 days Grooming: Dependent Is this a change from baseline?: Change from baseline, expected to last >3 days Feeding: Dependent Is this a change from baseline?: Change from baseline, expected to last >3 days Bathing: Dependent Is this a change from baseline?: Change from baseline, expected to last >3 days Toileting: Dependent Is this a change from baseline?: Change from baseline, expected to last <3 days In/Out Bed: Dependent Is this a change from baseline?: Change from baseline, expected to last >3 days Walks in Home: Dependent Is this a change from baseline?: Change from baseline, expected to last >3 days Does the patient have difficulty walking or climbing stairs?: Yes Weakness of Legs: Both Weakness of Arms/Hands: Both  Permission Sought/Granted                  Emotional Assessment Appearance:: Appears stated age   Affect (typically observed): Calm, Quiet Orientation: : Oriented to Self, Oriented to Place, Oriented to  Time   Psych Involvement: No (comment)  Admission diagnosis:  Acute embolic stroke ([place]) [P39.6] Stroke [facility]) [I63.9] Patient Active Problem List   Diagnosis Date Noted  . Protein-calorie malnutrition, severe 11/23/2018  . Stroke ([place]) 11/20/2018  . Middle cerebral artery embolism, right 11/20/2018  . Heart murmur 11/20/2018  . Bilateral carotid artery stenosis  11/20/2018  . Acute respiratory failure with hypoxia ([place])   . Encephalopathy acute   . Type 2 diabetes mellitus without complication, without long-term current use of insulin ([place])   . End stage renal disease ([place]) 08/11/2011  . Occlusion and stenosis of carotid artery without mention of cerebral infarction 08/11/2011   PCP:  Eloy End, MD Pharmacy:   [facility] [place], [place] - [address] [place] [address] Phone: [phone] Fax: [phone]     Social Determinants of Health (SDOH) Interventions    Readmission Risk Interventions No flowsheet data found.

## 2018-11-28 NOTE — Progress Notes (Addendum)
Progress Note  Patient Name: [name] Date of Encounter: 11/28/2018  Primary Cardiologist: New- Dr. Meda Coffee   Subjective   Awake and follows commands. Answers some questions appropriately. Maintaining NSR>>one episode of SVT overnight   Inpatient Medications    Scheduled Meds: . apixaban  5 mg Per Tube BID  . aspirin  81 mg Per Tube Daily  . chlorhexidine  15 mL Mouth Rinse BID  . Chlorhexidine Gluconate Cloth  6 each Topical Daily  . diltiazem  30 mg Per Tube Q8H  . free water  200 mL Per Tube Q4H  . hydrochlorothiazide  12.5 mg Oral Daily  . insulin aspart  1-3 Units Subcutaneous Q4H  . irbesartan  300 mg Per Tube Daily  . mouth rinse  15 mL Mouth Rinse q12n4p  . metFORMIN  500 mg Oral BID WC  . metoprolol tartrate  37.5 mg Per Tube BID  . pantoprazole sodium  40 mg Per Tube QHS  . sennosides  5 mL Per Tube QHS  . simvastatin  20 mg Oral QHS   Continuous Infusions: . feeding supplement (OSMOLITE 1.2 CAL) 1,000 mL (11/27/18 1311)   PRN Meds: acetaminophen, dicyclomine, senna-docusate   Vital Signs    Vitals:   11/28/18 0333 11/28/18 0511 11/28/18 0513 11/28/18 0738  BP: (!) 149/58 140/66  (!) 143/71  Pulse:  66  80  Resp:    (!) 22  Temp: 97.8 F (36.6 C)   98.4 F (36.9 C)  TempSrc: Axillary   Oral  SpO2:    100%  Weight:   47.7 kg   Height:        Intake/Output Summary (Last 24 hours) at 11/28/2018 0906 Last data filed at 11/28/2018 0438 Gross per 24 hour  Intake 1910 ml  Output 1050 ml  Net 860 ml   Filed Weights   11/26/18 0349 11/27/18 0335 11/28/18 0513  Weight: 47.5 kg 47.6 kg 47.7 kg    Physical Exam   General: Ill appearing, frail, NAD Lungs: Clear to ausculation bilaterally. No wheezes, rales, or rhonchi. Breathing is unlabored. Cardiovascular: RRR with S1 S2. No murmur Abdomen: Soft, non-tender, non-distended. No obvious abdominal masses. Extremities: No edema. No clubbing or cyanosis. DP pulses 2+ bilaterally Neuro: Alert.  No focal deficits. No facial asymmetry. MAE spontaneously. Psych: Following commands   Labs    Chemistry Recent Labs  Lab 11/26/18 0503 11/27/18 0704 11/28/18 0320  NA 139 138 140  K 3.1* 4.3 4.4  CL 103 103 104  CO2 25 25 24   GLUCOSE 188* 180* 145*  BUN 16 17 18   CREATININE 0.91 0.94 0.87  CALCIUM 8.5* 8.5* 9.2  GFRNONAA >60 59* >60  GFRAA >60 >60 >60  ANIONGAP 11 10 12      Hematology Recent Labs  Lab 11/26/18 0503 11/27/18 0704 11/28/18 0320  WBC 6.4 6.8 7.7  RBC 3.71* 3.56* 3.68*  HGB 10.4* 10.1* 10.2*  HCT 31.8* 31.3* 31.7*  MCV 85.7 87.9 86.1  MCH 28.0 28.4 27.7  MCHC 32.7 32.3 32.2  RDW 16.5* 16.8* 16.7*  PLT 238 251 271    Cardiac EnzymesNo results for input(s): TROPONINI in the last 168 hours. No results for input(s): TROPIPOC in the last 168 hours.   BNPNo results for input(s): BNP, PROBNP in the last 168 hours.   DDimer No results for input(s): DDIMER in the last 168 hours.   Radiology    No results found.  Telemetry    11/28/2018 NSR with  rate in the 70's, one episode of SVT (10beat)- Personally Reviewed  ECG    11/24/2018 AF with rate 112- Personally Reviewed  Cardiac Studies   Echocardiogram 11/21/2018:  1. Akinesis of the left ventricular, entire inferior wall and inferolateral wall. 2. The left ventricle has moderately reduced systolic function, with an ejection fraction of 35-40%. The cavity size was normal. There is mild concentric left ventricular hypertrophy. Left ventricular diastolic Doppler parameters are consistent with  pseudonormalization. Elevated mean left atrial pressure. 3. The right ventricle has normal systolic function. The cavity was normal. There is no increase in right ventricular wall thickness. Right ventricular systolic pressure could not be assessed. 4. Left atrial size was mildly dilated. 5. There is mild mitral annular calcification present. 6. The aorta is normal unless otherwise noted.  Patient  Profile     78 y.o. female with a hx of CAD, MI 20 years ago, cardiomyopathy with EF of 30-35% by echo, bilateral carotid artery stenosis with left ICA occlsuion and right carotid endarterectomy (2008),CVA, HTN, HLD, DM, and mitral valve insufficiencywho is being seen for the evaluation of atrial fibrillation.She saw vascular surgery in 2018 for carotid artery stenosis. She has complete occlusion of her left ICA and 40-59% stenosis in the R ICA. No intervention at that time. 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 Notes reveal an echocardiogram with an EF of 30%, unknown if this is ischemic in nature. Per son she was on anticoagulation for a while but does not know when it was discontinued. He states that she has been fully active taking care of her grand daughter, driving,cooking and full independent until Monday when he found her down laying in her bed, she presented to [facility] with left-sided neglect and eye deviation to the right. CT head with R M1 occlusion and family decided to proceed with thrombectomy/TPA. She was hypertensive and tachycardic. She was intubated prior to the procedure. Unfortunately, revascularization was unsuccessful.  Assessment & Plan    1. Atrial fibrillation with RVR: -Maintaining NSR>>had short 10 beat run of PSVT early this AM  -Anticoagulated with Eliquis, ASA -Stop Cardizem in the setting of cardiomyopathy? Will up-titrate metoprolol to 50mg  BID given stable BP, HR and recent SVT burst   2. Cardiomyopathy: -Echocardiogram from 09/01 showed LVEF 35-40% -Appears euvolemic on exam  -Weight, 105lb>>up form 104 on 11/27/2018 -I&O, net negative 1.3L -Continue irbesartan, beta blocker    3. Right MCA stroke: -Presented to [facility] with left-sided neglect and eye deviation to the right. CT head with R M1 occlusion and family decided to proceed with thrombectomy/TPA. She was hypertensive and tachycardic. She was intubated prior to the procedure.  Unfortunately, revascularization was unsuccessful. -Per primary team, neurology  -Plan for discharge to home  -NG to in pace>>re-evaluate swallow>>if fails then will plan for PEG   4. DM2: -SSI for glucose control while inpatient status -On Metformin     Signed, Kathyrn Drown NP-C [facility] Pager: [phone] 11/28/2018, 9:06 AM     For questions or updates, please contact   Please consult [email or web address] for contact info under Cardiology/STEMI.  Personally seen and examined. Agree with above.   Laying in bed, soft restraints in place.  Her son is in room currently.  GEN: Thin, in no acute distress  HEENT: normal  Neck: no JVD, carotid bruits, or masses Cardiac: RRR; no murmurs, rubs, or gallops,no edema  Respiratory:  clear to auscultation bilaterally, normal work of breathing GI: soft, nontender, nondistended, +  BS MS: no deformity or atrophy  Skin: warm and dry, no rash Neuro:  Alert   psych: Able to follow commands  Echo-EF 35 to 40% Telemetry-personally reviewed-sinus rhythm, brief tachycardic episode, 10 beats, possibly PSVT  Assessment and plan:  PSVT/PAT/paroxysmal atrial fibrillation - For the most part, maintaining sinus rhythm.  Brief episode of PSVT earlier.  Agree with stopping Cardizem in the setting of her weekend heart, cardiomyopathy and increasing her metoprolol to 50 twice daily. -Eliquis.  Right MCA stroke - Left-sided neglect.  M1 occlusion.   Dilated cardiomyopathy - I do not see any notes for prior heart catheterization.  Questionable MI 20 years ago.  For now, continue with appropriate medical management.  EF has been low in the past.  He needs to be euvolemic.  Candee Furbish, MD

## 2018-11-28 NOTE — Progress Notes (Signed)
Occupational Therapy Treatment Patient Details Name: [name] MRN: [id number] DOB: 13-Sep-1940 Today's Date: 11/28/2018    History of present illness 78 yo female admitted with R cerebral infarct frontoparietal infarect with failed attempt to revascularization and extubated 9/2 PMH:hx of CAD, MI 33 years ago [facility]?), cardiomyopathy with EF of 30-35% by echo, bilateral carotid artery stenosis with left ICA occlsuion and right carotid endarterectomy (2008), CVA (?2015), HTN, HLD, DM, and mitral valve insufficiency     OT comments  Patient supine in bed, son at bedside, agreeable to OT.  Repositioned in bed, bed placed in semi chair position.  Supported sitting with R UE/LE pushing, placed pillow to support upright trunk.  Engaged in ADLs, requiring maximal hand over hand support with R UE to engage L side of body (UE).  Inconsistently following commands with R UE, even within R side of environment. Intermittently opened eyes during session, but not consistently. PROM to L UE, WFL.  Son assisted throughout session as needed, educated son to provide input to L side.     Follow Up Recommendations  SNF;Supervision/Assistance - 24 hour    Equipment Recommendations  3 in 1 bedside commode;Wheelchair (measurements OT);Wheelchair cushion (measurements OT);Hospital bed    Recommendations for Other Services      Precautions / Restrictions Precautions Precautions: Fall Precaution Comments: L hemi, R gaze preference Restrictions Weight Bearing Restrictions: No       Mobility Bed Mobility Overal bed mobility: Needs Assistance             General bed mobility comments: repositioned in bed with max assist +2, patient able to position R UE to overhead rail and placed R LE in flexion in attempt to assist but no initation   Transfers                 General transfer comment: deferred due to 1+ assist only    Balance Overall balance assessment: Needs assistance     Sitting  balance - Comments: supported sitting in bed, required pillows positioned at trunk on L side as pushing with R LEUE                                   ADL either performed or assessed with clinical judgement   ADL Overall ADL's : Needs assistance/impaired Eating/Feeding: NPO Eating/Feeding Details (indicate cue type and reason): NG tube in place Grooming: Maximal assistance;Wash/dry hands;Bed level(chair position in bed) Grooming Details (indicate cue type and reason): maximal hand over hand support to wash hands, able to hold wash cloth in R hand but requires support for bimanual task Upper Body Bathing: Maximal assistance;Bed level(chair position in bed) Upper Body Bathing Details (indicate cue type and reason): maximal hand over hand support to engage in washing L arm, able to hold wash cloth but requires support to reach cross body and use R UE functionally                                 Vision   Additional Comments: remains eyes closed for majority of session, able to open intermittently but no sustained    Perception     Praxis      Cognition Arousal/Alertness: Awake/alert Behavior During Therapy: Flat affect Overall Cognitive Status: Impaired/Different from baseline Area of Impairment: Attention;Memory;Awareness;Following commands;Problem solving  Current Attention Level: Sustained Memory: Decreased recall of precautions;Decreased short-term memory Following Commands: Follows one step commands with increased time;Follows one step commands inconsistently Safety/Judgement: Decreased awareness of safety;Decreased awareness of deficits Awareness: Intellectual Problem Solving: Slow processing;Decreased initiation;Difficulty sequencing;Requires verbal cues;Requires tactile cues General Comments: pt with strong R gaze preference, poor awareness to L side, attempting to pull out coretrak today        Exercises     Shoulder  Instructions       General Comments VSS    Pertinent Vitals/ Pain       Pain Assessment: Faces Faces Pain Scale: No hurt  Home Living                                          Prior Functioning/Environment              Frequency  Min 2X/week        Progress Toward Goals  OT Goals(current goals can now be found in the care plan section)  Progress towards OT goals: Progressing toward goals  Acute Rehab OT Goals Patient Stated Goal: to see Mariah OT Goal Formulation: With patient  Plan Discharge plan remains appropriate;Frequency remains appropriate    Co-evaluation                 AM-PAC OT "6 Clicks" Daily Activity     Outcome Measure   Help from another person eating meals?: Total Help from another person taking care of personal grooming?: A Lot Help from another person toileting, which includes using toliet, bedpan, or urinal?: Total Help from another person bathing (including washing, rinsing, drying)?: Total Help from another person to put on and taking off regular upper body clothing?: Total Help from another person to put on and taking off regular lower body clothing?: Total 6 Click Score: 7    End of Session    OT Visit Diagnosis: Unsteadiness on feet (R26.81);Muscle weakness (generalized) (M62.81)   Activity Tolerance Patient tolerated treatment well   Patient Left with call bell/phone within reach;with bed alarm set;with family/visitor present(chair position in bed )   Nurse Communication Mobility status;Precautions        Time: [id number] OT Time Calculation (min): 21 min  Charges: OT General Charges $OT Visit: 1 Visit OT Treatments $Self Care/Home Management : 8-22 mins  Delight Stare, [place] Pager [phone] Office [phone]    Delight Stare 11/28/2018, 4:33 PM

## 2018-11-29 DIAGNOSIS — I63411 Cerebral infarction due to embolism of right middle cerebral artery: Principal | ICD-10-CM

## 2018-11-29 LAB — GLUCOSE, CAPILLARY
Glucose-Capillary: 108 mg/dL — ABNORMAL HIGH (ref 70–99)
Glucose-Capillary: 228 mg/dL — ABNORMAL HIGH (ref 70–99)
Glucose-Capillary: 184 mg/dL — ABNORMAL HIGH (ref 70–99)
Glucose-Capillary: 179 mg/dL — ABNORMAL HIGH (ref 70–99)
Glucose-Capillary: 170 mg/dL — ABNORMAL HIGH (ref 70–99)
Glucose-Capillary: 97 mg/dL (ref 70–99)

## 2018-11-29 LAB — CBC
HCT: 32.6 % — ABNORMAL LOW (ref 36.0–46.0)
Hemoglobin: 10.4 g/dL — ABNORMAL LOW (ref 12.0–15.0)
MCH: 27.7 pg (ref 26.0–34.0)
MCHC: 31.9 g/dL (ref 30.0–36.0)
MCV: 86.7 fL (ref 80.0–100.0)
Platelets: 301 10*3/uL (ref 150–400)
RBC: 3.76 MIL/uL — ABNORMAL LOW (ref 3.87–5.11)
RDW: 16.6 % — ABNORMAL HIGH (ref 11.5–15.5)
WBC: 9.5 10*3/uL (ref 4.0–10.5)
nRBC: 0 % (ref 0.0–0.2)

## 2018-11-29 LAB — APTT
aPTT: 52 seconds — ABNORMAL HIGH (ref 24–36)
aPTT: 110 seconds — ABNORMAL HIGH (ref 24–36)

## 2018-11-29 LAB — BASIC METABOLIC PANEL
Anion gap: 11 (ref 5–15)
BUN: 19 mg/dL (ref 8–23)
CO2: 25 mmol/L (ref 22–32)
Calcium: 8.7 mg/dL — ABNORMAL LOW (ref 8.9–10.3)
Chloride: 98 mmol/L (ref 98–111)
Creatinine, Ser: 0.94 mg/dL (ref 0.44–1.00)
GFR calc Af Amer: >60 mL/min (ref >60)
GFR calc non Af Amer: 59 mL/min — ABNORMAL LOW (ref >60)
Glucose, Bld: 224 mg/dL — ABNORMAL HIGH (ref 70–99)
Potassium: 4.1 mmol/L (ref 3.5–5.1)
Sodium: 134 mmol/L — ABNORMAL LOW (ref 135–145)

## 2018-11-29 LAB — HEPARIN LEVEL (UNFRACTIONATED): Heparin Unfractionated: >2.2 IU/mL — ABNORMAL HIGH (ref 0.30–0.70)

## 2018-11-29 MED ORDER — SIMVASTATIN 20 MG PO TABS
20.0000 mg | ORAL_TABLET | Freq: Every day | ORAL | Status: DC
  Administered 2018-11-29 – 2018-12-01 (×3): 20 mg
  Filled 2018-11-29 (×3): qty 1

## 2018-11-29 NOTE — Progress Notes (Signed)
ANTICOAGULATION CONSULT NOTE  Pharmacy Consult:  Heparin Indication: atrial fibrillation and stroke  No Known Allergies  Patient Measurements: Height: 5\' 1"  (154.9 cm) Weight: 102 lb 15.3 oz (46.7 kg) IBW/kg (Calculated) : 47.8 Heparin Dosing Weight: 46kg  Vital Signs: Temp: 97.9 F (36.6 C) (09/09 1943) Temp Source: Axillary (09/09 1943) BP: 121/49 (09/09 1943) Pulse Rate: 70 (09/09 1943)  Labs: Recent Labs    11/27/18 0704 11/28/18 0320 11/29/18 0610 11/29/18 0742 11/29/18 1832  HGB 10.1* 10.2*  --  10.4*  --   HCT 31.3* 31.7*  --  32.6*  --   PLT 251 271  --  301  --   APTT  --   --  52*  --  110*  HEPARINUNFRC  --   --  >2.20*  --   --   CREATININE 0.94 0.87  --  0.94  --     Estimated Creatinine Clearance: 37.5 mL/min (by C-G formula based on SCr of 0.94 mg/dL).   Assessment: 40 YOF with new Afib and CVA s/p tPA on 11/20/18. The patient was started on heparin initially, then transitioned to apixaban.  Now plan for PEG tube so will transition back to heparin with low end of goal and avoid bolusing given recent stroke.Given recent apixaban dosing, will monitor aPTT and heparin level until correlate.    aPTT level this evening is SUPRAtherapeutic after a rate increase earlier today (aPTT 110 << 52, goal of 66-85). No issues reported per discussion with RN.   Goal of Therapy:  APTT 66-85 sec Heparin level 0.3-0.5 units/ml Monitor platelets by anticoagulation protocol: Yes   Plan:  - Decrease Heparin to 850 units/hr (8.5 ml/hr) - Daily heparin level, aPTT and CBC - F/U with restarting apixaban post PEG - Will continue to monitor for any signs/symptoms of bleeding and will follow up with aPTT level in 8 hours  Thank you for allowing pharmacy to be a part of this patient's care.  Alycia Rossetti, PharmD, BCPS Clinical Pharmacist Clinical phone for 11/29/2018: [phone] 11/29/2018 7:54 PM   **Pharmacist phone directory can now be found on [email or web address] ([email or web address]).   Listed under [place].

## 2018-11-29 NOTE — Progress Notes (Signed)
Wheelchair:  Patient suffers from stroke which impairs their ability to perform daily activities like bathing, dressing, grooming and toileting in the home. A walker will not resolve  issue with performing activities of daily living. A wheelchair will allow patient to safely perform daily activities. Patient is not able to propel themselves in the home using a standard weight wheelchair due to arm weakness, endurance and general weakness. Patient can self propel in the lightweight wheelchair. Length of need Lifetime.  Accessories: elevating leg rests (ELRs), wheel locks, extensions and anti-tippers.  Hospital Bed:  Patient is status post stroke which requires her head to be positioned in ways not feasible with a normal bed. Her head must be elevated at least 30 degrees or more for her tube feedings or she will aspirate. Due to her stroke and tube feeding needs she requires frequent and immediate changes in body position which can not be achieved with a normal bed.

## 2018-11-29 NOTE — Progress Notes (Signed)
ANTICOAGULATION CONSULT NOTE  Pharmacy Consult:  Heparin Indication: atrial fibrillation and stroke  No Known Allergies  Patient Measurements: Height: 5\' 1"  (154.9 cm) Weight: 102 lb 15.3 oz (46.7 kg) IBW/kg (Calculated) : 47.8 Heparin Dosing Weight: 46kg  Vital Signs: Temp: 97.8 F (36.6 C) (09/09 0335) Temp Source: Axillary (09/09 0335) BP: 140/65 (09/09 0335) Pulse Rate: 70 (09/09 0335)  Labs: Recent Labs    11/27/18 0704 11/28/18 0320 11/29/18 0610 11/29/18 0742  HGB 10.1* 10.2*  --  10.4*  HCT 31.3* 31.7*  --  32.6*  PLT 251 271  --  301  APTT  --   --  52*  --   HEPARINUNFRC  --   --  >2.20*  --   CREATININE 0.94 0.87  --   --     Estimated Creatinine Clearance: 40.6 mL/min (by C-G formula based on SCr of 0.87 mg/dL).   Assessment: 24 YOF with new Afib and CVA s/p tPA on 11/20/18. The patient was started on heparin initially, then transitioned to apixaban.  Now plan for PEG tube so will transition back to heparin with low end of goal and avoid bolusing given recent stroke.  Given recent apixaban dosing, will monitor aPTT and heparin level until correlate.  Heparin level is elevated as expected and aPTT is sub-therapeutic (likely need time to accumulate).  No bleeding reported.  Goal of Therapy:  APTT 66-85 sec Heparin level 0.3-0.5 units/ml Monitor platelets by anticoagulation protocol: Yes   Plan:  Increase heparin gtt slightly to 950 units/hr Check 8 hr aPTT Daily heparin level, aPTT and CBC F/U with restarting apixaban post PEG  Makalynn Berwanger D. Mina Marble, PharmD, BCPS, [place] 11/29/2018, 8:25 AM

## 2018-11-29 NOTE — TOC Progression Note (Signed)
Transition of Care [facility]) - Progression Note    Patient Details  Name: [name] MRN: [id number] Date of Birth: [birth year]  Transition of Care [facility]) CM/SW Contact  Pollie Friar, RN Phone Number: 11/29/2018, 12:54 PM  Clinical Narrative:    Son has no preference for DME company. CM called and arranged bed and wheelchair, hoyer through [facility]. Will need to also send them the enteral orders once decided upon.  Awaiting PEG placement on Friday.  TOC following for further d/c needs.    Expected Discharge Plan: [place] Barriers to Discharge: Continued Medical Work up  Expected Discharge Plan and Services Expected Discharge Plan: [place] In-house Referral: Clinical Social Work Discharge Planning Services: CM Consult   Living arrangements for the past 2 months: Single Family Home                                       Social Determinants of Health (SDOH) Interventions    Readmission Risk Interventions No flowsheet data found.

## 2018-11-29 NOTE — Progress Notes (Addendum)
Occupational Therapy Treatment Patient Details Name: [name] MRN: [id number] DOB: 02-15-1941 Today's Date: 11/29/2018    History of present illness 78 yo female admitted with R cerebral infarct frontoparietal infarect with failed attempt to revascularization and extubated 9/2 PMH:hx of CAD, MI 20 years ago [facility]?), cardiomyopathy with EF of 30-35% by echo, bilateral carotid artery stenosis with left ICA occlsuion and right carotid endarterectomy (2008), CVA (?2015), HTN, HLD, DM, and mitral valve insufficiency     OT comments  Pt with gradual progress towards OT goals. Pt tolerating therapy session including activity seated EOB and transition OOB to recliner. Pt overall requiring two person totalA for functional transfers. Pt intermittently following simple commands but often requires increased time and multimodal cues to do so. Pt continues to have strong R gaze preference and requires maxA/facilitation to track beyond R visual field or to turn head towards midline. Son present and supportive at start and end of session. Will continue per POC.   Follow Up Recommendations  SNF;Supervision/Assistance - 24 hour    Equipment Recommendations  3 in 1 bedside commode;Wheelchair (measurements OT);Wheelchair cushion (measurements OT);Hospital bed          Precautions / Restrictions Precautions Precautions: Fall Precaution Comments: L hemi, R gaze preference Restrictions Weight Bearing Restrictions: No       Mobility Bed Mobility Overal bed mobility: Needs Assistance Bed Mobility: Rolling;Sidelying to Sit Rolling: Total assist;+2 for physical assistance Sidelying to sit: Total assist;+2 for physical assistance;+2 for safety/equipment       General bed mobility comments: pt able to place RLE in flexion when cued with hand over hand assist provided to reach with RUE to roll towards L, overall no initiation to assist with transitions, requiring assist for LEs and trunk upright    Transfers Overall transfer level: Needs assistance Equipment used: 2 person hand held assist Transfers: Sit to/from [facility] Sit to Stand: Total assist;+2 physical assistance;+2 safety/equipment Stand pivot transfers: Total assist;+2 physical assistance;+2 safety/equipment       General transfer comment: +2 face to face method utilized. pt requires boosting assist with L knee block, requires physical assist to step with RLE and pivot hips towards recliner. performed additional sit<>stand at sink to attempt to engage pt via use of mirror for midline gaze/positioning     Balance Overall balance assessment: Needs assistance Sitting-balance support: Feet supported;Single extremity supported Sitting balance-Leahy Scale: Poor Sitting balance - Comments: pt requires overall maxA to maintain sitting balance, hand over hand assist to reposition RUE to decrease pushing, pt with strong L lean  Postural control: Left lateral lean Standing balance support: Bilateral upper extremity supported;Single extremity supported Standing balance-Leahy Scale: Zero Standing balance comment: max-totalA+2 for standing balance                           ADL either performed or assessed with clinical judgement   ADL Overall ADL's : Needs assistance/impaired Eating/Feeding: NPO Eating/Feeding Details (indicate cue type and reason): NG tube in place                                 Functional mobility during ADLs: Total assistance;+2 for physical assistance;+2 for safety/equipment General ADL Comments: pt tolerating sitting EOB and transfer to recliner during session. positioned recliner in front of sink to utilize mirror for increasing attempts to have pt track towards midline  Cognition Arousal/Alertness: Awake/alert Behavior During Therapy: Flat affect Overall Cognitive Status: Impaired/Different from baseline Area of Impairment:  Attention;Memory;Awareness;Following commands;Problem solving                   Current Attention Level: Focused Memory: Decreased recall of precautions;Decreased short-term memory Following Commands: Follows one step commands with increased time;Follows one step commands inconsistently Safety/Judgement: Decreased awareness of safety;Decreased awareness of deficits Awareness: Intellectual Problem Solving: Slow processing;Decreased initiation;Difficulty sequencing;Requires verbal cues;Requires tactile cues General Comments: pt with strong R gaze preference, poor awareness to L side, following commands intermittently but decreased carryover         Exercises     Shoulder Instructions       General Comments      Pertinent Vitals/ Pain       Pain Assessment: Faces Faces Pain Scale: No hurt Pain Intervention(s): Monitored during session  Home Living                                          Prior Functioning/Environment              Frequency  Min 2X/week        Progress Toward Goals  OT Goals(current goals can now be found in the care plan section)  Progress towards OT goals: Progressing toward goals  Acute Rehab OT Goals Patient Stated Goal: to see Mariah OT Goal Formulation: With patient Time For Goal Achievement: 12/08/18 Potential to Achieve Goals: Good ADL Goals Pt Will Perform Eating: with min assist;with adaptive utensils;sitting Pt Will Perform Grooming: with min assist;sitting;with adaptive equipment Additional ADL Goal #1: pt will complete bed mobility total +2 mod (A) as precursor to adls Additional ADL Goal #2: Pt will complete static sitting 5 minutes min A() as precursor to adls  Plan Discharge plan remains appropriate;Frequency remains appropriate    Co-evaluation    PT/OT/SLP Co-Evaluation/Treatment: Yes Reason for Co-Treatment: Complexity of the patient's impairments (multi-system involvement);For patient/therapist  safety;To address functional/ADL transfers   OT goals addressed during session: ADL's and self-care;Strengthening/ROM      AM-PAC OT "6 Clicks" Daily Activity     Outcome Measure   Help from another person eating meals?: Total Help from another person taking care of personal grooming?: A Lot Help from another person toileting, which includes using toliet, bedpan, or urinal?: Total Help from another person bathing (including washing, rinsing, drying)?: Total Help from another person to put on and taking off regular upper body clothing?: Total Help from another person to put on and taking off regular lower body clothing?: Total 6 Click Score: 7    End of Session Equipment Utilized During Treatment: Gait belt  OT Visit Diagnosis: Unsteadiness on feet (R26.81);Muscle weakness (generalized) (M62.81)   Activity Tolerance Patient tolerated treatment well   Patient Left with call bell/phone within reach;with family/visitor present;in chair;with chair alarm set(chair position in bed )   Nurse Communication Mobility status;Precautions        Time: [id number] OT Time Calculation (min): 42 min  Charges: OT General Charges $OT Visit: 1 Visit OT Treatments $Self Care/Home Management : 8-22 mins $Therapeutic Activity: 8-22 mins  Lou Cal, OT [facility] Pager [phone] Office [phone]    Raymondo Band 11/29/2018, 4:12 PM

## 2018-11-29 NOTE — Progress Notes (Addendum)
Progress Note  Patient Name: [name] Date of Encounter: 11/29/2018  Primary Cardiologist: New- Dr. Meda Coffee   Subjective   Head turned R, minimal verbal response   Inpatient Medications    Scheduled Meds:  aspirin  81 mg Per Tube Daily   chlorhexidine  15 mL Mouth Rinse BID   Chlorhexidine Gluconate Cloth  6 each Topical Daily   free water  200 mL Per Tube Q4H   hydrochlorothiazide  12.5 mg Oral Daily   insulin aspart  1-3 Units Subcutaneous Q4H   irbesartan  300 mg Per Tube Daily   mouth rinse  15 mL Mouth Rinse q12n4p   metFORMIN  850 mg Oral BID WC   metoprolol tartrate  50 mg Per Tube BID   pantoprazole sodium  40 mg Per Tube QHS   sennosides  5 mL Per Tube QHS   simvastatin  20 mg Oral QHS   Continuous Infusions:  [START ON 12/01/2018]  ceFAZolin (ANCEF) IV     feeding supplement (OSMOLITE 1.2 CAL) 1,000 mL (11/28/18 1108)   heparin 950 Units/hr (11/29/18 0831)   PRN Meds: acetaminophen, dicyclomine, senna-docusate   Vital Signs    Vitals:   11/28/18 1953 11/28/18 2334 11/29/18 0335 11/29/18 0840  BP: 134/64 (!) 142/67 140/65 135/67  Pulse: 81 80 70 80  Resp: 18 18 13 20   Temp: 98 F (36.7 C) 97.7 F (36.5 C) 97.8 F (36.6 C) 99.2 F (37.3 C)  TempSrc: Axillary Axillary Axillary Oral  SpO2: 98% 100% 100% 98%  Weight:   46.7 kg   Height:        Intake/Output Summary (Last 24 hours) at 11/29/2018 0923 Last data filed at 11/29/2018 0336 Gross per 24 hour  Intake 15.99 ml  Output 1701 ml  Net -1685.01 ml   Filed Weights   11/27/18 0335 11/28/18 0513 11/29/18 0335  Weight: 47.6 kg 47.7 kg 46.7 kg    Physical Exam   General: Frail, elderly, female in no acute distress Head: Eyes PERRLA, no new deficits, atraumatic Lungs: Clear bilaterally to auscultation. Heart: HRRR S1 S2, without rub or gallop. Soft murmur.  Pulses are 2+ & equal. No JVD. Abdomen: Bowel sounds are present, abdomen soft and non-tender without masses or   hernias noted. Msk: L sided weakness Extremities: No clubbing, cyanosis or edema.    Skin:  No rashes or lesions noted. Neuro: Alert and oriented X 2.  Labs    Chemistry Recent Labs  Lab 11/27/18 0704 11/28/18 0320 11/29/18 0742  NA 138 140 134*  K 4.3 4.4 4.1  CL 103 104 98  CO2 25 24 25   GLUCOSE 180* 145* 224*  BUN 17 18 19   CREATININE 0.94 0.87 0.94  CALCIUM 8.5* 9.2 8.7*  GFRNONAA 59* >60 59*  GFRAA >60 >60 >60  ANIONGAP 10 12 11      Hematology Recent Labs  Lab 11/27/18 0704 11/28/18 0320 11/29/18 0742  WBC 6.8 7.7 9.5  RBC 3.56* 3.68* 3.76*  HGB 10.1* 10.2* 10.4*  HCT 31.3* 31.7* 32.6*  MCV 87.9 86.1 86.7  MCH 28.4 27.7 27.7  MCHC 32.3 32.2 31.9  RDW 16.8* 16.7* 16.6*  PLT 251 271 301     Radiology    Dg Swallowing Func-speech Pathology  Result Date: 11/28/2018 Objective Swallowing Evaluation: Type of Study: MBS-Modified Barium Swallow Study  Patient Details Name: [name] MRN: [id number] Date of Birth: [birth year] Today's Date: 11/28/2018 Time: SLP Start Time (ACUTE ONLY): [id number] -SLP  Stop Time (ACUTE ONLY): 1239 SLP Time Calculation (min) (ACUTE ONLY): 24 min Past Medical History: Past Medical History: Diagnosis Date  Arthritis   CAD (coronary artery disease)   Carotid artery occlusion   Diabetes mellitus   Hyperlipidemia   Hypertension   Myocardial infarction [facility])  Past Surgical History: Past Surgical History: Procedure Laterality Date  CAROTID ENDARTERECTOMY  01/06/2007  right - with Dacron patch angioplasty  HERNIA REPAIR  2012  IR CT HEAD LTD  11/20/2018  IR PERCUTANEOUS ART THROMBECTOMY/INFUSION INTRACRANIAL INC DIAG ANGIO  11/20/2018  RADIOLOGY WITH ANESTHESIA N/A 11/20/2018  Procedure: CODE STROKE;  Surgeon: Luanne Bras, MD;  Location: [place];  Service: Radiology;  Laterality: N/A; HPI: [name] is a 78 y.o. F with PMH of CAD and MI, HTN, HL, cardiomyopathy, Type II DM, and bilateral carotid artery stenosis s/p right carotid  endarterectomy in 2008. Pt was found by her son 8/31 in bed with flaccid L side, L facial droop and slurred speech; prior level of functioning was independent. MRI revealed R middle cerebral infarct affecting the insula and frontoparietal brain, CXR normal. Pt was intubated upon arrival on 8/31, extubated 9/1, and is currently on room air.  No data recorded Assessment / Plan / Recommendation CHL IP CLINICAL IMPRESSIONS 11/28/2018 Clinical Impression Pt was awake for study with periods of drowsiness requiring tactile stimulation intermittently. She was unable to manipulate, control or transit honey thick, puree or thin barium trials. Only one swallow was initiated throughout the study which appeared to be her saliva without barium/bolus. Barium remained on anterior tongue, anterior and left sulci and leaked from left side oral cavity despite multiple dry spoon presentations, tactile stimulation and verbal cueing. Barium was suctioned from oral cavity and cleaned with a toothette sponge. Her deconditioning has increased each day ST has worked with pt and overall prognosis is unknown. Palliative care may be helpful for decision making in regards to nutrition.    SLP Visit Diagnosis Dysphagia, oral phase (R13.11) Attention and concentration deficit following -- Frontal lobe and executive function deficit following -- Impact on safety and function Severe aspiration risk   CHL IP TREATMENT RECOMMENDATION 11/28/2018 Treatment Recommendations Therapy as outlined in treatment plan below   Prognosis 11/28/2018 Prognosis for Safe Diet Advancement Guarded Barriers to Reach Goals Severity of deficits Barriers/Prognosis Comment -- CHL IP DIET RECOMMENDATION 11/28/2018 SLP Diet Recommendations NPO Liquid Administration via -- Medication Administration Via alternative means Compensations -- Postural Changes --   CHL IP OTHER RECOMMENDATIONS 11/28/2018 Recommended Consults -- Oral Care Recommendations Oral care QID Other Recommendations --    CHL IP FOLLOW UP RECOMMENDATIONS 11/28/2018 Follow up Recommendations Skilled Nursing facility   [facility] IP FREQUENCY AND DURATION 11/28/2018 Speech Therapy Frequency (ACUTE ONLY) min 1 x/week Treatment Duration 1 week      CHL IP ORAL PHASE 11/28/2018 Oral Phase Impaired Oral - Pudding Teaspoon -- Oral - Pudding Cup -- Oral - Honey Teaspoon -- Oral - Honey Cup Holding of bolus;Pocketing in anterior sulcus;Left anterior bolus loss Oral - Nectar Teaspoon -- Oral - Nectar Cup -- Oral - Nectar Straw -- Oral - Thin Teaspoon -- Oral - Thin Cup Left anterior bolus loss;Holding of bolus Oral - Thin Straw Other (Comment) Oral - Puree Holding of bolus;Pocketing in anterior sulcus;Left anterior bolus loss Oral - Mech Soft -- Oral - Regular -- Oral - Multi-Consistency -- Oral - Pill -- Oral Phase - Comment --  CHL IP PHARYNGEAL PHASE 11/28/2018 Pharyngeal Phase (No Data) Pharyngeal- Pudding Teaspoon --  Pharyngeal -- Pharyngeal- Pudding Cup -- Pharyngeal -- Pharyngeal- Honey Teaspoon -- Pharyngeal -- Pharyngeal- Honey Cup -- Pharyngeal -- Pharyngeal- Nectar Teaspoon -- Pharyngeal -- Pharyngeal- Nectar Cup -- Pharyngeal -- Pharyngeal- Nectar Straw -- Pharyngeal -- Pharyngeal- Thin Teaspoon -- Pharyngeal -- Pharyngeal- Thin Cup -- Pharyngeal -- Pharyngeal- Thin Straw -- Pharyngeal -- Pharyngeal- Puree -- Pharyngeal -- Pharyngeal- Mechanical Soft -- Pharyngeal -- Pharyngeal- Regular -- Pharyngeal -- Pharyngeal- Multi-consistency -- Pharyngeal -- Pharyngeal- Pill -- Pharyngeal -- Pharyngeal Comment --  CHL IP CERVICAL ESOPHAGEAL PHASE 11/28/2018 Cervical Esophageal Phase (No Data) Pudding Teaspoon -- Pudding Cup -- Honey Teaspoon -- Honey Cup -- Nectar Teaspoon -- Nectar Cup -- Nectar Straw -- Thin Teaspoon -- Thin Cup -- Thin Straw -- Puree -- Mechanical Soft -- Regular -- Multi-consistency -- Pill -- Cervical Esophageal Comment -- [place] Siren 11/28/2018, 2:24 PM  Orbie Pyo Litaker M.Ed Actor Pager  [phone] Office [phone]              Telemetry    SR, PVCs, rare junctional beats- Personally Reviewed  ECG    11/24/2018 AF with rate 112- Personally Reviewed  Cardiac Studies   Echocardiogram 11/21/2018:  1. Akinesis of the left ventricular, entire inferior wall and inferolateral wall. 2. The left ventricle has moderately reduced systolic function, with an ejection fraction of 35-40%. The cavity size was normal. There is mild concentric left ventricular hypertrophy. Left ventricular diastolic Doppler parameters are consistent with  pseudonormalization. Elevated mean left atrial pressure. 3. The right ventricle has normal systolic function. The cavity was normal. There is no increase in right ventricular wall thickness. Right ventricular systolic pressure could not be assessed. 4. Left atrial size was mildly dilated. 5. There is mild mitral annular calcification present. 6. The aorta is normal unless otherwise noted.  Patient Profile     78 y.o. female with a hx of CAD, MI 20 years ago, cardiomyopathy with EF of 30-35% by echo, bilateral carotid artery stenosis with left ICA occlsuion and right carotid endarterectomy (2008),CVA, HTN, HLD, DM, and mitral valve insufficiencywho is being seen for the evaluation of atrial fibrillation.She saw vascular surgery in 2018 for carotid artery stenosis. She has complete occlusion of her left ICA and 40-59% stenosis in the R ICA. No intervention at that time. 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 Notes reveal an echocardiogram with an EF of 30%, unknown if this is ischemic in nature. Per son she was on anticoagulation for a while but does not know when it was discontinued. He states that she has been fully active taking care of her grand daughter, driving,cooking and full independent until Monday when he found her down laying in her bed, she presented to [facility] with left-sided neglect and eye deviation to the right.  CT head with R M1 occlusion and family decided to proceed with thrombectomy/TPA. She was hypertensive and tachycardic. She was intubated prior to the procedure. Unfortunately, revascularization was unsuccessful.  Assessment & Plan    1. Atrial fibrillation with RVR: - CHA2DS2-VASc = 8 (age x 2, female, CVA x 2, DM, HTN, CAD) - on ASA, off Eliquis right now for PEG tube - in SR, uptitrate BB as BP will allow with PVCs and short 3-5 bt runs NSVT - for now, continue Lopressor 50 mg bid (Cardizem d/c'd w/ low EF)  2. Cardiomyopathy: - EF 09/01 35-40% - wt 103 today, has been 104-105 last 5 days - on BB, ARB -  was on Lasix 20 mg qod pta plus Entresto 49-51>>not on either now - SBP 130s-150s last 24 hr  3. Right MCA stroke: - R M1 occlusion>>thrombectomy/TPA (unsuccessful) - per IM, Neuro - PEG tube planned - permissive HTN right now  SignedRosaria Ferries, PA-C 11/29/2018 9:23 AM Beeper [phone]   For questions or updates, please contact   Please consult [email or web address] for contact info under Cardiology/STEMI.  Personally seen and examined. Agree with above.   Post stroke.  Brief atrial fibrillation with rapid ventricular response, off the Eliquis for PEG tube continuing with metoprolol 50 mg twice a day.  Has an EF of approximately 35 to 40% and her weight has been stable.  No evidence of crackles on exam.  Both her Lasix 20 mg every other day and Entresto 49/51 are on hold in the setting of her stroke.  Her blood pressures have been running in the 130s to 150s for permissive hypertension.  Once it is reasonable from a neurologic perspective, I would recommend restarting the Entresto at half the dose 24/26.  Candee Furbish, MD

## 2018-11-29 NOTE — Progress Notes (Signed)
Physical Therapy Treatment Patient Details Name: [name] MRN: [id number] DOB: Dec 10, 1940 Today's Date: 11/29/2018    History of Present Illness 78 yo female admitted with R cerebral infarct frontoparietal infarect with failed attempt to revascularization and extubated 9/2 PMH:hx of CAD, MI 20 years ago [facility]?), cardiomyopathy with EF of 30-35% by echo, bilateral carotid artery stenosis with left ICA occlsuion and right carotid endarterectomy (2008), CVA (?2015), HTN, HLD, DM, and mitral valve insufficiency      PT Comments    Patient seen for mobility progression. This session focused on sitting balance and functional transfer training while attempting to track to midline. Pt requires max/total A +2 for all mobility this session. Pt continues to present with R gaze preference and L hemiplegia/inattention. Continue to progress as tolerated with anticipated d/c to SNF for further skilled PT services.     Follow Up Recommendations  SNF;Supervision/Assistance - 24 hour     Equipment Recommendations  Other (comment)(TBD)    Recommendations for Other Services       Precautions / Restrictions Precautions Precautions: Fall Precaution Comments: L hemi, R gaze preference Restrictions Weight Bearing Restrictions: No    Mobility  Bed Mobility Overal bed mobility: Needs Assistance Bed Mobility: Rolling;Sidelying to Sit Rolling: Total assist;+2 for physical assistance Sidelying to sit: Total assist;+2 for physical assistance;+2 for safety/equipment       General bed mobility comments: pt able to place RLE in flexion when cued with hand over hand assist provided to reach with RUE to roll towards L, overall no initiation to assist with transitions, requiring assist for LEs and trunk upright   Transfers Overall transfer level: Needs assistance Equipment used: 2 person hand held assist Transfers: Sit to/from [facility] Sit to Stand: Total assist;+2 physical  assistance;+2 safety/equipment Stand pivot transfers: Total assist;+2 physical assistance;+2 safety/equipment       General transfer comment: +2 face to face method utilized. pt requires boosting assist with L knee block, requires physical assist to step with RLE and pivot hips towards recliner. performed additional sit<>stand at sink to attempt to engage pt via use of mirror for midline gaze/positioning   Ambulation/Gait                 Stairs             Wheelchair Mobility    Modified Rankin (Stroke Patients Only)       Balance Overall balance assessment: Needs assistance Sitting-balance support: Feet supported;Single extremity supported Sitting balance-Leahy Scale: Poor Sitting balance - Comments: pt requires overall maxA to maintain sitting balance, hand over hand assist to reposition RUE to decrease pushing, pt with strong L lean  Postural control: Left lateral lean Standing balance support: Bilateral upper extremity supported;Single extremity supported Standing balance-Leahy Scale: Zero Standing balance comment: max-totalA+2 for standing balance                            Cognition Arousal/Alertness: Awake/alert Behavior During Therapy: Flat affect Overall Cognitive Status: Impaired/Different from baseline Area of Impairment: Attention;Memory;Awareness;Following commands;Problem solving                   Current Attention Level: Focused Memory: Decreased recall of precautions;Decreased short-term memory Following Commands: Follows one step commands with increased time;Follows one step commands inconsistently Safety/Judgement: Decreased awareness of safety;Decreased awareness of deficits Awareness: Intellectual Problem Solving: Slow processing;Decreased initiation;Difficulty sequencing;Requires verbal cues;Requires tactile cues General Comments: pt with strong R gaze  preference, poor awareness to L side, following commands intermittently  but decreased carryover       Exercises      General Comments        Pertinent Vitals/Pain Pain Assessment: Faces Faces Pain Scale: No hurt Pain Intervention(s): Monitored during session    Home Living                      Prior Function            PT Goals (current goals can now be found in the care plan section) Acute Rehab PT Goals Patient Stated Goal: to see Alver Fisher Progress towards PT goals: Progressing toward goals    Frequency    Min 3X/week      PT Plan Current plan remains appropriate    Co-evaluation PT/OT/SLP Co-Evaluation/Treatment: Yes Reason for Co-Treatment: Complexity of the patient's impairments (multi-system involvement);For patient/therapist safety;To address functional/ADL transfers PT goals addressed during session: Mobility/safety with mobility OT goals addressed during session: ADL's and self-care;Strengthening/ROM      AM-PAC PT "6 Clicks" Mobility   Outcome Measure  Help needed turning from your back to your side while in a flat bed without using bedrails?: Total Help needed moving from lying on your back to sitting on the side of a flat bed without using bedrails?: Total Help needed moving to and from a bed to a chair (including a wheelchair)?: Total Help needed standing up from a chair using your arms (e.g., wheelchair or bedside chair)?: Total Help needed to walk in hospital room?: Total Help needed climbing 3-5 steps with a railing? : Total 6 Click Score: 6    End of Session Equipment Utilized During Treatment: Gait belt Activity Tolerance: Patient tolerated treatment well Patient left: in chair;with call bell/phone within reach;with chair alarm set Nurse Communication: Mobility status PT Visit Diagnosis: Hemiplegia and hemiparesis;Difficulty in walking, not elsewhere classified (R26.2) Hemiplegia - Right/Left: Left Hemiplegia - dominant/non-dominant: Non-dominant Hemiplegia - caused by: Cerebral infarction      Time: [id number] PT Time Calculation (min) (ACUTE ONLY): 39 min  Charges:  $Gait Training: 8-22 mins                     Earney Navy, PTA Acute Rehabilitation Services Pager: [phone] Office: [phone]     Darliss Cheney 11/29/2018, 4:21 PM

## 2018-11-29 NOTE — Progress Notes (Addendum)
STROKE TEAM PROGRESS NOTE   INTERVAL HISTORY Pt son at bedside. Pt sitting in bed, no neuro changes. Still has right gaze and left hemiplegia. Severe dysarthria. Pending PEG Friday.     Vitals:   11/28/18 2334 11/29/18 0335 11/29/18 0840 11/29/18 1146  BP: (!) 142/67 140/65 135/67 132/62  Pulse: 80 70 80 65  Resp: 18 13 20 16   Temp: 97.7 F (36.5 C) 97.8 F (36.6 C) 99.2 F (37.3 C) 99.1 F (37.3 C)  TempSrc: Axillary Axillary Oral Oral  SpO2: 100% 100% 98% 100%  Weight:  46.7 kg    Height:        CBC:  Recent Labs  Lab 11/28/18 0320 11/29/18 0742  WBC 7.7 9.5  HGB 10.2* 10.4*  HCT 31.7* 32.6*  MCV 86.1 86.7  PLT 271 [id number]    Basic Metabolic Panel:  Recent Labs  Lab 11/25/18 0446 11/26/18 0503 11/27/18 0704 11/28/18 0320 11/29/18 0742  NA 139 139 138 140 134*  K 3.5 3.1* 4.3 4.4 4.1  CL 107 103 103 104 98  CO2 19* 25 25 24 25   GLUCOSE 211* 188* 180* 145* 224*  BUN 19 16 17 18 19   CREATININE 0.97 0.91 0.94 0.87 0.94  CALCIUM 8.3* 8.5* 8.5* 9.2 8.7*  MG 1.9  --  1.8  --   --   PHOS 2.1* 3.0  --   --   --    Lipid Panel:     Component Value Date/Time   CHOL 124 11/21/2018 0538   TRIG 106 11/23/2018 0221   HDL 39 (L) 11/21/2018 0538   CHOLHDL 3.2 11/21/2018 0538   VLDL 13 11/21/2018 0538   LDLCALC 72 11/21/2018 0538   HgbA1c:  Lab Results  Component Value Date   HGBA1C 6.6 (H) 11/21/2018    IMAGING  Ct Head Code Stroke Wo Contrast 11/20/2018 1. Subtle hyperdense right MCA. There is occlusion of the right distal M1 on CTA today. 2. ASPECTS is 10 3. No acute infarct or hemorrhage   Ct Angio Head W Or Wo Contrast Ct Angio Neck W Or Wo Contrast 11/20/2018 1. Occlusion right M1 segment distally with poor flow in the right M2 and M3 branches. 2. Postop right carotid endarterectomy 2008. Right carotid bifurcation widely patent 3. Severe atherosclerotic disease left carotid bifurcation with occlusion of the left internal carotid artery. 4. Left anterior  and left middle cerebral arteries appear patent without significant stenosis through collateral flow through the anterior communicating artery. 5. Posterior circulation intact    Cerebral Angiogram 11/20/2018 1.occluded Rt MCA distal M1 seg. Unsuccessful revascularization Despite x 2 passes with 84mm xc 52mm embotrap retriever device and penumbra aspiration  due to underlying hard ICAD. Unsuccessful intracranial rescue stenting dut vascular tortuosity.  Mr Brain Wo Contrast 11/21/2018 8 cm confluent region of infarction in the right middle cerebral artery territory affecting the insula and frontoparietal brain. Mild swelling but no evidence of hemorrhage at this time or of significant mass effect or any shift. Old infarctions within the right cerebellum, left occipital lobe and at the left frontoparietal vertex.   Ct Head  Wo Contrast 11/25/2018 Subacute large [place] T MCA territory infarct with 3 mm of RIGHT to LEFT midline shift, new since 11/20/2018 but consistent with large subacute infarct. Underlying atrophy with small vessel chronic ischemic changes of deep cerebral white matter. No additional new intracranial abnormalities.  Dg Chest Port 1 View 11/20/2018 1. The endotracheal tube is borderline low. Repositioning should be considered. 2.  Cardiomegaly with mild vascular congestion.  11/21/2018 Endotracheal tube in satisfactory position. No acute abnormality noted.  11/25/2018 1. Interval extubation and placement of an enteric tube coursing below the diaphragm with the distal tip out of field of view. 2. Chronic bilateral increased bronchial markings likely chronic bronchitis. No definite new focal infiltrate. 3. Small area of lucency at the right lung base is likely artifactual. Consider repeat radiograph, preferably PA and lateral radiographs, to exclude small pneumothorax.  2D Echocardiogram  1. Akinesis of the left ventricular, entire inferior wall and inferolateral wall.  2. The left  ventricle has moderately reduced systolic function, with an ejection fraction of 35-40%. The cavity size was normal. There is mild concentric left ventricular hypertrophy. Left ventricular diastolic Doppler parameters are consistent with pseudonormalization. Elevated mean left atrial pressure.  3. The right ventricle has normal systolic function. The cavity was normal. There is no increase in right ventricular wall thickness. Right ventricular systolic pressure could not be assessed.  4. Left atrial size was mildly dilated.  5. There is mild mitral annular calcification present.  6. The aorta is normal unless otherwise noted.  EEG - evidence of a right hemispheric dysfunction which is consistent with the patient's known right hemispheric infarct.   PHYSICAL EXAM     Elderly African-American lady awake alert in chair. Afebrile. Head is nontraumatic. Neck is supple without bruit.    Cardiac exam no murmur or gallop. Lungs with coarse rhonchi. Distal pulses are well felt.  Neurological Exam : Patient is awake alert sitting in chair, severe dysarthria but orientated to place, age, self and year, but not to month, followed most simple commands.  She has right gaze deviation.  She is not able to follow gaze to the left.  She blinks to threat inconsistently bilaterally.  Pupils equal reactive.  Fundi not visualized. left facial droop. cortrak tube in place. She does move right upper and lower extremity against gravity.  She withdraws left lower extremity mildly to pain.  Does not withdraw left upper extremity. Tone is diminished on the left and normal on the right.  Right plantars downgoing left is equivocal.  ASSESSMENT/PLAN Ms. KEIANA LUEDKE is a 78 y.o. female with history of myocardial infarction, hypertension, hyperlipidemia, diabetes, carotid artery occlusion on the left, CAD and arthritis presenting with L sided neglect, R eye deviation and L side flaccid. Received tPA 11/20/2018 at 1545. CTA  showed R M1 occlusion. Taken to IR.  SBP > 220.   Stroke: R MCA insular/frontoparietal infarct due to right ICA and MCA occlusion s/p tPA and unsuccessful IR - embolic likely from new diagnosed AF  Code Stroke CT head subtle hyperdense R MCA.     CTA head & neck R M1 Occlusion w/ poor flor R M2 and R M3. R CEA 2008 w/ patent bifurcation. Severe L ICA bifurcation atherosclerosis w/ occlusion L ICA.   Cerebral angio occluded R M1. Unsuccessful revascularization after 2 passes w/ embotrap and penumbra aspiration d/t underlying hard atherosclerosis and vascular tortuosity.  Post IR CT no ICH or mass effect  MRI R MCA infarct insular and frontoparietal. Old R cerebellar, L occipital, L frontoparietal vertex infarcts.  CT repeat 9/5 right large infarct with 81mm MLS, no hemorrhage  2D Echo EF 35-40%. LA mildly dilated. No source of embolus   LDL 72  HgbA1c 6.6   EEG 11/25/18 - evidence of a right hemispheric dysfunction which is consistent with the patient's known right hemispheric infarct.  Eliquis for VTE  prophylaxis  aspirin 81 mg daily and pletal prior to admission, now on aspirin 81mg  and heparin IV - Eliquis on hold for PEG later this week.   Therapy recommendations:  SNF - son wants to take home with [facility]. HH ordered. Equipment (bed, hoyer, w/c) ordered  Disposition:  pending  (lives alone, son checks on daily)  Acute Respiratory Failure w/ hypoxia  Intubated for IR, not extubated at the end of the procedure d/t tPA administration and sheath left in  Sheath now out  Extubated 9/2   Cheyne stokes breathing pattern 11/25/18   Much improved   Atrial Fibrillation w/ RVR, confirmed, new dx PSVT  telemetry monitoring showed AF    Treated with metoprolol  Cardiology consulted, appreciate help  Off cardizem givem cardiomyopathy    Metoprolol 50 mg bid  Eliquis on hold for PEG later this week. pharmacy managing bridge with IV heparin    Dysphagia  Secondary to  stroke  Failed MBSS, did poorly with repeat MBSS  On TF @ 50  On free water 200 Q4  SLP on board  Son discussed with pt and OK with PEG  Trauma consulted.  Hold eliquis - bridge w/ IV heparin until eliquis can be resumed  Carotid Disease  L ICA occlusion  Hx of right CEA 2008  Hypertensive Urgency  SBP > 220 in ED, as high as 222/128  Home meds:  norvasc 10, clonidine 0.1 bid, labetalol 200 bid, valsartan 320-12.5 daily  Treated w/ cleviprex, Now off.   Now on avapro, metoprolol, cardizem  BP stable  Long-term BP goal 130-150 given right ICA occlusion  Hyperlipidemia  Home meds:  zocor 20  LDL 72, goal < 70  Resume zocor 20  Continue statin at discharge  Diabetes type II Controlled  Home meds:  metformin 500  HgbA1c 6.6, goal < 7.0  CBGs  SSI  DB RN on baord  Mild hyperglycemia  On metformin 850mg  bid   Other Stroke Risk Factors  Advanced age  Former Cigarette smoker  Coronary artery disease s/p ? MI 20 yrs ago. No noted cardiac cath    Cardiomyopathy, euvolemic  - on irbesartan and BB. Of lasix and enestro. Recommend restart enestro at half dose when ok w/ neuro.  F/u w/ cardiology  Other Active Problems  R DP and PT not dopplerable prior to or post IR  Acute blood loss anemia 13.9->9.2->10.4->10.1->10.2  Urinary retention s/p multiple I&Os - on foley, UA neg - d/c foley today and monitoring with bladder scan  Hypokalemia - K 3.1-> supplement ->4.3->4.4   Hospital day # 9   Rosalin Hawking, MD PhD Stroke Neurology 11/29/2018 2:55 PM   To contact Stroke Continuity provider, please refer to [email or web address] After hours, contact General Neurology

## 2018-11-30 DIAGNOSIS — D62 Acute posthemorrhagic anemia: Secondary | ICD-10-CM

## 2018-11-30 DIAGNOSIS — R339 Retention of urine, unspecified: Secondary | ICD-10-CM

## 2018-11-30 LAB — GLUCOSE, CAPILLARY
Glucose-Capillary: 152 mg/dL — ABNORMAL HIGH (ref 70–99)
Glucose-Capillary: 163 mg/dL — ABNORMAL HIGH (ref 70–99)
Glucose-Capillary: 165 mg/dL — ABNORMAL HIGH (ref 70–99)
Glucose-Capillary: 176 mg/dL — ABNORMAL HIGH (ref 70–99)
Glucose-Capillary: 177 mg/dL — ABNORMAL HIGH (ref 70–99)
Glucose-Capillary: 179 mg/dL — ABNORMAL HIGH (ref 70–99)

## 2018-11-30 LAB — BASIC METABOLIC PANEL
Anion gap: 12 (ref 5–15)
BUN: 21 mg/dL (ref 8–23)
CO2: 26 mmol/L (ref 22–32)
Calcium: 8.9 mg/dL (ref 8.9–10.3)
Chloride: 99 mmol/L (ref 98–111)
Creatinine, Ser: 0.93 mg/dL (ref 0.44–1.00)
GFR calc Af Amer: >60 mL/min (ref >60)
GFR calc non Af Amer: 60 mL/min — ABNORMAL LOW (ref >60)
Glucose, Bld: 118 mg/dL — ABNORMAL HIGH (ref 70–99)
Potassium: 4 mmol/L (ref 3.5–5.1)
Sodium: 137 mmol/L (ref 135–145)

## 2018-11-30 LAB — CBC
HCT: 32.3 % — ABNORMAL LOW (ref 36.0–46.0)
Hemoglobin: 10.4 g/dL — ABNORMAL LOW (ref 12.0–15.0)
MCH: 27.8 pg (ref 26.0–34.0)
MCHC: 32.2 g/dL (ref 30.0–36.0)
MCV: 86.4 fL (ref 80.0–100.0)
Platelets: 326 10*3/uL (ref 150–400)
RBC: 3.74 MIL/uL — ABNORMAL LOW (ref 3.87–5.11)
RDW: 16.6 % — ABNORMAL HIGH (ref 11.5–15.5)
WBC: 10.4 10*3/uL (ref 4.0–10.5)
nRBC: 0 % (ref 0.0–0.2)

## 2018-11-30 LAB — APTT: aPTT: 72 seconds — ABNORMAL HIGH (ref 24–36)

## 2018-11-30 LAB — HEPARIN LEVEL (UNFRACTIONATED): Heparin Unfractionated: 1.18 IU/mL — ABNORMAL HIGH (ref 0.30–0.70)

## 2018-11-30 MED ORDER — METFORMIN HCL 850 MG PO TABS
850.0000 mg | ORAL_TABLET | Freq: Two times a day (BID) | ORAL | Status: DC
  Administered 2018-11-30 – 2018-12-02 (×3): 850 mg
  Filled 2018-11-30 (×5): qty 1

## 2018-11-30 MED ORDER — HYDROCHLOROTHIAZIDE 10 MG/ML ORAL SUSPENSION
12.5000 mg | Freq: Every day | ORAL | Status: DC
  Administered 2018-11-30 – 2018-12-02 (×3): 13 mg
  Filled 2018-11-30 (×5): qty 1.88

## 2018-11-30 MED ORDER — SODIUM CHLORIDE 0.9 % IV SOLN
INTRAVENOUS | Status: DC
  Administered 2018-11-30: 23:00:00 via INTRAVENOUS

## 2018-11-30 MED ORDER — SENNOSIDES-DOCUSATE SODIUM 8.6-50 MG PO TABS: 1.0000 | ORAL_TABLET | Freq: Every evening | ORAL | Status: DC | PRN

## 2018-11-30 NOTE — Progress Notes (Signed)
STROKE TEAM PROGRESS NOTE   INTERVAL HISTORY Pt sitting in bed, very lethargic, eyes closed, however, able to answer all simple questions, orientated to place and self and age. Son not at bedside today. Pending PEG in am.     Vitals:   11/29/18 2324 11/30/18 0340 11/30/18 0811 11/30/18 1210  BP: (!) 120/59 126/63 (!) 132/46 (!) 123/53  Pulse: 69 77 67 62  Resp: (!) 22 (!) 22 (!) 24 (!) 24  Temp: 98 F (36.7 C) 98.1 F (36.7 C) 98.9 F (37.2 C) 98.4 F (36.9 C)  TempSrc: Axillary Axillary Oral Oral  SpO2: 96% 98%    Weight:  46 kg    Height:        CBC:  Recent Labs  Lab 11/29/18 0742 11/30/18 0630  WBC 9.5 10.4  HGB 10.4* 10.4*  HCT 32.6* 32.3*  MCV 86.7 86.4  PLT 301 [id number]    Basic Metabolic Panel:  Recent Labs  Lab 11/25/18 0446 11/26/18 0503 11/27/18 0704  11/29/18 0742 11/30/18 0630  NA 139 139 138   < > 134* 137  K 3.5 3.1* 4.3   < > 4.1 4.0  CL 107 103 103   < > 98 99  CO2 19* 25 25   < > 25 26  GLUCOSE 211* 188* 180*   < > 224* 118*  BUN 19 16 17    < > 19 21  CREATININE 0.97 0.91 0.94   < > 0.94 0.93  CALCIUM 8.3* 8.5* 8.5*   < > 8.7* 8.9  MG 1.9  --  1.8  --   --   --   PHOS 2.1* 3.0  --   --   --   --    < > = values in this interval not displayed.   Lipid Panel:     Component Value Date/Time   CHOL 124 11/21/2018 0538   TRIG 106 11/23/2018 0221   HDL 39 (L) 11/21/2018 0538   CHOLHDL 3.2 11/21/2018 0538   VLDL 13 11/21/2018 0538   LDLCALC 72 11/21/2018 0538   HgbA1c:  Lab Results  Component Value Date   HGBA1C 6.6 (H) 11/21/2018    IMAGING  Ct Head Code Stroke Wo Contrast 11/20/2018 1. Subtle hyperdense right MCA. There is occlusion of the right distal M1 on CTA today. 2. ASPECTS is 10 3. No acute infarct or hemorrhage   Ct Angio Head W Or Wo Contrast Ct Angio Neck W Or Wo Contrast 11/20/2018 1. Occlusion right M1 segment distally with poor flow in the right M2 and M3 branches. 2. Postop right carotid endarterectomy 2008. Right  carotid bifurcation widely patent 3. Severe atherosclerotic disease left carotid bifurcation with occlusion of the left internal carotid artery. 4. Left anterior and left middle cerebral arteries appear patent without significant stenosis through collateral flow through the anterior communicating artery. 5. Posterior circulation intact    Cerebral Angiogram 11/20/2018 1.occluded Rt MCA distal M1 seg. Unsuccessful revascularization Despite x 2 passes with 47mm xc 7mm embotrap retriever device and penumbra aspiration  due to underlying hard ICAD. Unsuccessful intracranial rescue stenting dut vascular tortuosity.  Mr Brain Wo Contrast 11/21/2018 8 cm confluent region of infarction in the right middle cerebral artery territory affecting the insula and frontoparietal brain. Mild swelling but no evidence of hemorrhage at this time or of significant mass effect or any shift. Old infarctions within the right cerebellum, left occipital lobe and at the left frontoparietal vertex.   Ct Head  Wo  Contrast 11/25/2018 Subacute large [place] T MCA territory infarct with 3 mm of RIGHT to LEFT midline shift, new since 11/20/2018 but consistent with large subacute infarct. Underlying atrophy with small vessel chronic ischemic changes of deep cerebral white matter. No additional new intracranial abnormalities.  Dg Chest Port 1 View 11/20/2018 1. The endotracheal tube is borderline low. Repositioning should be considered. 2. Cardiomegaly with mild vascular congestion.  11/21/2018 Endotracheal tube in satisfactory position. No acute abnormality noted.  11/25/2018 1. Interval extubation and placement of an enteric tube coursing below the diaphragm with the distal tip out of field of view. 2. Chronic bilateral increased bronchial markings likely chronic bronchitis. No definite new focal infiltrate. 3. Small area of lucency at the right lung base is likely artifactual. Consider repeat radiograph, preferably PA and  lateral radiographs, to exclude small pneumothorax.  2D Echocardiogram  1. Akinesis of the left ventricular, entire inferior wall and inferolateral wall.  2. The left ventricle has moderately reduced systolic function, with an ejection fraction of 35-40%. The cavity size was normal. There is mild concentric left ventricular hypertrophy. Left ventricular diastolic Doppler parameters are consistent with pseudonormalization. Elevated mean left atrial pressure.  3. The right ventricle has normal systolic function. The cavity was normal. There is no increase in right ventricular wall thickness. Right ventricular systolic pressure could not be assessed.  4. Left atrial size was mildly dilated.  5. There is mild mitral annular calcification present.  6. The aorta is normal unless otherwise noted.  EEG - evidence of a right hemispheric dysfunction which is consistent with the patient's known right hemispheric infarct.   PHYSICAL EXAM     Elderly African-American lady awake alert in chair. Afebrile. Head is nontraumatic. Neck is supple without bruit.    Cardiac exam no murmur or gallop. Lungs with coarse rhonchi. Distal pulses are well felt.  Neurological Exam : Patient is awake alert sitting in chair, severe dysarthria but orientated to place, age, self and year, but not to month, followed most simple commands.  She has right gaze deviation.  She is not able to follow gaze to the left.  She blinks to threat inconsistently bilaterally.  Pupils equal reactive.  Fundi not visualized. left facial droop. cortrak tube in place. She does move right upper and lower extremity against gravity.  She withdraws left lower extremity mildly to pain.  Does not withdraw left upper extremity. Tone is diminished on the left and normal on the right.  Right plantars downgoing left is equivocal.  ASSESSMENT/PLAN [name] is a 78 y.o. female with history of myocardial infarction, hypertension, hyperlipidemia,  diabetes, carotid artery occlusion on the left, CAD and arthritis presenting with L sided neglect, R eye deviation and L side flaccid. Received tPA 11/20/2018 at 1545. CTA showed R M1 occlusion. Taken to IR.  SBP > 220.   Stroke: R MCA insular/frontoparietal infarct due to right ICA and MCA occlusion s/p tPA and unsuccessful IR - embolic likely from new diagnosed AF  Code Stroke CT head subtle hyperdense R MCA.     CTA head & neck R M1 Occlusion w/ poor flor R M2 and R M3. R CEA 2008 w/ patent bifurcation. Severe L ICA bifurcation atherosclerosis w/ occlusion L ICA.   Cerebral angio occluded R M1. Unsuccessful revascularization after 2 passes w/ embotrap and penumbra aspiration d/t underlying hard atherosclerosis and vascular tortuosity.  Post IR CT no ICH or mass effect  MRI R MCA infarct insular and frontoparietal. Old  R cerebellar, L occipital, L frontoparietal vertex infarcts.  CT repeat 9/5 right large infarct with 28mm MLS, no hemorrhage  2D Echo EF 35-40%. LA mildly dilated. No source of embolus   LDL 72  HgbA1c 6.6   EEG 11/25/18 - evidence of a right hemispheric dysfunction which is consistent with the patient's known right hemispheric infarct.  Eliquis for VTE prophylaxis  aspirin 81 mg daily and pletal prior to admission, now on aspirin 81mg  and heparin IV - Eliquis on hold for PEG tomorrow.   Therapy recommendations:  SNF - son wants to take home with [facility]. HH ordered. Equipment (bed, hoyer, w/c) ordered  Disposition:  pending  (lives alone, son checks on daily)  Acute Respiratory Failure w/ hypoxia  Intubated for IR, not extubated at the end of the procedure d/t tPA administration and sheath left in  Sheath now out  Extubated 9/2   Cheyne stokes breathing pattern 11/25/18   Much improved   Atrial Fibrillation w/ RVR, confirmed, new dx PSVT  telemetry monitoring showed AF    Treated with metoprolol  Cardiology consulted, appreciate help  Off cardizem givem  cardiomyopathy    Metoprolol 50 mg bid  Eliquis on hold for PEG tomorrow. pharmacy managing bridge with IV heparin    Dysphagia  Secondary to stroke  Failed MBSS, did poorly with repeat MBSS  On TF @ 50 - hold off after midnight  On free water 200 Q4 - hold off after midnight  NS @ 40 starting at midnight  SLP on board  Son discussed with pt and OK with PEG  Trauma consulted.  Hold eliquis - bridge w/ IV heparin until eliquis can be resumed  Carotid Disease  L ICA occlusion  Hx of right CEA 2008  Hypertensive Urgency  SBP > 220 in ED, as high as 222/128  Home meds:  norvasc 10, clonidine 0.1 bid, labetalol 200 bid, valsartan 320-12.5 daily  Treated w/ cleviprex, Now off.   Now on avapro, metoprolol, cardizem, HCTZ 13 mg  BP stable  Long-term BP goal 130-150 given right ICA occlusion  Hyperlipidemia  Home meds:  zocor 20  LDL 72, goal < 70  Resume zocor 20  Continue statin at discharge  Diabetes type II Controlled  Home meds:  metformin 500  HgbA1c 6.6, goal < 7.0  CBGs  SSI  DB RN on baord  Mild hyperglycemia  On metformin 850mg  bid   Other Stroke Risk Factors  Advanced age  Former Cigarette smoker  Coronary artery disease s/p ? MI 20 yrs ago. No noted cardiac cath    Cardiomyopathy, euvolemic  - on irbesartan and BB. Of lasix and enestro. Recommend restart enestro at half dose when ok w/ neuro.  F/u w/ cardiology  Other Active Problems  R DP and PT not dopplerable prior to or post IR  Acute blood loss anemia 13.9->9.2->10.4->10.1->10.2->10.4->10.4  Urinary retention s/p multiple I&Os - on foley, UA neg - d/c foley 9/9 and monitoring with bladder scan  Hypokalemia - K 3.1-> supplement ->4.3->4.4->4.1->4.0   Hospital day # 10   Rosalin Hawking, MD PhD Stroke Neurology 11/30/2018 3:16 PM   To contact Stroke Continuity provider, please refer to [email or web address] After hours, contact General Neurology

## 2018-11-30 NOTE — Progress Notes (Signed)
Nutrition Follow-up  DOCUMENTATION CODES:   Severe malnutrition in context of chronic illness  INTERVENTION:  Continue Osmolite 1.2 formula at goal rate of 50 ml/hr.  Free water flushes of 200 ml every 4 hours per MD.   Tube feeding regimen to provide 1440 kcal, 67 grams of protein, and 2112 ml of H2O.  NUTRITION DIAGNOSIS:   Severe Malnutrition related to chronic illness as evidenced by severe muscle depletion, severe fat depletion; ongoing  GOAL:   Patient will meet greater than or equal to 90% of their needs; met with TF  MONITOR:   TF tolerance, Skin, Weight trends, Labs, I & O's  REASON FOR ASSESSMENT:   Consult Enteral/tube feeding initiation and management  ASSESSMENT:   78 year old female with history of CAD, chonic left ICA occlusion and known right ICA stenosis, T2DM, HTN, HLD, MI. Patient found laying in bed by her son with rightward gaze, left-sided facial droop, and dysarthric speech stating that she was unable to get up. Patient admitted with thrombotic R MCA CVA secondary to M1 occlusion, chronic bilateral ICA stenosis   8/31 - s/p cerebral arteriogram with unsuccessful mechanical thrombectomy and unsuccessful intracrainal rescue stenting, intubated 9/02 - extubated 9/04- Cortrak NGT placed. Tip of tube in stomach.   Pt with severe aspiration risk per 9/8 MBS evaluation. Pt continues on NPO status. Plans for PEG placement tomorrow. Pt has been tolerating her tube feedings well. Recommend continuation of current tube feeding regimen once PEG is placed and stable for use.   Labs and medications reviewed.   Diet Order:   Diet Order            Diet NPO time specified  Diet effective midnight              EDUCATION NEEDS:   No education needs have been identified at this time  Skin:  Skin Assessment: Reviewed RN Assessment  Last BM:  9/10  Height:   Ht Readings from Last 1 Encounters:  11/20/18 '5\' 1"'  (1.549 m)    Weight:   Wt Readings  from Last 1 Encounters:  11/30/18 46 kg    Ideal Body Weight:  47.7 kg  BMI:  Body mass index is 19.16 kg/m.  Estimated Nutritional Needs:   Kcal:  1400-1600  Protein:  60-75 grams  Fluid:  >/= 1.4 L    Corrin Parker, MS, RD, LDN Pager # [phone] After hours/ weekend pager # [phone]

## 2018-11-30 NOTE — Progress Notes (Addendum)
Progress Note  Patient Name: [name] Date of Encounter: 11/30/2018  Primary Cardiologist: New- Dr. Meda Coffee   Subjective   Head turned R, more verbal today, moving more  Inpatient Medications    Scheduled Meds:  aspirin  81 mg Per Tube Daily   chlorhexidine  15 mL Mouth Rinse BID   Chlorhexidine Gluconate Cloth  6 each Topical Daily   free water  200 mL Per Tube Q4H   hydrochlorothiazide  13 mg Per Tube Daily   insulin aspart  1-3 Units Subcutaneous Q4H   irbesartan  300 mg Per Tube Daily   mouth rinse  15 mL Mouth Rinse q12n4p   metFORMIN  850 mg Per Tube BID WC   metoprolol tartrate  50 mg Per Tube BID   pantoprazole sodium  40 mg Per Tube QHS   sennosides  5 mL Per Tube QHS   simvastatin  20 mg Per Tube QHS   Continuous Infusions:  [START ON 12/01/2018]  ceFAZolin (ANCEF) IV     feeding supplement (OSMOLITE 1.2 CAL) 1,000 mL (11/28/18 1108)   heparin 850 Units/hr (11/29/18 2019)   PRN Meds: acetaminophen, dicyclomine, senna-docusate   Vital Signs    Vitals:   11/29/18 1943 11/29/18 2324 11/30/18 0340 11/30/18 0811  BP: (!) 121/49 (!) 120/59 126/63 (!) 132/46  Pulse: 70 69 77 67  Resp: 18 (!) 22 (!) 22 (!) 24  Temp: 97.9 F (36.6 C) 98 F (36.7 C) 98.1 F (36.7 C) 98.9 F (37.2 C)  TempSrc: Axillary Axillary Axillary Oral  SpO2: 96% 96% 98%   Weight:   46 kg   Height:        Intake/Output Summary (Last 24 hours) at 11/30/2018 0919 Last data filed at 11/29/2018 1657 Gross per 24 hour  Intake --  Output 1050 ml  Net -1050 ml   Filed Weights   11/28/18 0513 11/29/18 0335 11/30/18 0340  Weight: 47.7 kg 46.7 kg 46 kg    Physical Exam   General: frail, elderly, female in no acute distress Head: Eyes PERRLA Normocephalic and atraumatic Lungs: Clear bilaterally to auscultation. Heart: HRRR S1 S2, without rub or gallop. Soft murmur.  Pulses are 2+ & equal. No JVD. Abdomen: Bowel sounds are present, abdomen soft and non-tender  without masses or  hernias noted. Msk: L weakness and decreased movement. Extremities: No clubbing, cyanosis or edema.    Skin:  No rashes or lesions noted. Neuro: no new deficits Psych:  Good affect, responds appropriately  Labs    Chemistry Recent Labs  Lab 11/28/18 0320 11/29/18 0742 11/30/18 0630  NA 140 134* 137  K 4.4 4.1 4.0  CL 104 98 99  CO2 24 25 26   GLUCOSE 145* 224* 118*  BUN 18 19 21   CREATININE 0.87 0.94 0.93  CALCIUM 9.2 8.7* 8.9  GFRNONAA >60 59* 60*  GFRAA >60 >60 >60  ANIONGAP 12 11 12      Hematology Recent Labs  Lab 11/28/18 0320 11/29/18 0742 11/30/18 0630  WBC 7.7 9.5 10.4  RBC 3.68* 3.76* 3.74*  HGB 10.2* 10.4* 10.4*  HCT 31.7* 32.6* 32.3*  MCV 86.1 86.7 86.4  MCH 27.7 27.7 27.8  MCHC 32.2 31.9 32.2  RDW 16.7* 16.6* 16.6*  PLT 271 301 326     Radiology    Dg Swallowing Func-speech Pathology  Result Date: 11/28/2018 Objective Swallowing Evaluation: Type of Study: MBS-Modified Barium Swallow Study  Patient Details Name: [name] MRN: [id number] Date of  Birth: 10-19-1940 Today's Date: 11/28/2018 Time: SLP Start Time (ACUTE ONLY): 1215 -SLP Stop Time (ACUTE ONLY): 1239 SLP Time Calculation (min) (ACUTE ONLY): 24 min Past Medical History: Past Medical History: Diagnosis Date  Arthritis   CAD (coronary artery disease)   Carotid artery occlusion   Diabetes mellitus   Hyperlipidemia   Hypertension   Myocardial infarction [facility])  Past Surgical History: Past Surgical History: Procedure Laterality Date  CAROTID ENDARTERECTOMY  01/06/2007  right - with Dacron patch angioplasty  HERNIA REPAIR  2012  IR CT HEAD LTD  11/20/2018  IR PERCUTANEOUS ART THROMBECTOMY/INFUSION INTRACRANIAL INC DIAG ANGIO  11/20/2018  RADIOLOGY WITH ANESTHESIA N/A 11/20/2018  Procedure: CODE STROKE;  Surgeon: Luanne Bras, MD;  Location: [facility];  Service: Radiology;  Laterality: N/A; HPI: [name] is a 78 y.o. F with PMH of CAD and MI, HTN, HL, cardiomyopathy,  Type II DM, and bilateral carotid artery stenosis s/p right carotid endarterectomy in 2008. Pt was found by her son 8/31 in bed with flaccid L side, L facial droop and slurred speech; prior level of functioning was independent. MRI revealed R middle cerebral infarct affecting the insula and frontoparietal brain, CXR normal. Pt was intubated upon arrival on 8/31, extubated 9/1, and is currently on room air.  No data recorded Assessment / Plan / Recommendation CHL IP CLINICAL IMPRESSIONS 11/28/2018 Clinical Impression Pt was awake for study with periods of drowsiness requiring tactile stimulation intermittently. She was unable to manipulate, control or transit honey thick, puree or thin barium trials. Only one swallow was initiated throughout the study which appeared to be her saliva without barium/bolus. Barium remained on anterior tongue, anterior and left sulci and leaked from left side oral cavity despite multiple dry spoon presentations, tactile stimulation and verbal cueing. Barium was suctioned from oral cavity and cleaned with a toothette sponge. Her deconditioning has increased each day ST has worked with pt and overall prognosis is unknown. Palliative care may be helpful for decision making in regards to nutrition.    SLP Visit Diagnosis Dysphagia, oral phase (R13.11) Attention and concentration deficit following -- Frontal lobe and executive function deficit following -- Impact on safety and function Severe aspiration risk   CHL IP TREATMENT RECOMMENDATION 11/28/2018 Treatment Recommendations Therapy as outlined in treatment plan below   Prognosis 11/28/2018 Prognosis for Safe Diet Advancement Guarded Barriers to Reach Goals Severity of deficits Barriers/Prognosis Comment -- CHL IP DIET RECOMMENDATION 11/28/2018 SLP Diet Recommendations NPO Liquid Administration via -- Medication Administration Via alternative means Compensations -- Postural Changes --   CHL IP OTHER RECOMMENDATIONS 11/28/2018 Recommended Consults --  Oral Care Recommendations Oral care QID Other Recommendations --   CHL IP FOLLOW UP RECOMMENDATIONS 11/28/2018 Follow up Recommendations Skilled Nursing facility   [facility] IP FREQUENCY AND DURATION 11/28/2018 Speech Therapy Frequency (ACUTE ONLY) min 1 x/week Treatment Duration 1 week      CHL IP ORAL PHASE 11/28/2018 Oral Phase Impaired Oral - Pudding Teaspoon -- Oral - Pudding Cup -- Oral - Honey Teaspoon -- Oral - Honey Cup Holding of bolus;Pocketing in anterior sulcus;Left anterior bolus loss Oral - Nectar Teaspoon -- Oral - Nectar Cup -- Oral - Nectar Straw -- Oral - Thin Teaspoon -- Oral - Thin Cup Left anterior bolus loss;Holding of bolus Oral - Thin Straw Other (Comment) Oral - Puree Holding of bolus;Pocketing in anterior sulcus;Left anterior bolus loss Oral - Mech Soft -- Oral - Regular -- Oral - Multi-Consistency -- Oral - Pill -- Oral Phase - Comment --  CHL IP PHARYNGEAL PHASE 11/28/2018 Pharyngeal Phase (No Data) Pharyngeal- Pudding Teaspoon -- Pharyngeal -- Pharyngeal- Pudding Cup -- Pharyngeal -- Pharyngeal- Honey Teaspoon -- Pharyngeal -- Pharyngeal- Honey Cup -- Pharyngeal -- Pharyngeal- Nectar Teaspoon -- Pharyngeal -- Pharyngeal- Nectar Cup -- Pharyngeal -- Pharyngeal- Nectar Straw -- Pharyngeal -- Pharyngeal- Thin Teaspoon -- Pharyngeal -- Pharyngeal- Thin Cup -- Pharyngeal -- Pharyngeal- Thin Straw -- Pharyngeal -- Pharyngeal- Puree -- Pharyngeal -- Pharyngeal- Mechanical Soft -- Pharyngeal -- Pharyngeal- Regular -- Pharyngeal -- Pharyngeal- Multi-consistency -- Pharyngeal -- Pharyngeal- Pill -- Pharyngeal -- Pharyngeal Comment --  CHL IP CERVICAL ESOPHAGEAL PHASE 11/28/2018 Cervical Esophageal Phase (No Data) Pudding Teaspoon -- Pudding Cup -- Honey Teaspoon -- Honey Cup -- Nectar Teaspoon -- Nectar Cup -- Nectar Straw -- Thin Teaspoon -- Thin Cup -- Thin Straw -- Puree -- Mechanical Soft -- Regular -- Multi-consistency -- Pill -- Cervical Esophageal Comment -- [name] 11/28/2018, 2:24 PM  [name] M.Ed Actor Pager [phone] Office [phone]              Telemetry    SR, PVCs, short NSVT, A tach vs SVT - Personally Reviewed  ECG    11/24/2018 AF with rate 112- Personally Reviewed  Cardiac Studies   Echocardiogram 11/21/2018:  1. Akinesis of the left ventricular, entire inferior wall and inferolateral wall. 2. The left ventricle has moderately reduced systolic function, with an ejection fraction of 35-40%. The cavity size was normal. There is mild concentric left ventricular hypertrophy. Left ventricular diastolic Doppler parameters are consistent with  pseudonormalization. Elevated mean left atrial pressure. 3. The right ventricle has normal systolic function. The cavity was normal. There is no increase in right ventricular wall thickness. Right ventricular systolic pressure could not be assessed. 4. Left atrial size was mildly dilated. 5. There is mild mitral annular calcification present. 6. The aorta is normal unless otherwise noted.  Patient Profile     78 y.o. female with a hx of CAD, MI 20 years ago, cardiomyopathy with EF of 30-35% by echo, bilateral carotid artery stenosis with left ICA occlsuion and right carotid endarterectomy (2008),CVA, HTN, HLD, DM, and mitral valve insufficiencywho is being seen for the evaluation of atrial fibrillation.She saw vascular surgery in 2018 for carotid artery stenosis. She has complete occlusion of her left ICA and 40-59% stenosis in the R ICA. No intervention at that time. 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 Notes reveal an echocardiogram with an EF of 30%, unknown if this is ischemic in nature. Per son she was on anticoagulation for a while but does not know when it was discontinued. He states that she has been fully active taking care of her grand daughter, driving,cooking and full independent until Monday when he found her down laying in her bed, she  presented to [facility] with left-sided neglect and eye deviation to the right. CT head with R M1 occlusion and family decided to proceed with thrombectomy/TPA. She was hypertensive and tachycardic. She was intubated prior to the procedure. Unfortunately, revascularization was unsuccessful.  Assessment & Plan    1. Atrial fibrillation with RVR: - on ASA, currently off Eliquis for PEG tube - maintaining SR, has PVCs and 3-4 bt runs NSVT - continue BB  2. Cardiomyopathy: - s/p echo 09/01 w/ EF 35-40% - wt 101, should stabilize once PEG tube in place - no volume overload on exam - follow volume once PEG tube in place, po intake will  be higher, may need Lasix - not on Lasix now, was on 20 mg qd - was on Entresto 49-51, restart at 24-26 once BP will allow - SBP 110s-130s last 24 hr  3. Right MCA stroke: - R-M1 occlusion s/p unsuccessful thrombectomy/TPA - per IM/Neuro - for PEG tube - allow SBP to 150  Signed, Rosaria Ferries, PA-C 11/30/2018 9:19 AM Beeper [phone]   For questions or updates, please contact   Please consult [email or web address] for contact info under Cardiology/STEMI.  Personally seen and examined. Agree with above.   PEG tube today, right MCA stroke, cardiomyopathy EF 35 to 40% with euvolemia.  Getting cleaned up currently by son in bed.   After reviewing neurology's note, and given her underlying carotid artery disease, she may not be able to tolerate Entresto in the future given the goal blood pressure in the 1 40-1 50 range.  Candee Furbish, MD

## 2018-11-30 NOTE — Anesthesia Preprocedure Evaluation (Signed)
Anesthesia Evaluation  Patient identified by MRN, date of birth, ID band Patient awake    Reviewed: Allergy & Precautions, H&P , NPO status , Patient's Chart, lab work & pertinent test results, reviewed documented beta blocker date and time   Airway Mallampati: II  TM Distance: >3 FB Neck ROM: Full    Dental no notable dental hx. (+) Edentulous Upper, Edentulous Lower, Dental Advisory Given   Pulmonary former smoker,    Pulmonary exam normal breath sounds clear to auscultation       Cardiovascular hypertension, Pt. on medications and Pt. on home beta blockers + CAD and + Past MI  + dysrhythmias  Rhythm:Regular Rate:Normal  Echo 11/21/2018   1. Akinesis of the left ventricular, entire inferior wall and inferolateral wall.  2. The left ventricle has moderately reduced systolic function, with an ejection fraction of 35-40%. The cavity size was normal. There is mild concentric left ventricular hypertrophy. Left ventricular diastolic Doppler parameters are consistent with  pseudonormalization. Elevated mean left atrial pressure.  3. The right ventricle has normal systolic function. The cavity was normal. There is no increase in right ventricular wall thickness. Right ventricular systolic pressure could not be assessed.  4. Left atrial size was mildly dilated.  5. There is mild mitral annular calcification present.  6. The aorta is normal unless otherwise noted.   Neuro/Psych Recent embolic MCA infarct 2/2 new Afib s/p tPA and VIR CVA, Residual Symptoms negative psych ROS   GI/Hepatic negative GI ROS, Neg liver ROS, Dysphagia s/p stroke 11/20/2018   Endo/Other  diabetes, Type 2, Oral Hypoglycemic Agents  Renal/GU ESRFRenal disease  negative genitourinary   Musculoskeletal  (+) Arthritis , Osteoarthritis,    Abdominal   Peds  Hematology negative hematology ROS (+) anemia ,   Anesthesia Other Findings   Reproductive/Obstetrics negative OB ROS                             Anesthesia Physical  Anesthesia Plan  ASA: III  Anesthesia Plan: MAC   Post-op Pain Management:    Induction:   PONV Risk Score and Plan: 3 and 2 and Ondansetron, Treatment may vary due to age or medical condition and Propofol infusion  Airway Management Planned: Natural Airway and Nasal Cannula  Additional Equipment: None  Intra-op Plan:   Post-operative Plan:   Informed Consent: I have reviewed the patients History and Physical, chart, labs and discussed the procedure including the risks, benefits and alternatives for the proposed anesthesia with the patient or authorized representative who has indicated his/her understanding and acceptance.     Dental advisory given  Plan Discussed with: CRNA  Anesthesia Plan Comments:         Anesthesia Quick Evaluation

## 2018-11-30 NOTE — Progress Notes (Signed)
ANTICOAGULATION CONSULT NOTE  Pharmacy Consult:  Heparin Indication: atrial fibrillation and stroke  No Known Allergies  Patient Measurements: Height: 5\' 1"  (154.9 cm) Weight: 101 lb 6.6 oz (46 kg) IBW/kg (Calculated) : 47.8 Heparin Dosing Weight: 46kg  Vital Signs: Temp: 98.1 F (36.7 C) (09/10 0340) Temp Source: Axillary (09/10 0340) BP: 126/63 (09/10 0340) Pulse Rate: 77 (09/10 0340)  Labs: Recent Labs    11/28/18 0320 11/29/18 0610 11/29/18 0742 11/29/18 1832 11/30/18 0630  HGB 10.2*  --  10.4*  --  10.4*  HCT 31.7*  --  32.6*  --  32.3*  PLT 271  --  301  --  326  APTT  --  52*  --  110* 72*  HEPARINUNFRC  --  >2.20*  --   --  1.18*  CREATININE 0.87  --  0.94  --  0.93    Estimated Creatinine Clearance: 37.4 mL/min (by C-G formula based on SCr of 0.93 mg/dL).   Assessment: 52 YOF with new Afib and CVA s/p tPA on 11/20/18. The patient was started on heparin initially, then transitioned to apixaban.  Now plan for PEG tube so will transition back to heparin with low end of goal and avoid bolusing given recent stroke.Given recent apixaban dosing, will monitor aPTT and heparin level until correlate.    APTT is therapeutic; no bleeding reported.  Goal of Therapy:  APTT 66-85 sec Heparin level 0.3-0.5 units/ml Monitor platelets by anticoagulation protocol: Yes   Plan:  Continue heparin gtt at 850 units/hr Daily heparin level, aPTT and CBC  Jeter Tomey D. Mina Marble, PharmD, BCPS, [place] 11/30/2018, 8:01 AM

## 2018-12-01 ENCOUNTER — Encounter (HOSPITAL_COMMUNITY): Payer: Self-pay | Admitting: Certified Registered"

## 2018-12-01 ENCOUNTER — Inpatient Hospital Stay (HOSPITAL_COMMUNITY): Payer: Medicare Other | Admitting: Certified Registered"

## 2018-12-01 ENCOUNTER — Encounter (HOSPITAL_COMMUNITY)
Admission: EM | Disposition: A | Discharge status: Home Health | Payer: Self-pay | Source: Home / Self Care | Attending: Neurology

## 2018-12-01 DIAGNOSIS — Z931 Gastrostomy status: Secondary | ICD-10-CM

## 2018-12-01 HISTORY — PX: ESOPHAGOGASTRODUODENOSCOPY: SHX5428

## 2018-12-01 HISTORY — PX: PEG PLACEMENT: SHX5437

## 2018-12-01 LAB — CBC
HCT: 30.5 % — ABNORMAL LOW (ref 36.0–46.0)
Hemoglobin: 10 g/dL — ABNORMAL LOW (ref 12.0–15.0)
MCH: 28 pg (ref 26.0–34.0)
MCHC: 32.8 g/dL (ref 30.0–36.0)
MCV: 85.4 fL (ref 80.0–100.0)
Platelets: 364 10*3/uL (ref 150–400)
RBC: 3.57 MIL/uL — ABNORMAL LOW (ref 3.87–5.11)
RDW: 16.3 % — ABNORMAL HIGH (ref 11.5–15.5)
WBC: 10 10*3/uL (ref 4.0–10.5)
nRBC: 0 % (ref 0.0–0.2)

## 2018-12-01 LAB — BASIC METABOLIC PANEL WITH GFR
Anion gap: 13 (ref 5–15)
BUN: 20 mg/dL (ref 8–23)
CO2: 24 mmol/L (ref 22–32)
Calcium: 8.8 mg/dL — ABNORMAL LOW (ref 8.9–10.3)
Chloride: 102 mmol/L (ref 98–111)
Creatinine, Ser: 0.91 mg/dL (ref 0.44–1.00)
GFR calc Af Amer: >60 mL/min (ref >60)
GFR calc non Af Amer: >60 mL/min (ref >60)
Glucose, Bld: 124 mg/dL — ABNORMAL HIGH (ref 70–99)
Potassium: 4 mmol/L (ref 3.5–5.1)
Sodium: 139 mmol/L (ref 135–145)

## 2018-12-01 LAB — GLUCOSE, CAPILLARY
Glucose-Capillary: 118 mg/dL — ABNORMAL HIGH (ref 70–99)
Glucose-Capillary: 120 mg/dL — ABNORMAL HIGH (ref 70–99)
Glucose-Capillary: 140 mg/dL — ABNORMAL HIGH (ref 70–99)
Glucose-Capillary: 144 mg/dL — ABNORMAL HIGH (ref 70–99)
Glucose-Capillary: 151 mg/dL — ABNORMAL HIGH (ref 70–99)
Glucose-Capillary: 159 mg/dL — ABNORMAL HIGH (ref 70–99)
Glucose-Capillary: 185 mg/dL — ABNORMAL HIGH (ref 70–99)

## 2018-12-01 SURGERY — EGD (ESOPHAGOGASTRODUODENOSCOPY)
Anesthesia: Monitor Anesthesia Care

## 2018-12-01 MED ORDER — OSMOLITE PO LIQD: ORAL | 0 refills | Status: DC

## 2018-12-01 MED ORDER — PANTOPRAZOLE SODIUM 40 MG PO PACK: 40.0000 mg | PACK | Freq: Every day | ORAL | 2 refills | Status: DC

## 2018-12-01 MED ORDER — FREE WATER: 150.0000 mL | Status: DC

## 2018-12-01 MED ORDER — PRO-STAT SUGAR FREE PO LIQD: 30.0000 mL | Freq: Every day | ORAL | 0 refills | Status: DC

## 2018-12-01 MED ORDER — FREE WATER
150.0000 mL | Status: DC
  Administered 2018-12-01 – 2018-12-02 (×6): 150 mL

## 2018-12-01 MED ORDER — PROPOFOL 10 MG/ML IV BOLUS
INTRAVENOUS | Status: DC | PRN
  Administered 2018-12-01 (×3): 20 mg via INTRAVENOUS

## 2018-12-01 MED ORDER — ASPIRIN 81 MG PO CHEW: 81.0000 mg | CHEWABLE_TABLET | Freq: Every day | ORAL | Status: DC

## 2018-12-01 MED ORDER — HEPARIN (PORCINE) 25000 UT/250ML-% IV SOLN
850.0000 [IU]/h | INTRAVENOUS | Status: AC
  Administered 2018-12-01: 850 [IU]/h via INTRAVENOUS

## 2018-12-01 MED ORDER — OSMOLITE 1.2 CAL PO LIQD
1000.0000 mL | ORAL | Status: DC
  Administered 2018-12-01: 1000 mL
  Filled 2018-12-01 (×2): qty 1000

## 2018-12-01 MED ORDER — METFORMIN HCL 850 MG PO TABS: 850.0000 mg | ORAL_TABLET | Freq: Two times a day (BID) | ORAL | 2 refills | Status: DC

## 2018-12-01 MED ORDER — OSMOLITE 1.2 CAL PO LIQD
237.0000 mL | Freq: Every day | ORAL | Status: DC
  Administered 2018-12-01: 237 mL
  Filled 2018-12-01 (×3): qty 237

## 2018-12-01 MED ORDER — PRO-STAT SUGAR FREE PO LIQD
30.0000 mL | Freq: Every day | ORAL | Status: DC
  Administered 2018-12-01 – 2018-12-02 (×2): 30 mL
  Filled 2018-12-01 (×2): qty 30

## 2018-12-01 MED ORDER — APIXABAN 5 MG PO TABS
5.0000 mg | ORAL_TABLET | Freq: Two times a day (BID) | ORAL | Status: DC
  Filled 2018-12-01: qty 1

## 2018-12-01 MED ORDER — PROPOFOL 500 MG/50ML IV EMUL
INTRAVENOUS | Status: DC | PRN
  Administered 2018-12-01: 100 ug/kg/min via INTRAVENOUS

## 2018-12-01 MED ORDER — FENTANYL CITRATE (PF) 100 MCG/2ML IJ SOLN
12.5000 ug | Freq: Once | INTRAMUSCULAR | Status: AC
  Administered 2018-12-01: 12.5 ug via INTRAVENOUS
  Filled 2018-12-01: qty 2

## 2018-12-01 MED ORDER — IRBESARTAN 300 MG PO TABS: 300.0000 mg | ORAL_TABLET | Freq: Every day | ORAL | 2 refills | Status: DC

## 2018-12-01 MED ORDER — METOPROLOL TARTRATE 50 MG PO TABS: 50.0000 mg | ORAL_TABLET | Freq: Two times a day (BID) | ORAL | 2 refills | Status: DC

## 2018-12-01 MED ORDER — APIXABAN 5 MG PO TABS: 5.0000 mg | ORAL_TABLET | Freq: Two times a day (BID) | ORAL | 2 refills | Status: AC

## 2018-12-01 MED ORDER — HYDROCHLOROTHIAZIDE 12.5 MG PO CAPS: 12.5000 mg | ORAL_CAPSULE | Freq: Every day | ORAL | 2 refills | Status: DC

## 2018-12-01 MED ORDER — DICYCLOMINE HCL 10 MG PO CAPS: 10.0000 mg | ORAL_CAPSULE | Freq: Four times a day (QID) | ORAL | Status: DC | PRN

## 2018-12-01 MED ORDER — SENNOSIDES 8.8 MG/5ML PO SYRP: 5.0000 mL | ORAL_SOLUTION | Freq: Every day | ORAL | 0 refills | Status: DC

## 2018-12-01 MED ORDER — APIXABAN 5 MG PO TABS
5.0000 mg | ORAL_TABLET | Freq: Two times a day (BID) | ORAL | Status: DC
  Administered 2018-12-01 – 2018-12-02 (×2): 5 mg
  Filled 2018-12-01: qty 1

## 2018-12-01 MED ORDER — SIMVASTATIN 20 MG PO TABS: 20.0000 mg | ORAL_TABLET | Freq: Every day | ORAL | 2 refills | Status: DC

## 2018-12-01 MED FILL — OSMOLITE 1.5 CAL ARC INS 24: 5 days supply | Qty: 5688 | Fill #0

## 2018-12-01 NOTE — Progress Notes (Signed)
Occupational Therapy Treatment Patient Details Name: [name] MRN: [id number] DOB: 10-26-1940 Today's Date: 12/01/2018    History of present illness 78 yo female admitted with R cerebral infarct frontoparietal infarect with failed attempt to revascularization and extubated 9/2 PMH:hx of CAD, MI 42 years ago [facility]?), cardiomyopathy with EF of 30-35% by echo, bilateral carotid artery stenosis with left ICA occlsuion and right carotid endarterectomy (2008), CVA (?2015), HTN, HLD, DM, and mitral valve insufficiency     OT comments  Pt seen with PT this date for functional mobility progression. Overall, pt total A +2 for bed mobility; functional mobility and ADLs. Utilized mirror to facilitate tracking to midline. Pt unable to achieve midline without total assist to facilitate movement of head towards midline. Utilized self- ROM technique to facilitate AROM in LUE. Pt requires MOD A to hold on to LUE during shoulder flexion.DC plan remains appropriate. Will continue to follow for acute OT needs.   Follow Up Recommendations  SNF;Supervision/Assistance - 24 hour    Equipment Recommendations  3 in 1 bedside commode;Wheelchair (measurements OT);Wheelchair cushion (measurements OT);Hospital bed    Recommendations for Other Services      Precautions / Restrictions Precautions Precautions: Fall Precaution Comments: L hemi, R gaze preference Restrictions Weight Bearing Restrictions: No       Mobility Bed Mobility Overal bed mobility: Needs Assistance Bed Mobility: Rolling;Sidelying to Sit Rolling: Total assist;+2 for physical assistance Sidelying to sit: Total assist;+2 for physical assistance;+2 for safety/equipment       General bed mobility comments: total assist to advance BUE to EOB and elevate trunk  Transfers Overall transfer level: Needs assistance Equipment used: 2 person hand held assist Transfers: Sit to/from [employer] Transfers Sit to Stand: Total assist;+2  physical assistance;+2 safety/equipment Stand pivot transfers: Total assist;+2 physical assistance;+2 safety/equipment       General transfer comment: +2 face to face method utilized. requires physical assist to step with RLE and pivot hips towards recliner.    Balance Overall balance assessment: Needs assistance Sitting-balance support: Feet supported;Single extremity supported Sitting balance-Leahy Scale: Poor Sitting balance - Comments: MAX A to maintain EOB sitting; pt with heavy L lean; manual facilitation to L side to assist with sitting at midline Postural control: Left lateral lean Standing balance support: Bilateral upper extremity supported;Single extremity supported Standing balance-Leahy Scale: Zero Standing balance comment: max-totalA+2 for standing balance                           ADL either performed or assessed with clinical judgement   ADL Overall ADL's : Needs assistance/impaired                         Toilet Transfer: Total assistance;+2 for physical assistance Toilet Transfer Details (indicate cue type and reason): simualted from EOB>recliner. total A +2; pt unable to advance LLE         Functional mobility during ADLs: Total assistance;+2 for physical assistance;+2 for safety/equipment General ADL Comments: pt tolerating sitting EOB and transfer to recliner during session. positioned recliner in front of mirror to attempt to trask towards midline; unable to achieve midline gaze; suspect total a for further ADLs     Vision Baseline Vision/History: Wears glasses(son dropped off glasses during session) Wears Glasses: At all times Patient Visual Report: Other (comment)(R gaze) Additional Comments: sitt affected by R gaze perference; pt closes eyes often during session; difficult to further assess   Perception  Praxis      Cognition Arousal/Alertness: Awake/alert Behavior During Therapy: Flat affect Overall Cognitive Status:  Impaired/Different from baseline Area of Impairment: Attention;Memory;Awareness;Following commands;Problem solving                   Current Attention Level: Focused Memory: Decreased recall of precautions;Decreased short-term memory Following Commands: Follows one step commands with increased time;Follows one step commands inconsistently Safety/Judgement: Decreased awareness of safety;Decreased awareness of deficits Awareness: Intellectual Problem Solving: Slow processing;Decreased initiation;Difficulty sequencing;Requires verbal cues;Requires tactile cues          Exercises     Shoulder Instructions       General Comments      Pertinent Vitals/ Pain       Pain Assessment: Faces Faces Pain Scale: No hurt  Home Living                                          Prior Functioning/Environment              Frequency  Min 2X/week        Progress Toward Goals  OT Goals(current goals can now be found in the care plan section)  Progress towards OT goals: Progressing toward goals  Acute Rehab OT Goals Time For Goal Achievement: 12/08/18 Potential to Achieve Goals: Good  Plan Discharge plan remains appropriate;Frequency remains appropriate    Co-evaluation    PT/OT/SLP Co-Evaluation/Treatment: Yes Reason for Co-Treatment: Complexity of the patient's impairments (multi-system involvement);For patient/therapist safety;To address functional/ADL transfers   OT goals addressed during session: ADL's and self-care;Strengthening/ROM      AM-PAC OT "6 Clicks" Daily Activity     Outcome Measure   Help from another person eating meals?: Total Help from another person taking care of personal grooming?: A Lot Help from another person toileting, which includes using toliet, bedpan, or urinal?: Total Help from another person bathing (including washing, rinsing, drying)?: Total Help from another person to put on and taking off regular upper body  clothing?: Total Help from another person to put on and taking off regular lower body clothing?: Total 6 Click Score: 7    End of Session Equipment Utilized During Treatment: Gait belt  OT Visit Diagnosis: Unsteadiness on feet (R26.81);Muscle weakness (generalized) (M62.81)   Activity Tolerance Patient tolerated treatment well   Patient Left with call bell/phone within reach;with family/visitor present;in chair;with chair alarm set   Nurse Communication          Time: [phone] OT Time Calculation (min): 29 min  Charges: OT General Charges $OT Visit: 1 Visit OT Treatments $Therapeutic Activity: 23-37 mins  [place], [place] [phone] [place] 12/01/2018, 1:12 PM

## 2018-12-01 NOTE — Interval H&P Note (Signed)
History and Physical Interval Note:  12/01/2018 7:27 AM  [name]  has presented today for surgery, with the diagnosis of Dysphagia, CVA.  The various methods of treatment have been discussed with the patient and family. After consideration of risks, benefits and other options for treatment, the patient has consented to  Procedure(s): ESOPHAGOGASTRODUODENOSCOPY (EGD) (N/A) PERCUTANEOUS ENDOSCOPIC GASTROSTOMY (PEG) PLACEMENT (N/A) as a surgical intervention.  The patient's history has been reviewed, patient examined, no change in status, stable for surgery.  I have reviewed the patient's chart and labs.  Questions were answered to the patient's satisfaction.     Zenovia Jarred

## 2018-12-01 NOTE — Progress Notes (Signed)
   No new recommendations at this time.  Since she is currently on angiotensin receptor blocker 300 mg and metoprolol tartrate 50 mg twice a day, I think this is adequate and I would not utilize Entresto at this time for fear of further hypotension which could compromise her in the setting of her carotid artery disease.  Consider consolidating her metoprolol tartrate over to metoprolol succinate or Toprol-XL once a day.  We will sign off.  Candee Furbish, MD

## 2018-12-01 NOTE — Progress Notes (Addendum)
ANTICOAGULATION CONSULT NOTE  Pharmacy Consult:  Heparin Indication: atrial fibrillation and stroke  No Known Allergies  Patient Measurements: Height: 5\' 1"  (154.9 cm) Weight: 101 lb 13.6 oz (46.2 kg) IBW/kg (Calculated) : 47.8 Heparin Dosing Weight: 46kg  Vital Signs: Temp: 98.2 F (36.8 C) (09/11 0801) Temp Source: Oral (09/11 0801) BP: 138/41 (09/11 0810) Pulse Rate: 64 (09/11 0810)  Labs: Recent Labs    11/29/18 0610  11/29/18 0742 11/29/18 1832 11/30/18 0630 12/01/18 0325  HGB  --    < > 10.4*  --  10.4* 10.0*  HCT  --   --  32.6*  --  32.3* 30.5*  PLT  --   --  301  --  326 364  APTT 52*  --   --  110* 72*  --   HEPARINUNFRC >2.20*  --   --   --  1.18*  --   CREATININE  --   --  0.94  --  0.93 0.91   < > = values in this interval not displayed.    Estimated Creatinine Clearance: 38.4 mL/min (by C-G formula based on SCr of 0.91 mg/dL).   Assessment: 16 YOF with new Afib and CVA s/p tPA on 11/20/18. The patient was started on heparin initially, then transitioned to apixaban.  Now plan for PEG tube so will transition back to heparin with low end of goal and avoid bolusing given recent stroke.Given recent apixaban dosing, will monitor aPTT and heparin level until correlate.    Heparin was off since 0330 for PEG placement.  Per IR to resume at 1400 today.  No bleeding reported.  Goal of Therapy:  APTT 66-85 sec Heparin level 0.3-0.5 units/ml Monitor platelets by anticoagulation protocol: Yes   Plan:  Resume heparin gtt at 850 units/hr at 1400 today per IR Check 10 hr aPTT since patient likely need time to accumulate Daily heparin level, aPTT and CBC  Thuy D. Mina Marble, PharmD, BCPS, [place] 12/01/2018, 8:35 AM   Addendum: Start Eliquis 5 mg po bid D/c heparin when first dose of eliquis administered  Alanda Slim, PharmD, [facility] Clinical Pharmacist Please see AMION for all Pharmacists' Contact Phone Numbers 12/01/2018, 3:09 PM

## 2018-12-01 NOTE — Progress Notes (Signed)
STROKE TEAM PROGRESS NOTE   INTERVAL HISTORY Pt sitting in chair, son at bedside. Pt had PEG this morning. Will start tube feeding at noon. Will restart heparin IV at 2pm. Will transition from heparin IV to eliquis in preparation for discharge, hopefully tomorrow.   Vitals:   12/01/18 0810 12/01/18 0838 12/01/18 1116 12/01/18 1126  BP: (!) 138/41 (!) 137/51 117/87 117/87  Pulse: 64 (!) 58 84 75  Resp: 14 18  (!) 22  Temp:  99 F (37.2 C)  98.5 F (36.9 C)  TempSrc:  Oral  Axillary  SpO2: 98% 98%  100%  Weight:      Height:        CBC:  Recent Labs  Lab 11/30/18 0630 12/01/18 0325  WBC 10.4 10.0  HGB 10.4* 10.0*  HCT 32.3* 30.5*  MCV 86.4 85.4  PLT 326 [id number]    Basic Metabolic Panel:  Recent Labs  Lab 11/25/18 0446 11/26/18 0503 11/27/18 0704  11/30/18 0630 12/01/18 0325  NA 139 139 138   < > 137 139  K 3.5 3.1* 4.3   < > 4.0 4.0  CL 107 103 103   < > 99 102  CO2 19* 25 25   < > 26 24  GLUCOSE 211* 188* 180*   < > 118* 124*  BUN 19 16 17    < > 21 20  CREATININE 0.97 0.91 0.94   < > 0.93 0.91  CALCIUM 8.3* 8.5* 8.5*   < > 8.9 8.8*  MG 1.9  --  1.8  --   --   --   PHOS 2.1* 3.0  --   --   --   --    < > = values in this interval not displayed.   Lipid Panel:     Component Value Date/Time   CHOL 124 11/21/2018 0538   TRIG 106 11/23/2018 0221   HDL 39 (L) 11/21/2018 0538   CHOLHDL 3.2 11/21/2018 0538   VLDL 13 11/21/2018 0538   LDLCALC 72 11/21/2018 0538   HgbA1c:  Lab Results  Component Value Date   HGBA1C 6.6 (H) 11/21/2018    IMAGING  Ct Head Code Stroke Wo Contrast 11/20/2018 1. Subtle hyperdense right MCA. There is occlusion of the right distal M1 on CTA today. 2. ASPECTS is 10 3. No acute infarct or hemorrhage   Ct Angio Head W Or Wo Contrast Ct Angio Neck W Or Wo Contrast 11/20/2018 1. Occlusion right M1 segment distally with poor flow in the right M2 and M3 branches. 2. Postop right carotid endarterectomy 2008. Right carotid bifurcation  widely patent 3. Severe atherosclerotic disease left carotid bifurcation with occlusion of the left internal carotid artery. 4. Left anterior and left middle cerebral arteries appear patent without significant stenosis through collateral flow through the anterior communicating artery. 5. Posterior circulation intact    Cerebral Angiogram 11/20/2018 1.occluded Rt MCA distal M1 seg. Unsuccessful revascularization Despite x 2 passes with 74mm xc 71mm embotrap retriever device and penumbra aspiration  due to underlying hard ICAD. Unsuccessful intracranial rescue stenting dut vascular tortuosity.  Mr Brain Wo Contrast 11/21/2018 8 cm confluent region of infarction in the right middle cerebral artery territory affecting the insula and frontoparietal brain. Mild swelling but no evidence of hemorrhage at this time or of significant mass effect or any shift. Old infarctions within the right cerebellum, left occipital lobe and at the left frontoparietal vertex.   Ct Head  Wo Contrast 11/25/2018 Subacute large RIGH T  MCA territory infarct with 3 mm of RIGHT to LEFT midline shift, new since 11/20/2018 but consistent with large subacute infarct. Underlying atrophy with small vessel chronic ischemic changes of deep cerebral white matter. No additional new intracranial abnormalities.  Dg Chest Port 1 View 11/20/2018 1. The endotracheal tube is borderline low. Repositioning should be considered. 2. Cardiomegaly with mild vascular congestion.  11/21/2018 Endotracheal tube in satisfactory position. No acute abnormality noted.  11/25/2018 1. Interval extubation and placement of an enteric tube coursing below the diaphragm with the distal tip out of field of view. 2. Chronic bilateral increased bronchial markings likely chronic bronchitis. No definite new focal infiltrate. 3. Small area of lucency at the right lung base is likely artifactual. Consider repeat radiograph, preferably PA and lateral radiographs, to  exclude small pneumothorax.  2D Echocardiogram  1. Akinesis of the left ventricular, entire inferior wall and inferolateral wall.  2. The left ventricle has moderately reduced systolic function, with an ejection fraction of 35-40%. The cavity size was normal. There is mild concentric left ventricular hypertrophy. Left ventricular diastolic Doppler parameters are consistent with pseudonormalization. Elevated mean left atrial pressure.  3. The right ventricle has normal systolic function. The cavity was normal. There is no increase in right ventricular wall thickness. Right ventricular systolic pressure could not be assessed.  4. Left atrial size was mildly dilated.  5. There is mild mitral annular calcification present.  6. The aorta is normal unless otherwise noted.  EEG - evidence of a right hemispheric dysfunction which is consistent with the patient's known right hemispheric infarct.   PHYSICAL EXAM Elderly African-American lady awake alert in chair. Afebrile. Head is nontraumatic. Neck is supple without bruit.    Cardiac exam no murmur or gallop. Lungs with coarse rhonchi. Distal pulses are well felt.  Neurological Exam : Patient is awake alert sitting in chair, severe dysarthria but orientated to place, age, self and year, but not to month, followed most simple commands.  She has right gaze deviation.  She is not able to follow gaze to the left.  She blinks to threat inconsistently bilaterally.  Pupils equal reactive.  Fundi not visualized. left facial droop. cortrak tube in place. She does move right upper and lower extremity against gravity.  She withdraws left lower extremity mildly to pain.  Does not withdraw left upper extremity. Tone is diminished on the left and normal on the right.  Right plantars downgoing left is equivocal.  ASSESSMENT/PLAN Ms. [name] is a 78 y.o. female with history of myocardial infarction, hypertension, hyperlipidemia, diabetes, carotid artery  occlusion on the left, CAD and arthritis presenting with L sided neglect, R eye deviation and L side flaccid. Received tPA 11/20/2018 at 1545. CTA showed R M1 occlusion. Taken to IR.  SBP > 220.   Stroke: R MCA insular/frontoparietal infarct due to right ICA and MCA occlusion s/p tPA and unsuccessful IR - embolic likely from new diagnosed AF  Code Stroke CT head subtle hyperdense R MCA.     CTA head & neck R M1 Occlusion w/ poor flor R M2 and R M3. R CEA 2008 w/ patent bifurcation. Severe L ICA bifurcation atherosclerosis w/ occlusion L ICA.   Cerebral angio occluded R M1. Unsuccessful revascularization after 2 passes w/ embotrap and penumbra aspiration d/t underlying hard atherosclerosis and vascular tortuosity.  Post IR CT no ICH or mass effect  MRI R MCA infarct insular and frontoparietal. Old R cerebellar, L occipital, L frontoparietal vertex infarcts.  CT  repeat 9/5 right large infarct with 90mm MLS, no hemorrhage  2D Echo EF 35-40%. LA mildly dilated. No source of embolus   LDL 72  HgbA1c 6.6   EEG 11/25/18 - evidence of a right hemispheric dysfunction which is consistent with the patient's known right hemispheric infarct.  Eliquis for VTE prophylaxis  aspirin 81 mg daily and pletal prior to admission, now on aspirin 81mg . Will transition from heparin IV to eliquis today  Therapy recommendations:  SNF - son wants to take home with [facility]. HH ordered. Equipment (bed, hoyer, w/c) ordered. Tube feedings ordered.   Disposition:  pending  (lives alone, son checks on daily)  Anticipate d/c this weekend  Acute Respiratory Failure w/ hypoxia  Intubated for IR, not extubated at the end of the procedure d/t tPA administration and sheath left in  Sheath now out  Extubated 9/2   Cheyne stokes breathing pattern 11/25/18   Much improved and now resolved   Atrial Fibrillation w/ RVR, confirmed, new dx PSVT cardiomyopathy  telemetry monitoring showed AF    Treated with  metoprolol  Cardiology consulted, appreciate help, now signed off  Off cardizem givem cardiomyopathy    Metoprolol 50 mg bid  S/p PEG, will transition from heparin IV to eliquis  EF 35-40%, euvolemic  - on irbesartan and BB. Off lasix. Recommend no enestro.    F/u w/ cardiology as outpt   Dysphagia  Secondary to stroke  Failed MBSS, did poorly with repeat MBSS  S/p PEG today  Restart tube feeding around noon  Transition heparin IV to eliquis   D/c IVF   Continue free water flush 150cc Q4h  Home TFs ordered  Carotid Disease  L ICA occlusion  Hx of right CEA 2008  Hypertensive Urgency  SBP > 220 in ED, as high as 222/128  Home meds:  norvasc 10, clonidine 0.1 bid, labetalol 200 bid, valsartan 320-12.5 daily  Treated w/ cleviprex, Now off.   Now on avapro, metoprolol, cardizem, HCTZ 13 mg  BP stable  Long-term BP goal 130-150 given right ICA occlusion  Hyperlipidemia  Home meds:  zocor 20  LDL 72, goal < 70  Resume zocor 20  Continue statin at discharge  Diabetes type II Controlled  Home meds:  metformin 500  HgbA1c 6.6, goal < 7.0  CBGs  SSI  DB RN on baord  Mild hyperglycemia  On metformin 850mg  bid   Other Stroke Risk Factors  Advanced age  Former Cigarette smoker  Coronary artery disease s/p ? MI 20 yrs ago. No noted cardiac cath    Other Active Problems  R DP and PT not dopplerable prior to or post IR  Acute blood loss anemia 13.9->9.2->10.4->10.1->10.2->10.4->10.4->10.0  Urinary retention s/p multiple I&Os - on foley, UA neg - d/c foley 9/9 and monitoring with bladder scan  Hypokalemia - K 3.1-> supplement ->4.3->4.4->4.1->4.0->4.0  Hospital day # 11   Rosalin Hawking, MD PhD Stroke Neurology 12/01/2018 1:18 PM   To contact Stroke Continuity provider, please refer to [email or web address] After hours, contact General Neurology

## 2018-12-01 NOTE — Care Management (Addendum)
CM [place] covering unit today.  CM confirmed with [facility]; agency confirms all equipment will be delivered to pts son's home as previously arranged at the very latest today - confirmation of delivery will need to be made prior to discharge.  [facility] will also supply tube feeds/equipment - CM requested order for tube feeds/formula via bedside nurse - once orders are written [facility] needs to be notified.  Tube Feeds will be sent via mail to pts son home and agency request that pt is sent home with 4-5 day supply to allow for home delivery - unit made aware by CM KW on 11/30/18 - CM [place] also communicated this with attending and bedside nurse.    Update:  Pt will discharge home on bolus feedings instead of continuous.  [facility] made aware and will pull order from Epic when it becomes available.  [facility] now asking for 5 day supply of tube feeds to go with pt on discharge - Bedside nurse Ailene Ravel will contact pharmacy and arrange.  [facility] made aware that pt will discharge over the weekend. Bedside nurse, [place] and [facility] will provide teaching on equipment and maintenance of tube feeds.  [facility] can not provide tube feeds until Monday at the earliest  Update:  CM informed by [facility] pharmacy that they can not provide 5 day supply of tube feeds at discharge with current formula.  [place] pharmacy communicated barrier with Neurology NP and they worked out a plan for the pt to get a 5 day supply of an acceptable tube feed formula while awaiting shipment from [facility] from [place].  TOC was able to retrieve payment for supply from son before COB.  TOC delivered supply to pts son at bedside today.  [facility] to follow up with [facility] regarding equipment delivery this evening.

## 2018-12-01 NOTE — Discharge Summary (Addendum)
Stroke Discharge Summary  Patient ID: [name]   MRN: [id number]      DOB: January 02, 1941  Date of Admission: 11/20/2018 Date of Discharge: 12/02/2018  Attending Physician:  Rosalin Hawking, MD, Stroke MD Consultant(s):   Olene Craven) Estanislado Pandy, MD (Interventional Neuroradiologist), Renee Pain, MD (pulmonary/intensive care), Ena Dawley (cardiology) and Georganna Skeans, MD (trauma-PEG)  Patient's PCP:  Eloy End, MD  DISCHARGE DIAGNOSIS:  Principal Problem:   Embolic stroke involving right middle cerebral artery (HCC) s/p tPA and IR, d/t new AF Active Problems:   Middle cerebral artery embolism, right   Acute respiratory failure with hypoxia ([place])   Encephalopathy acute   Type 2 diabetes mellitus without complication, without long-term current use of insulin (HCC)   Heart murmur   Bilateral carotid artery stenosis   Protein-calorie malnutrition, severe   Atrial fibrillation with RVR (HCC)   PSVT (paroxysmal supraventricular tachycardia) (HCC)   Dysphagia   Hypertensive urgency   Hyperlipemia   Cardiomyopathy ([place])   CAD (coronary artery disease)   Advanced age   Acute blood loss anemia   Urinary retention   Hypokalemia   Past Medical History:  Diagnosis Date  . Arthritis   . CAD (coronary artery disease)   . Carotid artery occlusion   . Diabetes mellitus   . Hyperlipidemia   . Hypertension   . Myocardial infarction [facility])    Past Surgical History:  Procedure Laterality Date  . CAROTID ENDARTERECTOMY  01/06/2007   right - with Dacron patch angioplasty  . HERNIA REPAIR  2012  . IR CT HEAD LTD  11/20/2018  . IR PERCUTANEOUS ART THROMBECTOMY/INFUSION INTRACRANIAL INC DIAG ANGIO  11/20/2018  . RADIOLOGY WITH ANESTHESIA N/A 11/20/2018   Procedure: CODE STROKE;  Surgeon: Luanne Bras, MD;  Location: [facility];  Service: Radiology;  Laterality: N/A;    Allergies as of 12/02/2018   No Known Allergies     Medication List    STOP taking these  medications   aspirin EC 81 MG tablet Replaced by: aspirin 81 MG chewable tablet   cholecalciferol 1000 units tablet Commonly known as: VITAMIN D   cilostazol 100 MG tablet Commonly known as: PLETAL   cloNIDine 0.1 MG tablet Commonly known as: CATAPRES   Entresto 49-51 MG Generic drug: sacubitril-valsartan   furosemide 20 MG tablet Commonly known as: LASIX   labetalol 200 MG tablet Commonly known as: NORMODYNE   loratadine 10 MG tablet Commonly known as: CLARITIN   omeprazole 20 MG capsule Commonly known as: PRILOSEC   Zenpep 40000-126000 units Cpep Generic drug: Pancrelipase (Lip-Prot-Amyl)     TAKE these medications   apixaban 5 MG Tabs tablet Commonly known as: ELIQUIS Place 1 tablet (5 mg total) into feeding tube every 12 (twelve) hours. Crush med prior to putting in tube and administer promptly   apixaban 5 MG Tabs tablet Commonly known as: ELIQUIS Place 1 tablet (5 mg total) into feeding tube 2 (two) times daily.   aspirin 81 MG chewable tablet Place 1 tablet (81 mg total) into feeding tube daily. Crush med prior to putting in tube Replaces: aspirin EC 81 MG tablet   dicyclomine 10 MG capsule Commonly known as: BENTYL Place 1 capsule (10 mg total) into feeding tube 4 (four) times daily as needed for spasms. Crush med prior to putting in tube What changed:   how to take this  additional instructions   feeding supplement (PRO-STAT SUGAR FREE 64) Liqd Place 30 mLs into feeding  tube daily.   free water Soln Place 150 mLs into feeding tube every 4 (four) hours.   hydrochlorothiazide 12.5 MG capsule Commonly known as: Microzide Place 1 capsule (12.5 mg total) into feeding tube daily. Crush med prior to putting in tube   irbesartan 300 MG tablet Commonly known as: AVAPRO Place 1 tablet (300 mg total) into feeding tube daily. Crush med prior to putting in tube   metFORMIN 850 MG tablet Commonly known as: GLUCOPHAGE Place 1 tablet (850 mg total) into  feeding tube 2 (two) times daily with a meal. Crush med prior to putting in tube What changed:   medication strength  how much to take  how to take this  additional instructions   metoprolol tartrate 50 MG tablet Commonly known as: LOPRESSOR Place 1 tablet (50 mg total) into feeding tube 2 (two) times daily. Crush med prior to putting in tube   Osmolite Liqd Give 1 can 5 times daily   pantoprazole sodium 40 mg/20 mL Pack Commonly known as: PROTONIX Place 20 mLs (40 mg total) into feeding tube at bedtime.   sennosides 8.8 MG/5ML syrup Commonly known as: SENOKOT Place 5 mLs into feeding tube at bedtime.   simvastatin 20 MG tablet Commonly known as: ZOCOR Place 1 tablet (20 mg total) into feeding tube at bedtime. Crush med prior to putting in tube What changed:   how to take this  additional instructions            Durable Medical Equipment  (From admission, onward)         Start     Ordered   12/01/18 1532  For home use only DME Tube feeding  Once    Comments: osmolite 1.2 252mL 5 times a day   12/01/18 1532   11/30/18 1158  For home use only DME Suction  Once    Comments: 6 months  Question:  Suction  Answer:  Oral   11/30/18 1158   11/29/18 1148  For home use only DME lightweight manual wheelchair with seat cushion  Once    Comments: Patient suffers from stroke which impairs their ability to perform daily activities like bathing, dressing, grooming and toileting in the home.  A walker will not resolve  issue with performing activities of daily living. A wheelchair will allow patient to safely perform daily activities. Patient is not able to propel themselves in the home using a standard weight wheelchair due to arm weakness, endurance and general weakness. Patient can self propel in the lightweight wheelchair. Length of need Lifetime. Accessories: elevating leg rests (ELRs), wheel locks, extensions and anti-tippers.   11/29/18 1148   11/29/18 1142  For home  use only DME Hospital bed  Once    Question Answer Comment  Length of Need Lifetime   Patient has (list medical condition): stroke   Bed type Semi-electric   Hoyer Lift Yes      11/29/18 1148          LABORATORY STUDIES CBC    Component Value Date/Time   WBC 12.3 (H) 12/02/2018 0011   RBC 3.91 12/02/2018 0011   HGB 10.8 (L) 12/02/2018 0011   HCT 33.7 (L) 12/02/2018 0011   PLT 397 12/02/2018 0011   MCV 86.2 12/02/2018 0011   MCH 27.6 12/02/2018 0011   MCHC 32.0 12/02/2018 0011   RDW 16.4 (H) 12/02/2018 0011   LYMPHSABS 0.8 11/21/2018 0538   MONOABS 0.7 11/21/2018 0538   EOSABS 0.0 11/21/2018 [id number]  BASOSABS 0.0 11/21/2018 0538   CMP    Component Value Date/Time   NA 138 12/02/2018 0011   K 4.0 12/02/2018 0011   CL 99 12/02/2018 0011   CO2 25 12/02/2018 0011   GLUCOSE 186 (H) 12/02/2018 0011   BUN 22 12/02/2018 0011   CREATININE 1.03 (H) 12/02/2018 0011   CALCIUM 9.5 12/02/2018 0011   PROT 6.7 11/20/2018 1518   ALBUMIN 3.8 11/20/2018 1518   AST 19 11/20/2018 1518   ALT 32 11/20/2018 1518   ALKPHOS 66 11/20/2018 1518   BILITOT 0.9 11/20/2018 1518   GFRNONAA 53 (L) 12/02/2018 0011   GFRAA >60 12/02/2018 0011   COAGS Lab Results  Component Value Date   INR 1.0 11/20/2018   INR 1.0 01/05/2007   Lipid Panel    Component Value Date/Time   CHOL 124 11/21/2018 0538   TRIG 106 11/23/2018 0221   HDL 39 (L) 11/21/2018 0538   CHOLHDL 3.2 11/21/2018 0538   VLDL 13 11/21/2018 0538   LDLCALC 72 11/21/2018 0538   HgbA1C  Lab Results  Component Value Date   HGBA1C 6.6 (H) 11/21/2018   Urinalysis    Component Value Date/Time   COLORURINE YELLOW 11/26/2018 1238   APPEARANCEUR HAZY (A) 11/26/2018 1238   LABSPEC 1.013 11/26/2018 1238   PHURINE 6.0 11/26/2018 1238   GLUCOSEU NEGATIVE 11/26/2018 1238   HGBUR NEGATIVE 11/26/2018 1238   [place] 11/26/2018 1238   KETONESUR NEGATIVE 11/26/2018 1238   PROTEINUR NEGATIVE 11/26/2018 1238   UROBILINOGEN  1.0 01/05/2007 1127   NITRITE NEGATIVE 11/26/2018 1238   LEUKOCYTESUR NEGATIVE 11/26/2018 1238   Urine Drug Screen No results found for: LABOPIA, COCAINSCRNUR, LABBENZ, AMPHETMU, THCU, LABBARB  Alcohol Level No results found for: [facility]   SIGNIFICANT DIAGNOSTIC STUDIES Ct Head Code Stroke Wo Contrast 11/20/2018 1. Subtle hyperdense right MCA. There is occlusion of the right distal M1 on CTA today. 2. ASPECTS is 10 3. No acute infarct or hemorrhage   Ct Angio Head W Or Wo Contrast Ct Angio Neck W Or Wo Contrast 11/20/2018 1. Occlusion right M1 segment distally with poor flow in the right M2 and M3 branches. 2. Postop right carotid endarterectomy 2008. Right carotid bifurcation widely patent 3. Severe atherosclerotic disease left carotid bifurcation with occlusion of the left internal carotid artery. 4. Left anterior and left middle cerebral arteries appear patent without significant stenosis through collateral flow through the anterior communicating artery. 5. Posterior circulation intact    Cerebral Angiogram 11/20/2018 1.occluded Rt MCA distal M1 seg. Unsuccessful revascularization Despite x 2 passes with 58mm xc 82mm embotrap retriever device and penumbra aspiration  due to underlying hard ICAD. Unsuccessful intracranial rescue stenting dut vascular tortuosity.  Mr Brain Wo Contrast 11/21/2018 8 cm confluent region of infarction in the right middle cerebral artery territory affecting the insula and frontoparietal brain. Mild swelling but no evidence of hemorrhage at this time or of significant mass effect or any shift. Old infarctions within the right cerebellum, left occipital lobe and at the left frontoparietal vertex.   Ct Head  Wo Contrast 11/25/2018 Subacute large RIGH T MCA territory infarct with 3 mm of RIGHT to LEFT midline shift, new since 11/20/2018 but consistent with large subacute infarct. Underlying atrophy with small vessel chronic ischemic changes of deep cerebral white  matter. No additional new intracranial abnormalities.  Dg Chest Port 1 View 11/20/2018 1. The endotracheal tube is borderline low. Repositioning should be considered. 2. Cardiomegaly with mild vascular congestion.  11/21/2018 Endotracheal  tube in satisfactory position. No acute abnormality noted.  11/25/2018 1. Interval extubation and placement of an enteric tube coursing below the diaphragm with the distal tip out of field of view. 2. Chronic bilateral increased bronchial markings likely chronic bronchitis. No definite new focal infiltrate. 3. Small area of lucency at the right lung base is likely artifactual. Consider repeat radiograph, preferably PA and lateral radiographs, to exclude small pneumothorax.  2D Echocardiogram 11/21/2018 1. Akinesis of the left ventricular, entire inferior wall and inferolateral wall. 2. The left ventricle has moderately reduced systolic function, with an ejection fraction of 35-40%. The cavity size was normal. There is mild concentric left ventricular hypertrophy. Left ventricular diastolic Doppler parameters are consistent with pseudonormalization. Elevated mean left atrial pressure. 3. The right ventricle has normal systolic function. The cavity was normal. There is no increase in right ventricular wall thickness. Right ventricular systolic pressure could not be assessed. 4. Left atrial size was mildly dilated. 5. There is mild mitral annular calcification present. 6. The aorta is normal unless otherwise noted.  EEG - evidence of a right hemispheric dysfunction which is consistent with the patient's known right hemispheric infarct.     HISTORY OF PRESENT ILLNESS [place] B Hullum is a 78 y.o. female with history of myocardial infarction, hypertension, hyperlipidemia, diabetes, carotid artery occlusion on the left, CAD and arthritis.  Patient lives by herself, and cares for self however her son looks in on her every day.  Per son patient was last  talked to at approximately 12:00 today 11/20/2018 (LKW).  Her family member visited her right about noon, and her son talked to her on the phone and she was normal at that time. She was then found with a left-sided neglect, eye deviation to the right and flaccid left side.  Patient was recently at the hospital for what sounds like by description a pericardial effusion which was treated with diuretics.  Patient currently is on aspirin.  Patient was brought immediately to the hospital as a code stroke.  On arrival she was brought to CT scanner to obtain a CT head/CTA head and neck. Premorbid modified Rankin scale (mRS): 1. NIHSS: 22  In the emergency department, she was treated with IV TPA after discussion of the risks and benefits with son.  She had a CTA demonstrating right M1 occlusion and thrombectomy was discussed with her son.  He felt like she would want to proceed and therefore she was intubated in the ER.  Peri-intubation, she had a severe spike in her blood pressure up to [id number] systolic. Cerebral angio revealed a R distal M1 occlusion followed by unsuccessful revascularization. She was admitted to neuro ICU.   HOSPITAL COURSE Ms. COTI MANDERFELD is a 78 y.o. female with history of myocardial infarction, hypertension, hyperlipidemia, diabetes, carotid artery occlusion on the left, CAD and arthritispresenting with L sided neglect, R eye deviation and L side flaccid.Received tPA 11/20/2018 at 1545. CTA showed R M1 occlusion. Taken to IR, which was unsuccessful. SBP >220.   Stroke:R MCA insular/frontoparietal infarct due to right ICA and MCA occlusion s/p tPA and unsuccessful IR -embolic likely from new diagnosed AF  Code Stroke CT headsubtle hyperdense R MCA.  CTA head & neckR M1 Occlusion w/ poor flor R M2 and R M3. R CEA 2008 w/ patent bifurcation. Severe L ICA bifurcation atherosclerosis w/ occlusion L ICA.   Cerebral angio occluded R M1. Unsuccessful revascularization after 2 passes  w/ embotrap and penumbra aspiration d/t underlying hard atherosclerosis and  vascular tortuosity.  Post IR CT no ICH or mass effect  MRIR MCA infarct insular and frontoparietal. Old R cerebellar, L occipital, L frontoparietal vertex infarcts.  CT repeat 9/5 right large infarct with 61mm MLS, no hemorrhage  2D EchoEF 35-40%. LA mildly dilated. No source of embolus   LDL72  HgbA1c6.6   EEG 11/25/18 - evidence of a right hemispheric dysfunction which is consistent with the patient's known right hemispheric infarct.  Eliquisfor VTE prophylaxis  aspirin 81 mg daily andpletalprior to admission, now on aspirin 81mg  and eliquis 5 mg bid.   Therapy recommendations:SNF - son wants to take home with [facility]. HH PT, OT, SLP and RN ordered. Equipment (bed, hoyer, w/c) ordered. Tube feedings ordered (hospital will send home with a case of 24 Osmolite 1.5 cans to last through the weekend until DME company can deliver home Rx of Osmolite 1.2 cans next week).   Disposition:home with som(lives alone, son checks on daily)  Acute Respiratory Failure w/ hypoxia, resolved  Intubated for IR, not extubated at the end of the procedure d/t tPA administration and sheath left in  Sheath now out  Extubated 9/2   Cheyne stokes breathing pattern 11/25/18   Much improved and now resolved   Atrial Fibrillation w/ RVR, confirmed, new dx PSVT Cardiomyopathy  telemetry monitoring showed AF    Cardiology consulted, appreciate help, now signed off  Off cardizem givem cardiomyopathy    Metoprolol 50 mg bid  EF 35-40%, euvolemic  - on irbesartan and BB. Off lasix. Recommend no enestro.    Now on Eliquis. D/c on Eliquis.  F/u w/ cardiology as outpt  Dysphagia Severe Malnutrition  Secondary to stroke  Failed MBSS, did poorly with repeat MBSS  S/p PEG 9/11  Restarted continuous tube feeding 9/11  Home TFs ordered - will change to bolus feeds at home, 5x day - hospital will send home with a  case of 24 Osmolite 1.5 cans to last through the weekend until DME company can fill and deliver home Rx of Osmolite 1.2 cans next week.   Continue free water flush 150cc Q4h  Carotid Disease  L ICA occlusion  Hx of right CEA 2008  Hypertension Hypertensive Urgency, resolved  SBP > 220 in ED, as high as 222/128  Home meds:norvasc 10, clonidine 0.1 bid, labetalol 200 bid, valsartan 320-12.5 daily  Treated w/ cleviprex, Now off.   Now on avapro, metoprolol, cardizem, HCTZ 13 mg  BP stable  Long-term BP goal 130-150 given right ICA occlusion  Hyperlipidemia  Home meds:zocor 20  LDL 72, goal < 70  Resume zocor 20  Continue statin at discharge  Diabetes type II Controlled  Home meds:metformin 500  HgbA1c 6.6, goal < 7.0  Mild hyperglycemia  On metformin 850mg  bid  CBGs  Other Stroke Risk Factors  Advanced age  FormerCigarette smoker  Coronary artery diseases/p ? MI 20 yrs ago. No noted cardiac cath  Other Active Problems  R DP and PT not dopplerable prior to or post IR  Acute blood loss anemia d/t procedure, stable 13.9->9.2->10.4->10.1->10.2->10.4->10.4->10.0  Urinary retention s/p multiple I&Os - on foley, UA neg - d/c foley 9/9. D/t ongoing retention, re-inserted 9/10  Hypokalemia - K 3.1-> supplement ->4.3->4.4->4.1->4.0->4.0  DISCHARGE EXAM Blood pressure (!) 161/68, pulse 72, temperature 98.4 F (36.9 C), temperature source Oral, resp. rate 18, height 5\' 1"  (1.549 m), weight 44.4 kg, SpO2 100 %. Elderly African-American lady awake alert in chair. Afebrile. Head is nontraumatic. Neck is supple without bruit. Cardiac  exam no murmur or gallop. Lungs with coarse rhonchi. Distal pulses are well felt.  NeurologicalExam: Patient is awake alert sitting in chair, severe dysarthria but orientated to place, age, self and year, but not to month, followed most simple commands. She has right gaze deviation. She is not able to follow  gaze to the left. She blinks to threat inconsistently bilaterally. Pupils equal reactive. Fundi not visualized.left facial droop. cortrak tube in place. She does move right upper and lower extremity against gravity. She withdraws left lower extremity mildly to pain. Does not withdraw left upper extremity. Tone is diminished on the left and normal on the right. Right plantars downgoing left is equivocal.  Discharge Diet   Tube feedings  DISCHARGE PLAN  Disposition:  Home with family  Home health PT, OT, SLP and RN  Home DME: TF, hospital bed w/ hoyer, w/c w/ cushion, suction  Will need CBG checks and foley maintenance. Attempt foley removal again in 1 week.    aspirin 81 mg daily and Eliquis (apixaban) daily for secondary stroke prevention   Ongoing stroke risk factor control by Primary Care Physician at time of discharge  Follow-up Sandhu, Primitivo Gauze, MD in 2 weeks.  Follow-up in [facility] in 4 weeks, office to schedule an appointment.   65 minutes were spent preparing discharge.  Burnetta Sabin, MSN, APRN, ANVP-BC, AGPCNP-BC Advanced Practice Stroke Nurse [place] for Schedule & Pager information 12/02/2018 11:56 AM  I have personally obtained history,examined this patient, reviewed notes, independently viewed imaging studies, participated in medical decision making and plan of care.ROS completed by me personally and pertinent positives fully documented  I have made any additions or clarifications directly to the above note. Agree with note above.    Antony Contras, MD Medical Director [facility] Pager: [phone] 12/06/2018 3:34 PM

## 2018-12-01 NOTE — Progress Notes (Signed)
Physical Therapy Treatment Patient Details Name: [name] MRN: [id number] DOB: 1940/05/14 Today's Date: 12/01/2018    History of Present Illness 78 yo female admitted with R cerebral infarct frontoparietal infarect with failed attempt to revascularization and extubated 9/2 PMH:hx of CAD, MI 20 years ago [facility]?), cardiomyopathy with EF of 30-35% by echo, bilateral carotid artery stenosis with left ICA occlsuion and right carotid endarterectomy (2008), CVA (?2015), HTN, HLD, DM, and mitral valve insufficiency      PT Comments    Patient seen for mobility progression. Pt is speaking much more today vs previous session and mostly appropriate to situation. Pt continues to require +2 assist for all mobility. Continue to progress as tolerated with anticipated d/c to SNF for further skilled PT services.     Follow Up Recommendations  SNF;Supervision/Assistance - 24 hour     Equipment Recommendations  Other (comment)(TBD)    Recommendations for Other Services       Precautions / Restrictions Precautions Precautions: Fall Precaution Comments: L hemi, R gaze preference, L neglect Restrictions Weight Bearing Restrictions: No    Mobility  Bed Mobility Overal bed mobility: Needs Assistance Bed Mobility: Rolling;Sidelying to Sit Rolling: Total assist;+2 for physical assistance Sidelying to sit: Total assist;+2 for physical assistance;+2 for safety/equipment       General bed mobility comments: total assist to advance BUE to EOB and elevate trunk  Transfers Overall transfer level: Needs assistance Equipment used: 2 person hand held assist Transfers: Sit to/from Stand;Stand Pivot Transfers Sit to Stand: +2 physical assistance;+2 safety/equipment;Max assist Stand pivot transfers: Total assist;+2 physical assistance;+2 safety/equipment       General transfer comment: +2 face to face method utilized. requires physical assist to step with RLE and pivot hips towards  recliner.  Ambulation/Gait             General Gait Details: Unable   Marine scientist Rankin (Stroke Patients Only) Modified Rankin (Stroke Patients Only) Pre-Morbid Rankin Score: Slight disability Modified Rankin: Severe disability     Balance Overall balance assessment: Needs assistance Sitting-balance support: Feet supported;Single extremity supported Sitting balance-Leahy Scale: Poor Sitting balance - Comments: MAX A to maintain EOB sitting; pt with heavy L lean; manual facilitation to L side to assist with sitting at midline Postural control: Left lateral lean Standing balance support: Bilateral upper extremity supported;Single extremity supported Standing balance-Leahy Scale: Zero Standing balance comment: max-totalA+2 for standing balance                            Cognition Arousal/Alertness: Awake/alert Behavior During Therapy: Flat affect Overall Cognitive Status: Impaired/Different from baseline Area of Impairment: Attention;Memory;Awareness;Following commands;Problem solving                   Current Attention Level: Focused Memory: Decreased recall of precautions;Decreased short-term memory Following Commands: Follows one step commands with increased time;Follows one step commands inconsistently Safety/Judgement: Decreased awareness of safety;Decreased awareness of deficits Awareness: Intellectual Problem Solving: Slow processing;Decreased initiation;Difficulty sequencing;Requires verbal cues;Requires tactile cues General Comments: pt speaking much more this session       Exercises      General Comments        Pertinent Vitals/Pain Pain Assessment: Faces Faces Pain Scale: No hurt    Home Living  Prior Function            PT Goals (current goals can now be found in the care plan section) Acute Rehab PT Goals Patient Stated Goal: to see  [name] Progress towards PT goals: Progressing toward goals    Frequency    Min 3X/week      PT Plan Current plan remains appropriate    Co-evaluation PT/OT/SLP Co-Evaluation/Treatment: Yes Reason for Co-Treatment: Complexity of the patient's impairments (multi-system involvement);For patient/therapist safety;To address functional/ADL transfers;Necessary to address cognition/behavior during functional activity PT goals addressed during session: Mobility/safety with mobility;Balance OT goals addressed during session: ADL's and self-care;Strengthening/ROM      AM-PAC PT "6 Clicks" Mobility   Outcome Measure  Help needed turning from your back to your side while in a flat bed without using bedrails?: Total Help needed moving from lying on your back to sitting on the side of a flat bed without using bedrails?: Total Help needed moving to and from a bed to a chair (including a wheelchair)?: Total Help needed standing up from a chair using your arms (e.g., wheelchair or bedside chair)?: Total Help needed to walk in hospital room?: Total Help needed climbing 3-5 steps with a railing? : Total 6 Click Score: 6    End of Session Equipment Utilized During Treatment: Gait belt Activity Tolerance: Patient tolerated treatment well Patient left: in chair;with call bell/phone within reach;with chair alarm set Nurse Communication: Mobility status PT Visit Diagnosis: Hemiplegia and hemiparesis;Difficulty in walking, not elsewhere classified (R26.2) Hemiplegia - Right/Left: Left Hemiplegia - dominant/non-dominant: Non-dominant Hemiplegia - caused by: Cerebral infarction     Time: 1213-1242 PT Time Calculation (min) (ACUTE ONLY): 29 min  Charges:  $Gait Training: 8-22 mins                     Earney Navy, PTA Acute Rehabilitation Services Pager: [phone] Office: [phone]     Darliss Cheney 12/01/2018, 1:50 PM

## 2018-12-01 NOTE — Progress Notes (Signed)
Tube feeding stopped at 2345 11/30/18 Pt NPO for peg placement  continue to be on heparin drip @8 .29ml/hr which will be stopped 3 hrs to surgery.

## 2018-12-01 NOTE — Anesthesia Postprocedure Evaluation (Signed)
Anesthesia Post Note  Patient: [name]  Procedure(s) Performed: ESOPHAGOGASTRODUODENOSCOPY (EGD) (N/A ) PERCUTANEOUS ENDOSCOPIC GASTROSTOMY (PEG) PLACEMENT (N/A )     Anesthesia Post Evaluation  Last Vitals:  Vitals:   12/01/18 0801 12/01/18 0810  BP: (!) 144/46 (!) 138/41  Pulse: 72 64  Resp: (!) 25 14  Temp: 36.8 C   SpO2: 100% 98%    Last Pain:  Vitals:   12/01/18 0810  TempSrc:   PainSc: 0-No pain                 Pervis Hocking

## 2018-12-01 NOTE — Transfer of Care (Signed)
Immediate Anesthesia Transfer of Care Note  Patient: [name]  Procedure(s) Performed: ESOPHAGOGASTRODUODENOSCOPY (EGD) (N/A ) PERCUTANEOUS ENDOSCOPIC GASTROSTOMY (PEG) PLACEMENT (N/A )  Patient Location: Endoscopy Unit  Anesthesia Type:MAC  Level of Consciousness: drowsy  Airway & Oxygen Therapy: Patient Spontanous Breathing and Patient connected to nasal cannula oxygen  Post-op Assessment: Report given to RN, Post -op Vital signs reviewed and stable and Patient moving all extremities  Post vital signs: Reviewed and stable  Last Vitals:  Vitals Value Taken Time  BP    Temp    Pulse    Resp    SpO2      Last Pain:  Vitals:   12/01/18 0705  TempSrc: Axillary  PainSc:       Patients Stated Pain Goal: 0 (03/54/65 6812)  Complications: No apparent anesthesia complications

## 2018-12-01 NOTE — Progress Notes (Signed)
Nutrition Follow-up  DOCUMENTATION CODES:   Severe malnutrition in context of chronic illness  INTERVENTION:   Transition feeds to bolus tube feeds for anticipation of discharge home,  Recommend starting bolus feed using Osmolite 1.2 formula via PEG at volume of 120 ml (half carton/ARC) and advance by 120 ml at feedings to goal volume of 237 ml (1 carton/ARC) given 5 times daily.  Provide 30 ml Prostat (or equivalent) once daily per tube.   Free water flushes of 200 ml every 4 hours per MD. Given between bolus feeds.   Tube feeding regimen to provide 1522 kcal (100% of needs), 80 grams of protein, 2172 ml free water.   NUTRITION DIAGNOSIS:   Severe Malnutrition related to chronic illness as evidenced by severe muscle depletion, severe fat depletion; ongoing  GOAL:   Patient will meet greater than or equal to 90% of their needs; met with TF  MONITOR:   TF tolerance, Skin, Weight trends, Labs, I & O's  REASON FOR ASSESSMENT:   Consult Enteral/tube feeding initiation and management  ASSESSMENT:   78 year old female with history of CAD, chonic left ICA occlusion and known right ICA stenosis, T2DM, HTN, HLD, MI. Patient found laying in bed by her son with rightward gaze, left-sided facial droop, and dysarthric speech stating that she was unable to get up. Patient admitted with thrombotic R MCA CVA secondary to M1 occlusion, chronic bilateral ICA stenosis  8/31 - s/p cerebral arteriogram with unsuccessful mechanical thrombectomy and unsuccessful intracrainal rescue stenting, intubated 9/02 - extubated 9/04- Cortrak NGT placed. Tip of tube in stomach.  9/11- PEG placed   Orders in place to restart TF via PEG. Plans for pt to discharge home tomorrow. RD to transition feeds to bolus tube feeds for ease of nutrition administration at home. RD to modify tube feeds. Labs and medications reviewed.   Diet Order:   Diet Order            Diet NPO time specified  Diet effective  midnight              EDUCATION NEEDS:   No education needs have been identified at this time  Skin:  Skin Assessment: Reviewed RN Assessment  Last BM:  9/10  Height:   Ht Readings from Last 1 Encounters:  11/20/18 '5\' 1"'  (1.549 m)    Weight:   Wt Readings from Last 1 Encounters:  12/01/18 46.2 kg    Ideal Body Weight:  47.7 kg  BMI:  Body mass index is 19.24 kg/m.  Estimated Nutritional Needs:   Kcal:  1400-1600  Protein:  65-80 grams  Fluid:  >/= 1.5 L/day    Corrin Parker, MS, RD, LDN Pager # [phone] After hours/ weekend pager # [phone]

## 2018-12-01 NOTE — Op Note (Signed)
[facility] Patient Name: [name] Procedure Date : 12/01/2018 MRN: [id number] Attending MD: Georganna Skeans , MD Date of Birth: Sep 17, 1940 CSN: [id number] Age: 78 Admit Type: Inpatient Procedure:                Upper GI endoscopy Indications:              Place PEG because patient is unable to eat due to                            stroke (CVA) Providers:                Georganna Skeans, MD, Saverio Danker, PA-C, Angus Seller, Maggie Chrismon, RN, Lina Sar,                            Technician, Cherylynn Ridges, Technician, Claybon Jabs                            CRNA, CRNA Referring MD:             Erlinda Hong Medicines:                Propofol per Anesthesia Complications:            No immediate complications. Estimated Blood Loss:     Estimated blood loss was minimal. Procedure:                Pre-Anesthesia Assessment:                           - Prior to the procedure, a History and Physical                            was performed, and patient medications and                            allergies were reviewed. The patient is unable to                            give consent secondary to the patient's altered                            mental status. The risks and benefits of the                            procedure and the sedation options and risks were                            discussed with the patient's son. All questions                            were answered and informed consent was obtained.  Patient identification and proposed procedure were                            verified by the physician, the nurse, the                            anesthetist and the technician in the procedure                            room. Mental Status Examination: alert but                            confused. ASA Grade Assessment: III - A patient                            with severe systemic disease. After reviewing the                             risks and benefits, the patient was deemed in                            satisfactory condition to undergo the procedure.                            The anesthesia plan was to use moderate sedation /                            analgesia (conscious sedation). Immediately prior                            to administration of medications, the patient was                            re-assessed for adequacy to receive sedatives. The                            heart rate, respiratory rate, oxygen saturations,                            blood pressure, adequacy of pulmonary ventilation,                            and response to care were monitored throughout the                            procedure. The physical status of the patient was                            re-assessed after the procedure.                           After obtaining informed consent, the endoscope was  passed under direct vision. Throughout the                            procedure, the patient's blood pressure, pulse, and                            oxygen saturations were monitored continuously. The                            [id number] Olympus gastroscope was                            introduced through the mouth, and advanced to the                            duodenal bulb. The upper GI endoscopy was                            accomplished with ease. The patient tolerated the                            procedure well. Scope In: Scope Out: Findings:      No gross lesions were noted in the esophagus.      No gross lesions were noted in the stomach. Placement of an externally       removable PEG with no T-fasteners was successfully completed. The       external bumper was at the 3.0 cm marking on the tube.      No gross lesions were noted in the duodenal bulb. Impression:               - No gross lesions in esophagus.                           - No gross  lesions in the stomach.                           - No gross lesions in the duodenal bulb.                           - An externally removable PEG placement was                            successfully completed.                           - No specimens collected. Recommendation:           meds now, resume tube feeds in 4h, resume heparin                            drip at 1400 Procedure Code(s):        --- Professional ---                           [phone], Esophagogastroduodenoscopy, flexible,  transoral; with directed placement of percutaneous                            gastrostomy tube Diagnosis Code(s):        --- Professional ---                           [id number], Other sequelae of cerebral infarction                           R63.3, Feeding difficulties                           Z43.1, Encounter for attention to gastrostomy CPT copyright 2019 [employer]. All rights reserved. The codes documented in this report are preliminary and upon coder review may  be revised to meet current compliance requirements. Georganna Skeans, MD 12/01/2018 8:06:53 AM This report has been signed electronically. Number of Addenda: 0

## 2018-12-02 LAB — CBC
HCT: 33.7 % — ABNORMAL LOW (ref 36.0–46.0)
Hemoglobin: 10.8 g/dL — ABNORMAL LOW (ref 12.0–15.0)
MCH: 27.6 pg (ref 26.0–34.0)
MCHC: 32 g/dL (ref 30.0–36.0)
MCV: 86.2 fL (ref 80.0–100.0)
Platelets: 397 10*3/uL (ref 150–400)
RBC: 3.91 MIL/uL (ref 3.87–5.11)
RDW: 16.4 % — ABNORMAL HIGH (ref 11.5–15.5)
WBC: 12.3 10*3/uL — ABNORMAL HIGH (ref 4.0–10.5)
nRBC: 0 % (ref 0.0–0.2)

## 2018-12-02 LAB — BASIC METABOLIC PANEL
Anion gap: 14 (ref 5–15)
BUN: 22 mg/dL (ref 8–23)
CO2: 25 mmol/L (ref 22–32)
Calcium: 9.5 mg/dL (ref 8.9–10.3)
Chloride: 99 mmol/L (ref 98–111)
Creatinine, Ser: 1.03 mg/dL — ABNORMAL HIGH (ref 0.44–1.00)
GFR calc Af Amer: >60 mL/min (ref >60)
GFR calc non Af Amer: 53 mL/min — ABNORMAL LOW (ref >60)
Glucose, Bld: 186 mg/dL — ABNORMAL HIGH (ref 70–99)
Potassium: 4 mmol/L (ref 3.5–5.1)
Sodium: 138 mmol/L (ref 135–145)

## 2018-12-02 LAB — GLUCOSE, CAPILLARY
Glucose-Capillary: 150 mg/dL — ABNORMAL HIGH (ref 70–99)
Glucose-Capillary: 161 mg/dL — ABNORMAL HIGH (ref 70–99)
Glucose-Capillary: 185 mg/dL — ABNORMAL HIGH (ref 70–99)

## 2018-12-02 LAB — HEPARIN LEVEL (UNFRACTIONATED): Heparin Unfractionated: >2.2 IU/mL — ABNORMAL HIGH (ref 0.30–0.70)

## 2018-12-02 LAB — APTT: aPTT: 39 seconds — ABNORMAL HIGH (ref 24–36)

## 2018-12-02 MED ORDER — SIMVASTATIN 20 MG PO TABS: 20.0000 mg | ORAL_TABLET | Freq: Every day | ORAL | 2 refills | Status: AC

## 2018-12-02 MED ORDER — ASPIRIN 81 MG PO CHEW: 81.0000 mg | CHEWABLE_TABLET | Freq: Every day | ORAL | 2 refills | Status: AC

## 2018-12-02 MED ORDER — HYDROCHLOROTHIAZIDE 12.5 MG PO CAPS: 12.5000 mg | ORAL_CAPSULE | Freq: Every day | ORAL | 2 refills | Status: DC

## 2018-12-02 MED ORDER — SENNOSIDES 8.8 MG/5ML PO SYRP: 5.0000 mL | ORAL_SOLUTION | Freq: Every day | ORAL | 0 refills | Status: DC

## 2018-12-02 MED ORDER — DICYCLOMINE HCL 10 MG PO CAPS: 10.0000 mg | ORAL_CAPSULE | Freq: Four times a day (QID) | ORAL | 2 refills | Status: AC | PRN

## 2018-12-02 MED ORDER — APIXABAN 5 MG PO TABS: 5.0000 mg | ORAL_TABLET | Freq: Two times a day (BID) | ORAL | 2 refills | Status: DC

## 2018-12-02 MED ORDER — METFORMIN HCL 850 MG PO TABS: 850.0000 mg | ORAL_TABLET | Freq: Two times a day (BID) | ORAL | 2 refills | Status: DC

## 2018-12-02 MED ORDER — PANTOPRAZOLE SODIUM 40 MG PO PACK: 40.0000 mg | PACK | Freq: Every day | ORAL | 2 refills | Status: AC

## 2018-12-02 MED ORDER — PRO-STAT SUGAR FREE PO LIQD: 30.0000 mL | Freq: Every day | ORAL | 0 refills | Status: DC

## 2018-12-02 MED ORDER — METOPROLOL TARTRATE 50 MG PO TABS: 50.0000 mg | ORAL_TABLET | Freq: Two times a day (BID) | ORAL | 2 refills | Status: DC

## 2018-12-02 MED ORDER — IRBESARTAN 300 MG PO TABS: 300.0000 mg | ORAL_TABLET | Freq: Every day | ORAL | 2 refills | Status: DC

## 2018-12-02 NOTE — Progress Notes (Signed)
Discharge instructions reviewed with patient's son. All belongings sent with son. Patient will be discharge to home per [facility].  Ave Filter, RN

## 2018-12-02 NOTE — Progress Notes (Signed)
S: [name] is a 78 yo female s/p tpa and IR for embolic stroke involving right middle cerebral artery on 11/20/2018, and PEG tube placement 12/01/2018.  Patient had no events overnight.  She is tolerating tube feeds with PEG tube well.  She has no significant abdominal pain, and no nausea/vomiting.     O: BP (!) 154/60 (BP Location: Right Arm)   Pulse 89   Temp (!) 97.5 F (36.4 C) (Oral)   Resp 13   Ht 5\' 1"  (1.549 m)   Wt 44.4 kg   SpO2 100%   BMI 18.50 kg/m  Ab: Normoactive bowel sounds, no abdominal guarding or pain with palpation, PEG site clean, no signs of infection.   A: Patient is a 78 yo female s/p tpa and IR for embolic stroke and PEG tube insertion.   P: Patient will be discharged under care of home health care.  She will continue feeds with PEG tube as ordered.

## 2018-12-02 NOTE — TOC Transition Note (Signed)
Transition of Care [facility]) - CM/SW Discharge Note   Patient Details  Name: [name] MRN: [id number] Date of Birth: [birth year]  Transition of Care [facility]) CM/SW Contact:  Pollie Friar, RN Phone Number: 12/02/2018, 1:07 PM   Clinical Narrative:    Pt discharging to son's home today. Pt has tube feeding for 5 days at the home and the other will be delivered. Son to pick up pt's medications at [facility] in [place].  [facility] to provide transport home. Bedside RN updated and d/c packet at the desk. Son is aware.   Final next level of care: Home w Home Health Services Barriers to Discharge: No Barriers Identified   Patient Goals and CMS Choice   CMS Medicare.gov Compare Post Acute Care list provided to:: Patient Represenative (must comment) Choice offered to / list presented to : Adult Children  Discharge Placement                       Discharge Plan and Services In-house Referral: Clinical Social Work Discharge Planning Services: CM Consult            DME Arranged: Hospital bed, Wheelchair manual, Suction, Tube feeding DME Agency: Other - Comment(Family medical)       HH Arranged: PT, OT, RN, Speech Therapy HH Agency: [facility]     Representative spoke with at [place]: Dorian Pod made aware of d/c home today  Social Determinants of Health (SDOH) Interventions     Readmission Risk Interventions No flowsheet data found.

## 2018-12-02 NOTE — Progress Notes (Addendum)
Patient's son at bedside. RN reviewed AVS. Family verbalized understanding the use of obtaining blood sugar, frequent turns to prevent skin breakdown, oral and peri care daily, how to manage PEG tube along with medications and feeding administrations. Teach back incorporated.Ave Filter, RN

## 2018-12-03 ENCOUNTER — Encounter (HOSPITAL_COMMUNITY): Payer: Self-pay | Admitting: General Surgery

## 2018-12-04 NOTE — Addendum Note (Signed)
Addendum  created 12/04/18 1847 by Pervis Hocking, DO   Clinical Note Signed

## 2018-12-04 NOTE — Anesthesia Postprocedure Evaluation (Signed)
Anesthesia Post Note  Patient: [place] B Formoso  Procedure(s) Performed: ESOPHAGOGASTRODUODENOSCOPY (EGD) (N/A ) PERCUTANEOUS ENDOSCOPIC GASTROSTOMY (PEG) PLACEMENT (N/A )     Patient location during evaluation: PACU Anesthesia Type: MAC Level of consciousness: awake and alert Pain management: pain level controlled Vital Signs Assessment: post-procedure vital signs reviewed and stable Respiratory status: spontaneous breathing, nonlabored ventilation and respiratory function stable Cardiovascular status: blood pressure returned to baseline and stable Postop Assessment: no apparent nausea or vomiting Anesthetic complications: no    Last Vitals:  Vitals:   12/02/18 0953 12/02/18 1231  BP: (!) 161/68 (!) 154/102  Pulse: 72 (!) 104  Resp: 18 18  Temp: 36.9 C (!) 36.4 C  SpO2:  100%    Last Pain:  Vitals:   12/02/18 1231  TempSrc: Oral  PainSc:                  Pervis Hocking

## 2018-12-18 ENCOUNTER — Other Ambulatory Visit: Payer: Self-pay

## 2018-12-18 ENCOUNTER — Encounter (HOSPITAL_COMMUNITY): Payer: Self-pay | Admitting: Emergency Medicine

## 2018-12-18 ENCOUNTER — Emergency Department (HOSPITAL_COMMUNITY): Payer: Medicare Other

## 2018-12-18 ENCOUNTER — Inpatient Hospital Stay (HOSPITAL_COMMUNITY)
Admission: EM | Admit: 2018-12-18 | Discharge: 2018-12-26 | DRG: 689 | Disposition: A | Discharge status: Home Health | Payer: Medicare Other | Attending: Internal Medicine | Admitting: Internal Medicine

## 2018-12-18 DIAGNOSIS — I69391 Dysphagia following cerebral infarction: Secondary | ICD-10-CM

## 2018-12-18 DIAGNOSIS — I252 Old myocardial infarction: Secondary | ICD-10-CM | POA: Diagnosis not present

## 2018-12-18 DIAGNOSIS — G9341 Metabolic encephalopathy: Secondary | ICD-10-CM | POA: Diagnosis present

## 2018-12-18 DIAGNOSIS — E860 Dehydration: Secondary | ICD-10-CM | POA: Diagnosis present

## 2018-12-18 DIAGNOSIS — Z87891 Personal history of nicotine dependence: Secondary | ICD-10-CM

## 2018-12-18 DIAGNOSIS — R4182 Altered mental status, unspecified: Secondary | ICD-10-CM

## 2018-12-18 DIAGNOSIS — Z8673 Personal history of transient ischemic attack (TIA), and cerebral infarction without residual deficits: Secondary | ICD-10-CM | POA: Diagnosis not present

## 2018-12-18 DIAGNOSIS — Z79899 Other long term (current) drug therapy: Secondary | ICD-10-CM

## 2018-12-18 DIAGNOSIS — Z7982 Long term (current) use of aspirin: Secondary | ICD-10-CM

## 2018-12-18 DIAGNOSIS — Z8249 Family history of ischemic heart disease and other diseases of the circulatory system: Secondary | ICD-10-CM

## 2018-12-18 DIAGNOSIS — E870 Hyperosmolality and hypernatremia: Secondary | ICD-10-CM | POA: Diagnosis present

## 2018-12-18 DIAGNOSIS — E872 Acidosis, unspecified: Secondary | ICD-10-CM

## 2018-12-18 DIAGNOSIS — I472 Ventricular tachycardia: Secondary | ICD-10-CM | POA: Diagnosis not present

## 2018-12-18 DIAGNOSIS — N390 Urinary tract infection, site not specified: Principal | ICD-10-CM | POA: Diagnosis present

## 2018-12-18 DIAGNOSIS — Z833 Family history of diabetes mellitus: Secondary | ICD-10-CM

## 2018-12-18 DIAGNOSIS — I480 Paroxysmal atrial fibrillation: Secondary | ICD-10-CM | POA: Diagnosis not present

## 2018-12-18 DIAGNOSIS — E785 Hyperlipidemia, unspecified: Secondary | ICD-10-CM | POA: Diagnosis present

## 2018-12-18 DIAGNOSIS — Z7984 Long term (current) use of oral hypoglycemic drugs: Secondary | ICD-10-CM | POA: Diagnosis not present

## 2018-12-18 DIAGNOSIS — E46 Unspecified protein-calorie malnutrition: Secondary | ICD-10-CM | POA: Diagnosis present

## 2018-12-18 DIAGNOSIS — E119 Type 2 diabetes mellitus without complications: Secondary | ICD-10-CM | POA: Diagnosis not present

## 2018-12-18 DIAGNOSIS — I4891 Unspecified atrial fibrillation: Secondary | ICD-10-CM

## 2018-12-18 DIAGNOSIS — E1151 Type 2 diabetes mellitus with diabetic peripheral angiopathy without gangrene: Secondary | ICD-10-CM | POA: Diagnosis present

## 2018-12-18 DIAGNOSIS — N179 Acute kidney failure, unspecified: Secondary | ICD-10-CM

## 2018-12-18 DIAGNOSIS — R1310 Dysphagia, unspecified: Secondary | ICD-10-CM | POA: Diagnosis present

## 2018-12-18 DIAGNOSIS — I10 Essential (primary) hypertension: Secondary | ICD-10-CM | POA: Diagnosis present

## 2018-12-18 DIAGNOSIS — Z66 Do not resuscitate: Secondary | ICD-10-CM | POA: Diagnosis present

## 2018-12-18 DIAGNOSIS — R195 Other fecal abnormalities: Secondary | ICD-10-CM

## 2018-12-18 DIAGNOSIS — D649 Anemia, unspecified: Secondary | ICD-10-CM | POA: Diagnosis present

## 2018-12-18 DIAGNOSIS — I63419 Cerebral infarction due to embolism of unspecified middle cerebral artery: Secondary | ICD-10-CM | POA: Diagnosis not present

## 2018-12-18 DIAGNOSIS — Z7901 Long term (current) use of anticoagulants: Secondary | ICD-10-CM | POA: Diagnosis not present

## 2018-12-18 DIAGNOSIS — R1319 Other dysphagia: Secondary | ICD-10-CM | POA: Diagnosis not present

## 2018-12-18 DIAGNOSIS — L8990 Pressure ulcer of unspecified site, unspecified stage: Secondary | ICD-10-CM | POA: Insufficient documentation

## 2018-12-18 DIAGNOSIS — E118 Type 2 diabetes mellitus with unspecified complications: Secondary | ICD-10-CM | POA: Diagnosis not present

## 2018-12-18 DIAGNOSIS — I69354 Hemiplegia and hemiparesis following cerebral infarction affecting left non-dominant side: Secondary | ICD-10-CM

## 2018-12-18 DIAGNOSIS — B965 Pseudomonas (aeruginosa) (mallei) (pseudomallei) as the cause of diseases classified elsewhere: Secondary | ICD-10-CM | POA: Diagnosis present

## 2018-12-18 DIAGNOSIS — I2510 Atherosclerotic heart disease of native coronary artery without angina pectoris: Secondary | ICD-10-CM | POA: Diagnosis present

## 2018-12-18 DIAGNOSIS — N19 Unspecified kidney failure: Secondary | ICD-10-CM | POA: Diagnosis not present

## 2018-12-18 DIAGNOSIS — Z20828 Contact with and (suspected) exposure to other viral communicable diseases: Secondary | ICD-10-CM | POA: Diagnosis present

## 2018-12-18 DIAGNOSIS — K922 Gastrointestinal hemorrhage, unspecified: Secondary | ICD-10-CM | POA: Diagnosis not present

## 2018-12-18 DIAGNOSIS — D62 Acute posthemorrhagic anemia: Secondary | ICD-10-CM | POA: Diagnosis not present

## 2018-12-18 DIAGNOSIS — A499 Bacterial infection, unspecified: Secondary | ICD-10-CM | POA: Diagnosis present

## 2018-12-18 DIAGNOSIS — N3000 Acute cystitis without hematuria: Secondary | ICD-10-CM

## 2018-12-18 DIAGNOSIS — E11649 Type 2 diabetes mellitus with hypoglycemia without coma: Secondary | ICD-10-CM | POA: Diagnosis not present

## 2018-12-18 DIAGNOSIS — L8942 Pressure ulcer of contiguous site of back, buttock and hip, stage 2: Secondary | ICD-10-CM | POA: Diagnosis not present

## 2018-12-18 DIAGNOSIS — Z931 Gastrostomy status: Secondary | ICD-10-CM

## 2018-12-18 DIAGNOSIS — N186 End stage renal disease: Secondary | ICD-10-CM

## 2018-12-18 DIAGNOSIS — L89322 Pressure ulcer of left buttock, stage 2: Secondary | ICD-10-CM | POA: Diagnosis present

## 2018-12-18 DIAGNOSIS — Z794 Long term (current) use of insulin: Secondary | ICD-10-CM | POA: Diagnosis not present

## 2018-12-18 HISTORY — DX: Cerebral infarction, unspecified: I63.9

## 2018-12-18 LAB — COMPREHENSIVE METABOLIC PANEL
ALT: 29 U/L (ref 0–44)
AST: 27 U/L (ref 15–41)
Albumin: 2 g/dL — ABNORMAL LOW (ref 3.5–5.0)
Alkaline Phosphatase: 48 U/L (ref 38–126)
Anion gap: 8 (ref 5–15)
BUN: 89 mg/dL — ABNORMAL HIGH (ref 8–23)
CO2: 17 mmol/L — ABNORMAL LOW (ref 22–32)
Calcium: 7.2 mg/dL — ABNORMAL LOW (ref 8.9–10.3)
Chloride: 125 mmol/L — ABNORMAL HIGH (ref 98–111)
Creatinine, Ser: 1.2 mg/dL — ABNORMAL HIGH (ref 0.44–1.00)
GFR calc Af Amer: 51 mL/min — ABNORMAL LOW (ref >60)
GFR calc non Af Amer: 44 mL/min — ABNORMAL LOW (ref >60)
Glucose, Bld: 172 mg/dL — ABNORMAL HIGH (ref 70–99)
Potassium: 3.8 mmol/L (ref 3.5–5.1)
Sodium: 150 mmol/L — ABNORMAL HIGH (ref 135–145)
Total Bilirubin: 0.5 mg/dL (ref 0.3–1.2)
Total Protein: 5 g/dL — ABNORMAL LOW (ref 6.5–8.1)

## 2018-12-18 LAB — CBC WITH DIFFERENTIAL/PLATELET
Abs Immature Granulocytes: 0.07 10*3/uL (ref 0.00–0.07)
Basophils Absolute: 0.1 10*3/uL (ref 0.0–0.1)
Basophils Relative: 0 %
Eosinophils Absolute: 0.1 10*3/uL (ref 0.0–0.5)
Eosinophils Relative: 1 %
HCT: 27.3 % — ABNORMAL LOW (ref 36.0–46.0)
Hemoglobin: 8.2 g/dL — ABNORMAL LOW (ref 12.0–15.0)
Immature Granulocytes: 1 %
Lymphocytes Relative: 14 %
Lymphs Abs: 1.7 10*3/uL (ref 0.7–4.0)
MCH: 26.8 pg (ref 26.0–34.0)
MCHC: 30 g/dL (ref 30.0–36.0)
MCV: 89.2 fL (ref 80.0–100.0)
Monocytes Absolute: 0.9 10*3/uL (ref 0.1–1.0)
Monocytes Relative: 7 %
Neutro Abs: 9.5 10*3/uL — ABNORMAL HIGH (ref 1.7–7.7)
Neutrophils Relative %: 77 %
Platelets: 480 10*3/uL — ABNORMAL HIGH (ref 150–400)
RBC: 3.06 MIL/uL — ABNORMAL LOW (ref 3.87–5.11)
RDW: 16.2 % — ABNORMAL HIGH (ref 11.5–15.5)
WBC: 12.2 10*3/uL — ABNORMAL HIGH (ref 4.0–10.5)
nRBC: 0.2 % (ref 0.0–0.2)

## 2018-12-18 LAB — URINALYSIS, ROUTINE W REFLEX MICROSCOPIC
Bilirubin Urine: NEGATIVE
Glucose, UA: NEGATIVE mg/dL
Hgb urine dipstick: NEGATIVE
Ketones, ur: NEGATIVE mg/dL
Nitrite: NEGATIVE
Protein, ur: NEGATIVE mg/dL
Specific Gravity, Urine: 1.016 (ref 1.005–1.030)
WBC, UA: >50 WBC/hpf — ABNORMAL HIGH (ref 0–5)
pH: 5 (ref 5.0–8.0)

## 2018-12-18 LAB — POC OCCULT BLOOD, ED: Fecal Occult Bld: POSITIVE — AB

## 2018-12-18 LAB — SARS CORONAVIRUS 2 (TAT 6-24 HRS): SARS Coronavirus 2: NEGATIVE

## 2018-12-18 MED ORDER — METOPROLOL TARTRATE 25 MG/10 ML ORAL SUSPENSION
50.0000 mg | Freq: Two times a day (BID) | ORAL | Status: DC
  Administered 2018-12-19 – 2018-12-25 (×14): 50 mg
  Filled 2018-12-18 (×16): qty 20

## 2018-12-18 MED ORDER — SODIUM CHLORIDE 0.9 % IV SOLN
INTRAVENOUS | Status: DC
  Administered 2018-12-18 (×2): via INTRAVENOUS

## 2018-12-18 MED ORDER — SIMVASTATIN 20 MG PO TABS
20.0000 mg | ORAL_TABLET | Freq: Every day | ORAL | Status: DC
  Administered 2018-12-19 – 2018-12-25 (×8): 20 mg
  Filled 2018-12-18 (×8): qty 1

## 2018-12-18 MED ORDER — ASPIRIN 81 MG PO CHEW
81.0000 mg | CHEWABLE_TABLET | Freq: Every day | ORAL | Status: DC
  Administered 2018-12-19 – 2018-12-26 (×8): 81 mg
  Filled 2018-12-18 (×9): qty 1

## 2018-12-18 MED ORDER — METOPROLOL TARTRATE 50 MG PO TABS: 50.0000 mg | ORAL_TABLET | Freq: Two times a day (BID) | ORAL | Status: DC

## 2018-12-18 MED ORDER — SODIUM CHLORIDE 0.9 % IV SOLN
1.0000 g | INTRAVENOUS | Status: DC
  Administered 2018-12-19: 1 g via INTRAVENOUS
  Filled 2018-12-18: qty 10

## 2018-12-18 MED ORDER — SODIUM CHLORIDE 0.9 % IV SOLN
1.0000 g | Freq: Once | INTRAVENOUS | Status: AC
  Administered 2018-12-18: 1 g via INTRAVENOUS
  Filled 2018-12-18: qty 10

## 2018-12-18 MED ORDER — PANTOPRAZOLE SODIUM 40 MG PO PACK
40.0000 mg | PACK | Freq: Every day | ORAL | Status: DC
  Administered 2018-12-19: 40 mg
  Filled 2018-12-18 (×2): qty 20

## 2018-12-18 MED ORDER — FREE WATER
150.0000 mL | Status: DC
  Administered 2018-12-19 – 2018-12-20 (×4): 150 mL

## 2018-12-18 MED ORDER — SODIUM CHLORIDE 0.9 % IV SOLN
1000.0000 mL | INTRAVENOUS | Status: DC
  Administered 2018-12-18: 1000 mL via INTRAVENOUS

## 2018-12-18 MED ORDER — IOHEXOL 350 MG/ML SOLN
75.0000 mL | Freq: Once | INTRAVENOUS | Status: AC | PRN
  Administered 2018-12-18: 75 mL via INTRAVENOUS

## 2018-12-18 MED ORDER — ENOXAPARIN SODIUM 40 MG/0.4ML ~~LOC~~ SOLN: 40.0000 mg | SUBCUTANEOUS | Status: DC

## 2018-12-18 MED ORDER — SODIUM CHLORIDE 0.9 % IV BOLUS (SEPSIS)
500.0000 mL | Freq: Once | INTRAVENOUS | Status: AC
  Administered 2018-12-18: 500 mL via INTRAVENOUS

## 2018-12-18 NOTE — H&P (Signed)
History and Physical    [name] [id number] DOB: 1940-10-12 DOA: 12/18/2018  PCP: Eloy End, MD  Patient coming from: Home and lives with son  I have personally briefly reviewed patient's old medical records in [place]  Chief Complaint: Altered mental status  HPI: [name] is a 78 y.o. female with medical history significant of  CAD, bilateral carotid artery stenosis status post right carotid enterectomy, myocardial infarction, hypertension, mitral valve insufficiency, atrial fibrillation on Eliquis, recent embolic stroke involving the right MCA status post TPA and unsuccessful IR who presented with altered mental status and increasing weakness.  Son at bedside provides history as patient has incoherent speech.  Patient was recently admitted from 8/31 to 9/12 with mechanical ventilation secondary to acute hypoxia respiratory failure with findings of embolic stroke involving the right MCA status post TPA and unsuccessful IR, and ultimately requiring PEG tube placement.  Since returning home, patient has had residual left-sided weakness and slurred speech at baseline.  Son reports that he was still talking to her and getting her up out of bed daily.  However, about 2 days ago she became more nonverbal and was sleeping a lot more and had 2 episodes of diarrhea prompting him to bring her into the emergency department today for further evaluation.  ED Course:  she was afebrile and normotensive but mildly tachycardic up to 103 on room air.  patient was laying comfortable in bed and was oriented to herself, her son only.  She was able to follow some simple commands but unable to answer any questions regarding her symptoms.  CBC shows mildly elevated WBC of 12.2 and a hemoglobin of 8.2 which is decreased from 10.8 two weeks ago.  Fecal occult blood was also positive.  CMP showed elevated sodium of 150 and increase creatinine of 1.20 and elevated BUN of 89.  Urinalysis was  positive for large leukocytes and negative nitrites with few bacteria, WBC and hyaline casts.  CTA Angie of the head showed evolving large right MCA territory infarct from prior but no new infarct identified.   Review of Systems:  Unable to obtain for review of system secondary to likely cognitive decline of the patient  Past Medical History:  Diagnosis Date   Arthritis    CAD (coronary artery disease)    Carotid artery occlusion    Diabetes mellitus    Hyperlipidemia    Hypertension    Myocardial infarction [facility])    Stroke [facility])     Past Surgical History:  Procedure Laterality Date   CAROTID ENDARTERECTOMY  01/06/2007   right - with Dacron patch angioplasty   ESOPHAGOGASTRODUODENOSCOPY N/A 12/01/2018   Procedure: ESOPHAGOGASTRODUODENOSCOPY (EGD);  Surgeon: Georganna Skeans, MD;  Location: [place];  Service: General;  Laterality: N/A;   HERNIA REPAIR  2012   IR CT HEAD LTD  11/20/2018   IR PERCUTANEOUS ART THROMBECTOMY/INFUSION INTRACRANIAL INC DIAG ANGIO  11/20/2018   PEG PLACEMENT N/A 12/01/2018   Procedure: PERCUTANEOUS ENDOSCOPIC GASTROSTOMY (PEG) PLACEMENT;  Surgeon: Georganna Skeans, MD;  Location: [place];  Service: General;  Laterality: N/A;   RADIOLOGY WITH ANESTHESIA N/A 11/20/2018   Procedure: CODE STROKE;  Surgeon: Luanne Bras, MD;  Location: [place];  Service: Radiology;  Laterality: N/A;     reports that she quit smoking about 31 years ago. Her smoking use included cigarettes. She smoked 1.00 pack per day. She has never used smokeless tobacco. She reports that she does not drink alcohol. No history on  file for drug.  No Known Allergies  Family History  Problem Relation Age of Onset   Heart disease Mother    Diabetes Mother    Heart disease Brother    Diabetes Brother    Family history reviewed and not pertinent   Prior to Admission medications   Medication Sig Start Date End Date Taking? Authorizing Provider  Amino Acids-Protein  Hydrolys (FEEDING SUPPLEMENT, PRO-STAT SUGAR FREE 64,) LIQD Place 30 mLs into feeding tube daily. 12/02/18   Donzetta Starch, NP  apixaban (ELIQUIS) 5 MG TABS tablet Place 1 tablet (5 mg total) into feeding tube every 12 (twelve) hours. Crush med prior to putting in tube and administer promptly 12/01/18   Donzetta Starch, NP  apixaban (ELIQUIS) 5 MG TABS tablet Place 1 tablet (5 mg total) into feeding tube 2 (two) times daily. 12/02/18   Donzetta Starch, NP  aspirin 81 MG chewable tablet Place 1 tablet (81 mg total) into feeding tube daily. Crush med prior to putting in tube 12/02/18   Donzetta Starch, NP  dicyclomine (BENTYL) 10 MG capsule Place 1 capsule (10 mg total) into feeding tube 4 (four) times daily as needed for spasms. Crush med prior to putting in tube 12/02/18   Donzetta Starch, NP  hydrochlorothiazide (MICROZIDE) 12.5 MG capsule Place 1 capsule (12.5 mg total) into feeding tube daily. Crush med prior to putting in tube 12/02/18 03/02/19  Donzetta Starch, NP  irbesartan (AVAPRO) 300 MG tablet Place 1 tablet (300 mg total) into feeding tube daily. Crush med prior to putting in tube 12/02/18   Donzetta Starch, NP  metFORMIN (GLUCOPHAGE) 850 MG tablet Place 1 tablet (850 mg total) into feeding tube 2 (two) times daily with a meal. Crush med prior to putting in tube 12/02/18   Donzetta Starch, NP  metoprolol tartrate (LOPRESSOR) 50 MG tablet Place 1 tablet (50 mg total) into feeding tube 2 (two) times daily. Crush med prior to putting in tube 12/02/18 03/02/19  Donzetta Starch, NP  Nutritional Supplements (OSMOLITE) LIQD Give 1 can 5 times daily 12/01/18   Rosalin Hawking, MD  pantoprazole sodium (PROTONIX) 40 mg/20 mL PACK Place 20 mLs (40 mg total) into feeding tube at bedtime. 12/02/18   Donzetta Starch, NP  sennosides (SENOKOT) 8.8 MG/5ML syrup Place 5 mLs into feeding tube at bedtime. 12/02/18   Donzetta Starch, NP  simvastatin (ZOCOR) 20 MG tablet Place 1 tablet (20 mg total) into feeding tube at bedtime. Crush med  prior to putting in tube 12/02/18   Donzetta Starch, NP  Water For Irrigation, Sterile (FREE WATER) SOLN Place 150 mLs into feeding tube every 4 (four) hours. 12/01/18   Donzetta Starch, NP    Physical Exam: Vitals:   12/18/18 1604 12/18/18 1615 12/18/18 1800 12/18/18 1845  BP: (!) 100/50 (!) 106/58 (!) 111/47 (!) 118/50  Pulse: (!) 107 (!) 104 (!) 102 (!) 103  Resp: 20 (!) 22 (!) 22 20  Temp:      TempSrc:      SpO2: 99% 100% 99% 99%    Constitutional: Thin frail, cachectic elderly female laying flat in bed Vitals:   12/18/18 1604 12/18/18 1615 12/18/18 1800 12/18/18 1845  BP: (!) 100/50 (!) 106/58 (!) 111/47 (!) 118/50  Pulse: (!) 107 (!) 104 (!) 102 (!) 103  Resp: 20 (!) 22 (!) 22 20  Temp:      TempSrc:      SpO2: 99%  100% 99% 99%   Eyes: PERRL, lids and conjunctivae normal ENMT: Mucous membranes are moist. Posterior pharynx clear of any exudate or lesions. no dentition. Neck: normal, supple, no masses Respiratory: clear to auscultation bilaterally, no wheezing, no crackles. Normal respiratory effort. No accessory muscle use.  On room air. Cardiovascular: Regular rate and rhythm, no murmurs / rubs / gallops. No extremity edema. 2+ pedal pulses but with cold extremities. Abdomen: no tenderness, no masses palpated. No hepatosplenomegaly. Bowel sounds positive.  Musculoskeletal: no clubbing / cyanosis. No joint deformity upper and lower extremities. Good ROM, no contractures. Normal muscle tone.  Skin: no rashes, lesions, ulcers. No induration Neurologic: Alert and oriented only to self and son at bedside.  Able to answer simple questions and follow some simple commands.  Appears to have left-sided hemineglect.  CN 2-12 grossly intact. Sensation intact. Psychiatric:  Alert and oriented to self and son at bedside. Flat affect.     Labs on Admission: I have personally reviewed following labs and imaging studies  CBC: Recent Labs  Lab 12/18/18 1455  WBC 12.2*  NEUTROABS 9.5*    HGB 8.2*  HCT 27.3*  MCV 89.2  PLT 0000000*   Basic Metabolic Panel: Recent Labs  Lab 12/18/18 1432  NA 150*  K 3.8  CL 125*  CO2 17*  GLUCOSE 172*  BUN 89*  CREATININE 1.20*  CALCIUM 7.2*   GFR: CrCl cannot be calculated (Unknown ideal weight.). Liver Function Tests: Recent Labs  Lab 12/18/18 1432  AST 27  ALT 29  ALKPHOS 48  BILITOT 0.5  PROT 5.0*  ALBUMIN 2.0*   No results for input(s): LIPASE, AMYLASE in the last 168 hours. No results for input(s): AMMONIA in the last 168 hours. Coagulation Profile: No results for input(s): INR, PROTIME in the last 168 hours. Cardiac Enzymes: No results for input(s): CKTOTAL, CKMB, CKMBINDEX, TROPONINI in the last 168 hours. BNP (last 3 results) No results for input(s): PROBNP in the last 8760 hours. HbA1C: No results for input(s): HGBA1C in the last 72 hours. CBG: No results for input(s): GLUCAP in the last 168 hours. Lipid Profile: No results for input(s): CHOL, HDL, LDLCALC, TRIG, CHOLHDL, LDLDIRECT in the last 72 hours. Thyroid Function Tests: No results for input(s): TSH, T4TOTAL, FREET4, T3FREE, THYROIDAB in the last 72 hours. Anemia Panel: No results for input(s): VITAMINB12, FOLATE, FERRITIN, TIBC, IRON, RETICCTPCT in the last 72 hours. Urine analysis:    Component Value Date/Time   COLORURINE YELLOW 12/18/2018 1456   APPEARANCEUR CLOUDY (A) 12/18/2018 1456   LABSPEC 1.016 12/18/2018 1456   PHURINE 5.0 12/18/2018 1456   GLUCOSEU NEGATIVE 12/18/2018 1456   HGBUR NEGATIVE 12/18/2018 1456   BILIRUBINUR NEGATIVE 12/18/2018 1456   KETONESUR NEGATIVE 12/18/2018 1456   PROTEINUR NEGATIVE 12/18/2018 1456   UROBILINOGEN 1.0 01/05/2007 1127   NITRITE NEGATIVE 12/18/2018 1456   LEUKOCYTESUR LARGE (A) 12/18/2018 1456    Radiological Exams on Admission: Ct Angio Head W Or Wo Contrast  Result Date: 12/18/2018 CLINICAL DATA:  Altered mental status. Recent large right MCA infarct. EXAM: CT ANGIOGRAPHY HEAD TECHNIQUE:  Multidetector CT imaging of the head was performed using the standard protocol during bolus administration of intravenous contrast. Multiplanar CT image reconstructions and MIPs were obtained to evaluate the vascular anatomy. CONTRAST:  63mL OMNIPAQUE IOHEXOL 350 MG/ML SOLN COMPARISON:  Head CT 11/25/2018, head MRI 11/21/2018, and head CTA 11/20/2018 FINDINGS: CT HEAD Brain: A large right MCA territory infarct demonstrates decreased edema and mass effect. There is no  residual midline shift or residual mass effect on the right lateral ventricle. There is no evidence of significant interval infarct extension. Scattered small regions of gyral hyperattenuation within the infarct are new and may reflect petechial hemorrhage and/or mineralization including a more confluent 2 cm region in the right superior frontal gyrus. No new infarct is identified. Small chronic infarcts are again seen in the left frontal lobe, left occipital lobe, and cerebellum. There is no extra-axial fluid collection. Vascular: Calcified atherosclerosis at the skull base. No suspicious vascular hyperdensity. Skull: No fracture or focal osseous lesion. Sinuses: Visualized paranasal sinuses and mastoid air cells are clear. Orbits: Right cataract extraction. CTA HEAD Anterior circulation: The distal cervical and intracranial portions of the right internal carotid artery are patent with calcified plaque resulting in moderate cavernous and paraclinoid stenoses, unchanged from the recent prior study. A 2 mm aneurysm projects inferiorly from the right cavernous ICA. As seen on the prior study, there is chronic occlusion of the left internal carotid artery in the neck. There is reconstitution of the ICA terminus with patency of the left ACA and MCA origins. The previously revascularized right M1 segment remains patent without significant stenosis. No proximal branch occlusion is evident either. Posterior circulation: The visualized distal left vertebral  artery is patent. There is only at most faint opacification of the right V3 segment which was better opacified on the prior CTA although the vessel was previously shown to be occluded at its origin with distal reconstitution. The right V4 segment is grossly patent though diminutive with severe multifocal narrowing. The left PICA and right AICA appear dominant. The basilar artery is patent with mild diffuse irregularity but no significant stenosis. PCAs are patent with mild irregularity but no significant proximal stenosis. No aneurysm is identified. Venous sinuses: Not well evaluated due to arterial contrast timing. Anatomic variants: None. IMPRESSION: 1. Evolving large right MCA territory infarct with decreased edema and mass effect and no residual midline shift. New small volume petechial hemorrhage and/or mineralization. 2. No new infarct identified. 3. Persistent patency of the right MCA status post endovascular revascularization of an M1 occlusion. 4. 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 5. Unchanged moderate right intracranial ICA stenoses. 6. 2 mm right cavernous ICA aneurysm. Electronically Signed   By: Logan Bores M.D.   On: 12/18/2018 18:20   Dg Chest Portable 1 View  Result Date: 12/18/2018 CLINICAL DATA:  Altered mental status EXAM: PORTABLE CHEST 1 VIEW COMPARISON:  11/25/2018 FINDINGS: Numerous wires and leads overlying the patient's chest. Curvilinear tubing over the left chest. No focal opacity or pleural effusion. Mild cardiomegaly with aortic atherosclerosis. No pneumothorax. IMPRESSION: No active disease.  Mild cardiomegaly. Electronically Signed   By: Donavan Foil M.D.   On: 12/18/2018 15:24    EKG: Independently reviewed.   Assessment/Plan Altered mental status likely secondary to UTI and dehydration -Continue IV Rocephin - received 500cc bolus in ED. Will keep on low 50cc/hr IV fluids for 8 hrs  Acute on chronic anemia -Hemoglobin on admission of 8.2  from a prior of 10.8 two weeks ago -Fecal occult blood positive -She is on both Eliquis and Plavix for atrial fibrillation and embolic stroke - will hold anticoagulation at this time -Iron panel, folate, vitamin B12 -Last endoscopy performed on  9/11 for PEG tube placement had no other findings- suspect likely GI source of bleeding-we will consult GI  AKI - continue IV fluids - check BMP in the morning -Avoid nephrotoxic agent  Recent history of embolic stroke involving the right MCA -Stable.  CTA head on this admission shows no new infarct - continue aspirin  History of atrial fibrillation -Patient currently in normal sinus rhythm - will need to hold Eliquis secondary to new worsening anemia - continue metoprolol  PEG feeding/malnutrition -This was started at last admission secondary to embolic stroke with residual deficit -Consult nutrition -will hold feeding for now secondary to suspected GI bleed  Hypertension -Stable. - Hold ACE/ARBs due to AKI   DVT prophylaxis: None secondary to suspected GI bleed Code Status:DNR Family Communication: Plan discussed with Son at bedside  disposition Plan: Home with at least 2 midnight stays  Consults called: GI Admission status: inpatient   Lynnetta Tom T Latori Beggs DO [facility]   If 7PM-7AM, please contact night-coverage [email or web address] Password [email or web address]  12/18/2018, 7:45 PM

## 2018-12-18 NOTE — Progress Notes (Signed)
   Vital Signs MEWS/VS Documentation      12/18/2018 1845 12/18/2018 1945 12/18/2018 2115 12/18/2018 2147   MEWS Score:  1  2  2  3    MEWS Score Color:  Green  Yellow  Yellow  Yellow   Resp:  20  17  17  18    Pulse:  (!) 103  (!) 113  (!) 115  (!) 115   BP:  (!) 118/50  (!) 129/50  (!) 101/55  (!) 83/52   Temp:  -  -  -  (!) 97.4 F (36.3 C)   O2 Device:  Room [employer]     Frequent VS started.      Tristan Schroeder 12/18/2018,10:15 PM

## 2018-12-18 NOTE — ED Provider Notes (Addendum)
[facility] EMERGENCY DEPARTMENT Provider Note   CSN: [id number] Arrival date & time: 12/18/18  1403     History   Chief Complaint Chief Complaint  Patient presents with   Weakness    HPI [name] is a 78 y.o. female.    HPI Level 5 caveat due to altered mental status. Patient with mental status change.  Just around 1 month ago had a stroke with left-sided deficits.  Got TPA and interventional radiology had a extraction.  Has had left-sided weakness but still had some speech.  However over the last few days has had decreased mental status.  Decreased ability to stand.  She has a PEG tube.  Appears more generally weak also.  Patient's son is here.  States urine may be more concentrated but not smelling different.  No cough.  No fevers.  Reportedly for EMS had episode of SVT that lasted around 30 seconds.  She is on anticoagulation due to atrial fibrillation and strokes.  Patient's son states that she is even doing better now after some of the fluid.  Had initial blood pressure from EMS of 88/40. Past Medical History:  Diagnosis Date   Arthritis    CAD (coronary artery disease)    Carotid artery occlusion    Diabetes mellitus    Hyperlipidemia    Hypertension    Myocardial infarction [facility])    Stroke [facility])     Patient Active Problem List   Diagnosis Date Noted   Urinary tract bacterial infections 12/18/2018   Altered mental status    Acute cystitis without hematuria    G tube feedings (HCC)    Atrial fibrillation with RVR ([place]) 11/28/2018   PSVT (paroxysmal supraventricular tachycardia) ([place]) 11/28/2018   Dysphagia 11/28/2018   Hypertensive urgency 11/28/2018   Hyperlipemia 11/28/2018   Cardiomyopathy ([place]) 11/28/2018   CAD (coronary artery disease) 11/28/2018   Advanced age 71/10/2018   Acute blood loss anemia 11/28/2018   Urinary retention 11/28/2018   Hypokalemia 11/28/2018   Protein-calorie malnutrition, severe  [id number]   Embolic stroke involving right middle cerebral artery (HCC) s/p tPA and IR, d/t new AF 11/20/2018   Middle cerebral artery embolism, right 11/20/2018   Heart murmur 11/20/2018   Bilateral carotid artery stenosis 11/20/2018   Acute respiratory failure with hypoxia (HCC)    Encephalopathy acute    Type 2 diabetes mellitus without complication, without long-term current use of insulin (HCC)    End stage renal disease ([place]) 08/11/2011   Occlusion and stenosis of carotid artery without mention of cerebral infarction 08/11/2011    Past Surgical History:  Procedure Laterality Date   CAROTID ENDARTERECTOMY  01/06/2007   right - with Dacron patch angioplasty   ESOPHAGOGASTRODUODENOSCOPY N/A 12/01/2018   Procedure: ESOPHAGOGASTRODUODENOSCOPY (EGD);  Surgeon: Georganna Skeans, MD;  Location: [facility];  Service: General;  Laterality: N/A;   HERNIA REPAIR  2012   IR CT HEAD LTD  11/20/2018   IR PERCUTANEOUS ART THROMBECTOMY/INFUSION INTRACRANIAL INC DIAG ANGIO  11/20/2018   PEG PLACEMENT N/A 12/01/2018   Procedure: PERCUTANEOUS ENDOSCOPIC GASTROSTOMY (PEG) PLACEMENT;  Surgeon: Georganna Skeans, MD;  Location: [place];  Service: General;  Laterality: N/A;   RADIOLOGY WITH ANESTHESIA N/A 11/20/2018   Procedure: CODE STROKE;  Surgeon: Luanne Bras, MD;  Location: [place];  Service: Radiology;  Laterality: N/A;     OB History   No obstetric history on file.      Home Medications    Prior to Admission medications  Medication Sig Start Date End Date Taking? Authorizing Provider  Amino Acids-Protein Hydrolys (FEEDING SUPPLEMENT, PRO-STAT SUGAR FREE 64,) LIQD Place 30 mLs into feeding tube daily. 12/02/18  Yes Donzetta Starch, NP  apixaban (ELIQUIS) 5 MG TABS tablet Place 1 tablet (5 mg total) into feeding tube every 12 (twelve) hours. Crush med prior to putting in tube and administer promptly 12/01/18  Yes Donzetta Starch, NP  aspirin 81 MG chewable tablet Place 1  tablet (81 mg total) into feeding tube daily. Crush med prior to putting in tube 12/02/18  Yes Biby, Massie Kluver, NP  dicyclomine (BENTYL) 10 MG capsule Place 1 capsule (10 mg total) into feeding tube 4 (four) times daily as needed for spasms. Crush med prior to putting in tube 12/02/18  Yes Biby, Massie Kluver, NP  hydrochlorothiazide (MICROZIDE) 12.5 MG capsule Place 1 capsule (12.5 mg total) into feeding tube daily. Crush med prior to putting in tube 12/02/18 03/02/19 Yes Biby, Massie Kluver, NP  irbesartan (AVAPRO) 300 MG tablet Place 1 tablet (300 mg total) into feeding tube daily. Crush med prior to putting in tube 12/02/18  Yes Biby, Massie Kluver, NP  metFORMIN (GLUCOPHAGE) 850 MG tablet Place 1 tablet (850 mg total) into feeding tube 2 (two) times daily with a meal. Crush med prior to putting in tube 12/02/18  Yes Biby, Massie Kluver, NP  metoprolol tartrate (LOPRESSOR) 50 MG tablet Place 1 tablet (50 mg total) into feeding tube 2 (two) times daily. Crush med prior to putting in tube 12/02/18 03/02/19 Yes Biby, Massie Kluver, NP  Nutritional Supplements (OSMOLITE) LIQD Give 1 can 5 times daily 12/01/18  Yes Rosalin Hawking, MD  pantoprazole sodium (PROTONIX) 40 mg/20 mL PACK Place 20 mLs (40 mg total) into feeding tube at bedtime. 12/02/18  Yes Donzetta Starch, NP  simvastatin (ZOCOR) 20 MG tablet Place 1 tablet (20 mg total) into feeding tube at bedtime. Crush med prior to putting in tube 12/02/18  Yes Biby, Massie Kluver, NP  ENTRESTO 49-51 MG Take 1 tablet by mouth 2 (two) times daily. 12/07/18   [provider]  Water For Irrigation, Sterile (FREE WATER) SOLN Place 150 mLs into feeding tube every 4 (four) hours. 12/01/18   Donzetta Starch, NP    Family History Family History  Problem Relation Age of Onset   Heart disease Mother    Diabetes Mother    Heart disease Brother    Diabetes Brother     Social History Social History   Tobacco Use   Smoking status: Former Smoker    Packs/day: 1.00    Types: Cigarettes     Quit date: 03/23/1987    Years since quitting: 31.7   Smokeless tobacco: Never Used  Substance Use Topics   Alcohol use: No   Drug use: Not on file     Allergies   Patient has no known allergies.   Review of Systems Review of Systems  Unable to perform ROS: Mental status change     Physical Exam Updated Vital Signs BP (!) 123/49    Pulse 99    Temp 98 F (36.7 C) (Oral)    Resp 18    Wt 38.5 kg    SpO2 92%    BMI 16.04 kg/m   Physical Exam Vitals signs and nursing note reviewed.  HENT:     Head: Atraumatic.     Mouth/Throat:     Mouth: Mucous membranes are dry.  Neck:     Musculoskeletal:  Neck supple.  Cardiovascular:     Rate and Rhythm: Regular rhythm.  Abdominal:     Comments: Some fullness right upper quadrant without frank tenderness.  Musculoskeletal:        General: No tenderness.  Skin:    General: Skin is warm.  Neurological:     Comments: Patient is non-to minimally verbal for me.  Asked if she was hurting anywhere she said yes.  However will really not follow commands.  Will really not speak to son either.  Flaccid on left from previous stroke.      ED Treatments / Results  Labs (all labs ordered are listed, but only abnormal results are displayed) Labs Reviewed  COMPREHENSIVE METABOLIC PANEL - Abnormal; Notable for the following components:      Result Value   Sodium 150 (*)    Chloride 125 (*)    CO2 17 (*)    Glucose, Bld 172 (*)    BUN 89 (*)    Creatinine, Ser 1.20 (*)    Calcium 7.2 (*)    Total Protein 5.0 (*)    Albumin 2.0 (*)    GFR calc non Af Amer 44 (*)    GFR calc Af Amer 51 (*)    All other components within normal limits  URINALYSIS, ROUTINE W REFLEX MICROSCOPIC - Abnormal; Notable for the following components:   APPearance CLOUDY (*)    Leukocytes,Ua LARGE (*)    WBC, UA >50 (*)    Bacteria, UA FEW (*)    All other components within normal limits  CBC WITH DIFFERENTIAL/PLATELET - Abnormal; Notable for the following  components:   WBC 12.2 (*)    RBC 3.06 (*)    Hemoglobin 8.2 (*)    HCT 27.3 (*)    RDW 16.2 (*)    Platelets 480 (*)    Neutro Abs 9.5 (*)    All other components within normal limits  CBC - Abnormal; Notable for the following components:   RBC 2.65 (*)    Hemoglobin 7.2 (*)    HCT 23.5 (*)    RDW 16.3 (*)    All other components within normal limits  BASIC METABOLIC PANEL - Abnormal; Notable for the following components:   Sodium 151 (*)    Chloride 122 (*)    CO2 18 (*)    Glucose, Bld 185 (*)    BUN 80 (*)    Creatinine, Ser 1.18 (*)    Calcium 8.5 (*)    GFR calc non Af Amer 45 (*)    GFR calc Af Amer 52 (*)    All other components within normal limits  IRON AND TIBC - Abnormal; Notable for the following components:   Iron 12 (*)    Saturation Ratios 3 (*)    All other components within normal limits  POC OCCULT BLOOD, ED - Abnormal; Notable for the following components:   Fecal Occult Bld POSITIVE (*)    All other components within normal limits  SARS CORONAVIRUS 2 (TAT 6-24 HRS)  URINE CULTURE  FERRITIN  VITAMIN B12  OCCULT BLOOD X 1 CARD TO LAB, STOOL  FOLATE RBC    EKG EKG Interpretation  Date/Time:  Monday December 18 2018 14:13:16 EDT Ventricular Rate:  99 PR Interval:    QRS Duration: 106 QT Interval:  351 QTC Calculation: 451 R Axis:   46 Text Interpretation:  Sinus rhythm LVH with secondary repolarization abnormality Confirmed by Davonna Belling [phone]) on 12/18/2018 3:03:32 PM  Radiology Ct Angio Head W Or Wo Contrast  Result Date: 12/18/2018 CLINICAL DATA:  Altered mental status. Recent large right MCA infarct. EXAM: CT ANGIOGRAPHY HEAD TECHNIQUE: Multidetector CT imaging of the head was performed using the standard protocol during bolus administration of intravenous contrast. Multiplanar CT image reconstructions and MIPs were obtained to evaluate the vascular anatomy. CONTRAST:  49mL OMNIPAQUE IOHEXOL 350 MG/ML SOLN COMPARISON:  Head CT  11/25/2018, head MRI 11/21/2018, and head CTA 11/20/2018 FINDINGS: CT HEAD Brain: A large right MCA territory infarct demonstrates decreased edema and mass effect. There is no residual midline shift or residual mass effect on the right lateral ventricle. There is no evidence of significant interval infarct extension. Scattered small regions of gyral hyperattenuation within the infarct are new and may reflect petechial hemorrhage and/or mineralization including a more confluent 2 cm region in the right superior frontal gyrus. No new infarct is identified. Small chronic infarcts are again seen in the left frontal lobe, left occipital lobe, and cerebellum. There is no extra-axial fluid collection. Vascular: Calcified atherosclerosis at the skull base. No suspicious vascular hyperdensity. Skull: No fracture or focal osseous lesion. Sinuses: Visualized paranasal sinuses and mastoid air cells are clear. Orbits: Right cataract extraction. CTA HEAD Anterior circulation: The distal cervical and intracranial portions of the right internal carotid artery are patent with calcified plaque resulting in moderate cavernous and paraclinoid stenoses, unchanged from the recent prior study. A 2 mm aneurysm projects inferiorly from the right cavernous ICA. As seen on the prior study, there is chronic occlusion of the left internal carotid artery in the neck. There is reconstitution of the ICA terminus with patency of the left ACA and MCA origins. The previously revascularized right M1 segment remains patent without significant stenosis. No proximal branch occlusion is evident either. Posterior circulation: The visualized distal left vertebral artery is patent. There is only at most faint opacification of the right V3 segment which was better opacified on the prior CTA although the vessel was previously shown to be occluded at its origin with distal reconstitution. The right V4 segment is grossly patent though diminutive with severe  multifocal narrowing. The left PICA and right AICA appear dominant. The basilar artery is patent with mild diffuse irregularity but no significant stenosis. PCAs are patent with mild irregularity but no significant proximal stenosis. No aneurysm is identified. Venous sinuses: Not well evaluated due to arterial contrast timing. Anatomic variants: None. IMPRESSION: 1. Evolving large right MCA territory infarct with decreased edema and mass effect and no residual midline shift. New small volume petechial hemorrhage and/or mineralization. 2. No new infarct identified. 3. Persistent patency of the right MCA status post endovascular revascularization of an M1 occlusion. 4. 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 5. Unchanged moderate right intracranial ICA stenoses. 6. 2 mm right cavernous ICA aneurysm. Electronically Signed   By: Logan Bores M.D.   On: 12/18/2018 18:20   Dg Chest Portable 1 View  Result Date: 12/18/2018 CLINICAL DATA:  Altered mental status EXAM: PORTABLE CHEST 1 VIEW COMPARISON:  11/25/2018 FINDINGS: Numerous wires and leads overlying the patient's chest. Curvilinear tubing over the left chest. No focal opacity or pleural effusion. Mild cardiomegaly with aortic atherosclerosis. No pneumothorax. IMPRESSION: No active disease.  Mild cardiomegaly. Electronically Signed   By: Donavan Foil M.D.   On: 12/18/2018 15:24    Procedures Procedures (including critical care time)  Medications Ordered in ED Medications  cefTRIAXone (ROCEPHIN) 1 g in sodium chloride 0.9 %  100 mL IVPB (has no administration in time range)  aspirin chewable tablet 81 mg (has no administration in time range)  simvastatin (ZOCOR) tablet 20 mg (20 mg Per Tube Given 12/19/18 0054)  pantoprazole sodium (PROTONIX) 40 mg/20 mL oral suspension 40 mg (40 mg Per Tube Given 12/19/18 0020)  free water 150 mL (150 mLs Per Tube Given 12/19/18 0418)  metoprolol tartrate (LOPRESSOR) 25 mg/10 mL oral suspension 50  mg (50 mg Per Tube Given 12/19/18 0020)  0.9 %  sodium chloride infusion ( Intravenous Restarted 12/19/18 0020)  chlorhexidine (PERIDEX) 0.12 % solution 15 mL (has no administration in time range)  MEDLINE mouth rinse (has no administration in time range)  iohexol (OMNIPAQUE) 350 MG/ML injection 75 mL (75 mLs Intravenous Contrast Given 12/18/18 1649)  sodium chloride 0.9 % bolus 500 mL (0 mLs Intravenous Stopped 12/18/18 1823)  cefTRIAXone (ROCEPHIN) 1 g in sodium chloride 0.9 % 100 mL IVPB (0 g Intravenous Stopped 12/18/18 1822)     Initial Impression / Assessment and Plan / ED Course  I have reviewed the triage vital signs and the nursing notes.  Pertinent labs & imaging results that were available during my care of the patient were reviewed by me and considered in my medical decision making (see chart for details).  Clinical Course as of Dec 18 1036  Mon Dec 18, 2018  1704 Electrolyte panel notable for elevated BUN and several electrolyte abnormalities including a non-anion gap metabolic acidosis and hypernatremia.   [JK]    Clinical Course User Index [JK] Dorie Rank, MD       Patient with mental status change.  Recent stroke.  Has some hypotension.  Not necessarily infectious yet.  However could easily be dehydration.  Work-up still pending.  Will get CT and CTA of brain.  If mental status improves potentially can be discharged home.  If not potentially could need an MRI.  Care turned over to Dr. Tomi Bamberger.  Final Clinical Impressions(s) / ED Diagnoses   Final diagnoses:  Altered mental status, unspecified altered mental status type  Acute cystitis without hematuria  Anemia, unspecified type  Hypernatremia  Metabolic acidosis  Fecal occult blood test positive    ED Discharge Orders    None       Davonna Belling, MD 12/18/18 1504    Davonna Belling, MD 12/19/18 1038

## 2018-12-18 NOTE — ED Provider Notes (Signed)
Patient initially seen by Dr. Alvino Chapel.  Please see his note.  Patient's urinalysis is consistent with a urinary tract infection.  Several electrolyte abnormalities noted as well as anemia and uremia.    She appears to have an occult GI bleed with worsening anemia and guaiac positive stools.   CT scan does not show an acute infarct.  Patient will be admitted to the hospital for further treatment.  Findings were discussed with the patient and her son.   Dorie Rank, MD 12/18/18 [phone]

## 2018-12-18 NOTE — ED Notes (Signed)
Placed new dressing over stage 2 pressure ulcers on buttocks

## 2018-12-18 NOTE — ED Notes (Signed)
Attempted report x 3. Phone is picked up then hung up.

## 2018-12-18 NOTE — ED Notes (Signed)
Attempted report x 2- phone rings continuously

## 2018-12-18 NOTE — ED Notes (Signed)
Attempted report x 1. On hold for >5 minutes

## 2018-12-18 NOTE — Progress Notes (Signed)
Message sent to provider pertaining to continuous infusion orders.

## 2018-12-18 NOTE — Progress Notes (Signed)
Patient arrived to unit, patient non verbal.  Cardiac monitoring initiated and verified.  Patient presents with a stage 2 pressure injury on sacrum, has PEG tube in place. Ordered fluids aren't running, RN will start.  Will continue to monitor throughout the shift.

## 2018-12-18 NOTE — ED Triage Notes (Signed)
Pt had a stroke on Sept 1 and  left side arm/leg are flaccid from this event. Slurred speech is her new baseline since the stroke. Starting this past Friday, through the weekend, pt started moaning instead of speaking, weakness on right side. Pt has feeding tube. BP 88/40. After 363mL NS, improved 108/56. 100% on room air. HR 97 having a few runs of SVT that last about 30 sec per EMS. Physical Therapy has been working with the pt and she was able to stand on the right leg. But over the weekend, pt was unable to stand.  Pt's sonYomaris Serbus [phone]

## 2018-12-19 DIAGNOSIS — N390 Urinary tract infection, site not specified: Principal | ICD-10-CM

## 2018-12-19 DIAGNOSIS — N19 Unspecified kidney failure: Secondary | ICD-10-CM

## 2018-12-19 DIAGNOSIS — E870 Hyperosmolality and hypernatremia: Secondary | ICD-10-CM

## 2018-12-19 DIAGNOSIS — G9341 Metabolic encephalopathy: Secondary | ICD-10-CM

## 2018-12-19 DIAGNOSIS — A499 Bacterial infection, unspecified: Secondary | ICD-10-CM

## 2018-12-19 DIAGNOSIS — R1319 Other dysphagia: Secondary | ICD-10-CM

## 2018-12-19 DIAGNOSIS — I63419 Cerebral infarction due to embolism of unspecified middle cerebral artery: Secondary | ICD-10-CM

## 2018-12-19 DIAGNOSIS — K922 Gastrointestinal hemorrhage, unspecified: Secondary | ICD-10-CM

## 2018-12-19 DIAGNOSIS — E119 Type 2 diabetes mellitus without complications: Secondary | ICD-10-CM

## 2018-12-19 LAB — FERRITIN: Ferritin: 38 ng/mL (ref 11–307)

## 2018-12-19 LAB — HEMOGLOBIN AND HEMATOCRIT, BLOOD
HCT: 25.5 % — ABNORMAL LOW (ref 36.0–46.0)
Hemoglobin: 7.9 g/dL — ABNORMAL LOW (ref 12.0–15.0)

## 2018-12-19 LAB — BASIC METABOLIC PANEL WITH GFR
Anion gap: 13 (ref 5–15)
BUN: 55 mg/dL — ABNORMAL HIGH (ref 8–23)
CO2: 15 mmol/L — ABNORMAL LOW (ref 22–32)
Calcium: 8.3 mg/dL — ABNORMAL LOW (ref 8.9–10.3)
Chloride: 122 mmol/L — ABNORMAL HIGH (ref 98–111)
Creatinine, Ser: 1.02 mg/dL — ABNORMAL HIGH (ref 0.44–1.00)
GFR calc Af Amer: >60 mL/min
GFR calc non Af Amer: 53 mL/min — ABNORMAL LOW
Glucose, Bld: 184 mg/dL — ABNORMAL HIGH (ref 70–99)
Potassium: 4.1 mmol/L (ref 3.5–5.1)
Sodium: 150 mmol/L — ABNORMAL HIGH (ref 135–145)

## 2018-12-19 LAB — IRON AND TIBC
Iron: 12 ug/dL — ABNORMAL LOW (ref 28–170)
Saturation Ratios: 3 % — ABNORMAL LOW (ref 10.4–31.8)
TIBC: 375 ug/dL (ref 250–450)
UIBC: 363 ug/dL

## 2018-12-19 LAB — GLUCOSE, CAPILLARY
Glucose-Capillary: 114 mg/dL — ABNORMAL HIGH (ref 70–99)
Glucose-Capillary: 154 mg/dL — ABNORMAL HIGH (ref 70–99)

## 2018-12-19 LAB — BASIC METABOLIC PANEL
Anion gap: 11 (ref 5–15)
BUN: 80 mg/dL — ABNORMAL HIGH (ref 8–23)
CO2: 18 mmol/L — ABNORMAL LOW (ref 22–32)
Calcium: 8.5 mg/dL — ABNORMAL LOW (ref 8.9–10.3)
Chloride: 122 mmol/L — ABNORMAL HIGH (ref 98–111)
Creatinine, Ser: 1.18 mg/dL — ABNORMAL HIGH (ref 0.44–1.00)
GFR calc Af Amer: 52 mL/min — ABNORMAL LOW (ref >60)
GFR calc non Af Amer: 45 mL/min — ABNORMAL LOW (ref >60)
Glucose, Bld: 185 mg/dL — ABNORMAL HIGH (ref 70–99)
Potassium: 4.1 mmol/L (ref 3.5–5.1)
Sodium: 151 mmol/L — ABNORMAL HIGH (ref 135–145)

## 2018-12-19 LAB — CBC
HCT: 23.5 % — ABNORMAL LOW (ref 36.0–46.0)
Hemoglobin: 7.2 g/dL — ABNORMAL LOW (ref 12.0–15.0)
MCH: 27.2 pg (ref 26.0–34.0)
MCHC: 30.6 g/dL (ref 30.0–36.0)
MCV: 88.7 fL (ref 80.0–100.0)
Platelets: 400 10*3/uL (ref 150–400)
RBC: 2.65 MIL/uL — ABNORMAL LOW (ref 3.87–5.11)
RDW: 16.3 % — ABNORMAL HIGH (ref 11.5–15.5)
WBC: 10 10*3/uL (ref 4.0–10.5)
nRBC: 0 % (ref 0.0–0.2)

## 2018-12-19 LAB — APTT: aPTT: 24 seconds (ref 24–36)

## 2018-12-19 LAB — VITAMIN B12: Vitamin B-12: 807 pg/mL (ref 180–914)

## 2018-12-19 LAB — HEPARIN LEVEL (UNFRACTIONATED): Heparin Unfractionated: 1.7 [IU]/mL — ABNORMAL HIGH (ref 0.30–0.70)

## 2018-12-19 MED ORDER — DEXTROSE 5 % IV SOLN
INTRAVENOUS | Status: DC
  Administered 2018-12-19 – 2018-12-21 (×3): via INTRAVENOUS

## 2018-12-19 MED ORDER — SODIUM CHLORIDE 0.9 % IV SOLN
8.0000 mg/h | INTRAVENOUS | Status: DC
  Administered 2018-12-19 – 2018-12-21 (×3): 8 mg/h via INTRAVENOUS
  Filled 2018-12-19 (×6): qty 80

## 2018-12-19 MED ORDER — ORAL CARE MOUTH RINSE
15.0000 mL | Freq: Two times a day (BID) | OROMUCOSAL | Status: DC
  Administered 2018-12-19 – 2018-12-26 (×13): 15 mL via OROMUCOSAL

## 2018-12-19 MED ORDER — HEPARIN (PORCINE) 25000 UT/250ML-% IV SOLN
750.0000 [IU]/h | INTRAVENOUS | Status: DC
  Administered 2018-12-19: 450 [IU]/h via INTRAVENOUS
  Filled 2018-12-19: qty 250

## 2018-12-19 MED ORDER — CHLORHEXIDINE GLUCONATE 0.12 % MT SOLN
15.0000 mL | Freq: Two times a day (BID) | OROMUCOSAL | Status: DC
  Administered 2018-12-19 – 2018-12-26 (×15): 15 mL via OROMUCOSAL
  Filled 2018-12-19 (×15): qty 15

## 2018-12-19 MED ORDER — INSULIN GLARGINE 100 UNIT/ML ~~LOC~~ SOLN
6.0000 [IU] | Freq: Every day | SUBCUTANEOUS | Status: DC
  Administered 2018-12-19: 6 [IU] via SUBCUTANEOUS
  Filled 2018-12-19 (×2): qty 0.06

## 2018-12-19 MED ORDER — SODIUM CHLORIDE 0.9 % IV SOLN: INTRAVENOUS | Status: DC

## 2018-12-19 NOTE — Progress Notes (Signed)
Initial Nutrition Assessment  DOCUMENTATION CODES:   Severe malnutrition in context of chronic illness, Underweight  INTERVENTION:   -Once able:  Initiate bolus feedings 1 can (237 ml) Osmolite 1.2 5 times daily  150 ml free water flush QID    Tube feeding regimen provides 1425 kcal (100% of needs), 66 grams of protein, and 1575 ml of H2O.   NUTRITION DIAGNOSIS:   Severe Malnutrition related to chronic illness(CVA) as evidenced by severe fat depletion, severe muscle depletion.  GOAL:   Patient will meet greater than or equal to 90% of their needs  MONITOR:   Labs, Weight trends, TF tolerance, Skin, I & O's  REASON FOR ASSESSMENT:   Consult Enteral/tube feeding initiation and management  ASSESSMENT:   [name] is a 78 y.o. female with medical history significant of CAD, bilateral carotid artery stenosis status post right carotid enterectomy, myocardial infarction, hypertension, mitral valve insufficiency, atrial fibrillation on Eliquis, recent embolic stroke involving the right MCA status post TPA and unsuccessful IR who presented with altered mental status and increasing weakness.  Son at bedside provides history as patient has incoherent speech.  Patient was recently admitted from 8/31 to 9/12 with mechanical ventilation secondary to acute hypoxia respiratory failure with findings of embolic stroke involving the right MCA status post TPA and unsuccessful IR, and ultimately requiring PEG tube placement.  Since returning home, patient has had residual left-sided weakness and slurred speech at baseline.  Son reports that he was still talking to her and getting her up out of bed daily.  However, about 2 days ago she became more nonverbal and was sleeping a lot more and had 2 episodes of diarrhea prompting him to bring her into the emergency department today for further evaluation  Pt admitted with AMS likely secondary to UTI and delirium.   Reviewed I/O's: +1 L x 24  hours  UOP: 125 ml x 24 hours   Case discussed with RN, who reports pt is PEG dependent, however, awaiting GI consult for GIB work-up. TF currently on hold.   Pt non-verbal and unable to provide history. History taken from pt son [name]) at bedside, who is pt's primary caretaker. Pt son reports a general decline in health since stroke earlier this month; prior to this, pt was active, independent, and lived on her own.   Pt son confirmed that pt is NPO, but has been working with home health therapies and has been allowed ice chips with supervision. Pt receives all of her nutrition per PEG and confirmed home TF regimen with son (1 can (237 ml) Osmolite 1.2 5 times per day, 150 ml free water flush 4 times per day, and 20 ml free water flush with medications). Complete home TF regimen provides 1425 kcals, 66 grams protein, and 1575 ml free water daily, meeting 100% of estimated kcal needs.   Pt son unsure about weight loss, but reports that pt has always been a slender and petite woman; pt son shares that the most pt has ever weighed is 115#. Of note, pt son is also tall and of slender stature.   Pt son shares with this RD that he would eventually like pt to transition from TF to oral diet. Discussed rationale for TF dependence and provided encouragement with progress with home health. Discussed possible transition to oral diet and decreasing TF once diet is advanced and pt able to tolerate significant PO's. Pt son anxious to re-start TF ASAP.   Labs reviewed: Na: 151.  NUTRITION - FOCUSED PHYSICAL EXAM:    Most Recent Value  Orbital Region  Severe depletion  Upper Arm Region  Severe depletion  Thoracic and Lumbar Region  Severe depletion  Buccal Region  Severe depletion  Temple Region  Severe depletion  Clavicle Bone Region  Severe depletion  Clavicle and Acromion Bone Region  Severe depletion  Scapular Bone Region  Severe depletion  Dorsal Hand  Severe depletion  Patellar Region  Severe  depletion  Anterior Thigh Region  Severe depletion  Posterior Calf Region  Severe depletion  Edema (RD Assessment)  None  Hair  Reviewed  Eyes  Reviewed  Mouth  Reviewed  Skin  Reviewed  Nails  Reviewed       Diet Order:   Diet Order            Diet NPO time specified  Diet effective now              EDUCATION NEEDS:   No education needs have been identified at this time  Skin:  Skin Assessment: Skin Integrity Issues: Skin Integrity Issues:: Stage II Stage II: sacrum  Last BM:  12/18/18  Height:   Ht Readings from Last 1 Encounters:  11/20/18 5\' 1"  (1.549 m)    Weight:   Wt Readings from Last 1 Encounters:  12/19/18 38.5 kg    Ideal Body Weight:  47.7 kg  BMI:  Body mass index is 16.04 kg/m.  Estimated Nutritional Needs:   Kcal:  1400-1600  Protein:  65-80 grams  Fluid:  > 1.4 L    [name] A. Jimmye Norman, RD, LDN, [place] Registered Dietitian II Certified Diabetes Care and Education Specialist Pager: [phone] After hours Pager: [phone]

## 2018-12-19 NOTE — Progress Notes (Addendum)
PROGRESS NOTE    [name]  [id number]  DOB: [birth year]  DOA: 12/18/2018 PCP: Eloy End, MD  Brief Narrative:  78 y.o. female with medical history significant of CAD, bilateral carotid artery stenosis status post right carotid enterectomy, myocardial infarction, hypertension, mitral valve insufficiency, atrial fibrillation on Eliquis, recent embolic stroke involving the right MCA status post TPA and unsuccessful IR brought in by son in concern for altered mental status and increasing weakness.  Patient was recently admitted from 8/31 to 9/12 with mechanical ventilation secondary to acute hypoxia respiratory failure with findings of embolic stroke involving the right MCA status post TPA and unsuccessful IR, and ultimately requiring PEG tube placement.  Since returning home, patient has had residual left-sided weakness and slurred speech at baseline.  Son reports that he was still talking to her and getting her up out of bed daily.  However, about 2 days ago she became more nonverbal and was sleeping a lot more and had 2 episodes of diarrhea prompting him to bring her in for further evaluation. ED Course:  mildly tachycardic up to 103.CBC shows mildly elevated WBC of 12.2 and a hemoglobin of 8.2 which is decreased from 10.8 two weeks ago.  Fecal occult blood was also positive.  CMP showed elevated sodium of 150 and increase creatinine of 1.20 and elevated BUN of 89.  Urinalysis was positive for large leukocytes and negative nitrites with few bacteria, WBC and hyaline casts.  CTA Angie of the head showed evolving large right MCA territory infarct from prior but no new infarct identified.  Subjective:  Patient seen and examined.  Son at bedside.  Son reports that patient tolerated continuous tube feeds well during prior hospitalization.  She however developed diarrhea with bolus feeds at home.  He states she was progressing well with home physical therapy until past Friday (9/25) when  she started to sleep more and appeared tired/progressively worse in terms of mental status.  He denies noticing any black stools but reports "dark" stools.  Objective: Vitals:   12/19/18 0300 12/19/18 0402 12/19/18 0802 12/19/18 1232  BP:  (!) 91/50 (!) 123/49 (!) 106/52  Pulse:  83 99 71  Resp:  18  18  Temp:  97.6 F (36.4 C) 98 F (36.7 C) (!) 97.5 F (36.4 C)  TempSrc:   Oral Oral  SpO2:  100% 92% 100%  Weight: 38.5 kg       Intake/Output Summary (Last 24 hours) at 12/19/2018 1516 Last data filed at 12/19/2018 0352 Gross per 24 hour  Intake 1155.93 ml  Output -  Net 1155.93 ml   Filed Weights   12/18/18 2147 12/19/18 0300  Weight: 38.3 kg 38.5 kg    Physical Examination:  General exam: Appears pleasantly confused, no acute distress Respiratory system: Clear to auscultation. Respiratory effort normal. Cardiovascular system: S1 & S2 heard. No JVD, murmurs. No pedal edema. Gastrointestinal system: Status post PEG.  Abdomen is nondistended, soft and nontender. No organomegaly or masses felt. Normal bowel sounds heard. Central nervous system: Dysarthria, left dense hemiplegia from recent stroke Skin: No rashes, lesions or ulcers Psychiatry: Judgement and insight appear impaired. Mood & affect appropriate.     Data Reviewed: I have personally reviewed following labs and imaging studies  CBC: Recent Labs  Lab 12/18/18 1455 12/19/18 0520  WBC 12.2* 10.0  NEUTROABS 9.5*  --   HGB 8.2* 7.2*  HCT 27.3* 23.5*  MCV 89.2 88.7  PLT 480* [id number]   Basic Metabolic Panel:  Recent Labs  Lab 12/18/18 1432 12/19/18 0520  NA 150* 151*  K 3.8 4.1  CL 125* 122*  CO2 17* 18*  GLUCOSE 172* 185*  BUN 89* 80*  CREATININE 1.20* 1.18*  CALCIUM 7.2* 8.5*   GFR: Estimated Creatinine Clearance: 24.7 mL/min (A) (by C-G formula based on SCr of 1.18 mg/dL (H)). Liver Function Tests: Recent Labs  Lab 12/18/18 1432  AST 27  ALT 29  ALKPHOS 48  BILITOT 0.5  PROT 5.0*  ALBUMIN  2.0*   No results for input(s): LIPASE, AMYLASE in the last 168 hours. No results for input(s): AMMONIA in the last 168 hours. Coagulation Profile: No results for input(s): INR, PROTIME in the last 168 hours. Cardiac Enzymes: No results for input(s): CKTOTAL, CKMB, CKMBINDEX, TROPONINI in the last 168 hours. BNP (last 3 results) No results for input(s): PROBNP in the last 8760 hours. HbA1C: No results for input(s): HGBA1C in the last 72 hours. CBG: No results for input(s): GLUCAP in the last 168 hours. Lipid Profile: No results for input(s): CHOL, HDL, LDLCALC, TRIG, CHOLHDL, LDLDIRECT in the last 72 hours. Thyroid Function Tests: No results for input(s): TSH, T4TOTAL, FREET4, T3FREE, THYROIDAB in the last 72 hours. Anemia Panel: Recent Labs    12/18/18 2311  VITAMINB12 807  FERRITIN 38  TIBC 375  IRON 12*   Sepsis Labs: No results for input(s): PROCALCITON, LATICACIDVEN in the last 168 hours.  Recent Results (from the past 240 hour(s))  SARS CORONAVIRUS 2 (TAT 6-24 HRS) Nasopharyngeal Nasopharyngeal Swab     Status: None   Collection Time: 12/18/18  2:42 PM   Specimen: Nasopharyngeal Swab  Result Value Ref Range Status   SARS Coronavirus 2 NEGATIVE NEGATIVE Final    Comment: (NOTE) SARS-CoV-2 target nucleic acids are NOT DETECTED. The SARS-CoV-2 RNA is generally detectable in upper and lower respiratory specimens during the acute phase of infection. Negative results do not preclude SARS-CoV-2 infection, do not rule out co-infections with other pathogens, and should not be used as the sole basis for treatment or other patient management decisions. Negative results must be combined with clinical observations, patient history, and epidemiological information. The expected result is Negative. Fact Sheet for Patients: SugarRoll.be Fact Sheet for Healthcare Providers: https://www.woods-mathews.com/ This test is not yet approved or  cleared by the [place] FDA and  has been authorized for detection and/or diagnosis of SARS-CoV-2 by FDA under an Emergency Use Authorization (EUA). This EUA will remain  in effect (meaning this test can be used) for the duration of the COVID-19 declaration under Section 56 4(b)(1) of the Act, 21 U.S.C. section 360bbb-3(b)(1), unless the authorization is terminated or revoked sooner. Performed at [facility], [place] [address] [place], [place] [address]   Urine Culture     Status: None (Preliminary result)   Collection Time: 12/18/18  2:56 PM   Specimen: Urine, Catheterized  Result Value Ref Range Status   Specimen Description URINE, CATHETERIZED  Final   Special Requests NONE  Final   Culture   Final    CULTURE REINCUBATED FOR BETTER GROWTH Performed at [facility], [place] [address] [place], [place] [address]    Report Status PENDING  Incomplete      Radiology Studies: Ct Angio Head W Or Wo Contrast  Result Date: 12/18/2018 CLINICAL DATA:  Altered mental status. Recent large right MCA infarct. EXAM: CT ANGIOGRAPHY HEAD TECHNIQUE: Multidetector CT imaging of the head was performed using the standard protocol during bolus administration  of intravenous contrast. Multiplanar CT image reconstructions and MIPs were obtained to evaluate the vascular anatomy. CONTRAST:  26mL OMNIPAQUE IOHEXOL 350 MG/ML SOLN COMPARISON:  Head CT 11/25/2018, head MRI 11/21/2018, and head CTA 11/20/2018 FINDINGS: CT HEAD Brain: A large right MCA territory infarct demonstrates decreased edema and mass effect. There is no residual midline shift or residual mass effect on the right lateral ventricle. There is no evidence of significant interval infarct extension. Scattered small regions of gyral hyperattenuation within the infarct are new and may reflect petechial hemorrhage and/or mineralization including a more confluent 2 cm region in the right superior frontal gyrus. No new infarct is identified.  Small chronic infarcts are again seen in the left frontal lobe, left occipital lobe, and cerebellum. There is no extra-axial fluid collection. Vascular: Calcified atherosclerosis at the skull base. No suspicious vascular hyperdensity. Skull: No fracture or focal osseous lesion. Sinuses: Visualized paranasal sinuses and mastoid air cells are clear. Orbits: Right cataract extraction. CTA HEAD Anterior circulation: The distal cervical and intracranial portions of the right internal carotid artery are patent with calcified plaque resulting in moderate cavernous and paraclinoid stenoses, unchanged from the recent prior study. A 2 mm aneurysm projects inferiorly from the right cavernous ICA. As seen on the prior study, there is chronic occlusion of the left internal carotid artery in the neck. There is reconstitution of the ICA terminus with patency of the left ACA and MCA origins. The previously revascularized right M1 segment remains patent without significant stenosis. No proximal branch occlusion is evident either. Posterior circulation: The visualized distal left vertebral artery is patent. There is only at most faint opacification of the right V3 segment which was better opacified on the prior CTA although the vessel was previously shown to be occluded at its origin with distal reconstitution. The right V4 segment is grossly patent though diminutive with severe multifocal narrowing. The left PICA and right AICA appear dominant. The basilar artery is patent with mild diffuse irregularity but no significant stenosis. PCAs are patent with mild irregularity but no significant proximal stenosis. No aneurysm is identified. Venous sinuses: Not well evaluated due to arterial contrast timing. Anatomic variants: None. IMPRESSION: 1. Evolving large right MCA territory infarct with decreased edema and mass effect and no residual midline shift. New small volume petechial hemorrhage and/or mineralization. 2. No new infarct  identified. 3. Persistent patency of the right MCA status post endovascular revascularization of an M1 occlusion. 4. 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 5. Unchanged moderate right intracranial ICA stenoses. 6. 2 mm right cavernous ICA aneurysm. Electronically Signed   By: Logan Bores M.D.   On: 12/18/2018 18:20   Dg Chest Portable 1 View  Result Date: 12/18/2018 CLINICAL DATA:  Altered mental status EXAM: PORTABLE CHEST 1 VIEW COMPARISON:  11/25/2018 FINDINGS: Numerous wires and leads overlying the patient's chest. Curvilinear tubing over the left chest. No focal opacity or pleural effusion. Mild cardiomegaly with aortic atherosclerosis. No pneumothorax. IMPRESSION: No active disease.  Mild cardiomegaly. Electronically Signed   By: Donavan Foil M.D.   On: 12/18/2018 15:24        Scheduled Meds: . aspirin  81 mg Per Tube Daily  . chlorhexidine  15 mL Mouth Rinse BID  . free water  150 mL Per Tube Q4H  . mouth rinse  15 mL Mouth Rinse q12n4p  . metoprolol tartrate  50 mg Per Tube BID  . pantoprazole sodium  40 mg Per Tube QHS  .  simvastatin  20 mg Per Tube QHS   Continuous Infusions: . sodium chloride 50 mL/hr at 12/19/18 0020  . cefTRIAXone (ROCEPHIN)  IV      Assessment & Plan:  1.  Metabolic encephalopathy: Present on admission.  Likely secondary to hypernatremia, dehydration, uremia and possible UTI in the setting of recent stroke.  Appears to be improving slowly.  Continue to monitor closely.  2.  Hypernatremia: In the setting of diarrhea/dehydration.  Could be related to recent stroke as well.  Son states he was administering 150 cc of free water via tube feed 4 times a day at home.  Currently on 150 cc free water every 4 hours.  Will increase to 200 cc and change IV fluids from normal saline to hypotonic fluids with D5W (will need to be cautious given history of diabetes).  Blood glucose levels escalate, can change to half-normal saline.  Will check BMP  every 6 hours and endorse to on-call for follow-up  3.  AKI/uremia: Continue IV hydration as well as via PEG tube and continue to hold ACE inhibitors.  Disproportionate elevation in BUN could be secondary to GI bleed as well.  4.  Acute on chronic anemia: Fecal occult blood test positive on presentation.  Hemoglobin dropped to 8.2 from last discharge level of 10.8.  Could be secondary to upper GI bleed from PEG site mucosal injury, especially in the setting of multiple antiplatelets/anticoagulants as discussed with GI Dr. Paulita Fujita who will evaluate patient in a.m.  Unlikely to benefit from another EGD per GI as recent EGD during PEG placement reported normal esophagus/stomach and also given the risks in the setting of recent stroke.  Will start on Protonix drip and monitor closely on IV heparin.  Check H&H every 6 hours.   5.  Recent embolic CVA: Patient was discharged on Eliquis in addition to aspirin and cilostazol that she was taking.  Could have contributed to problem #4.  Currently resumed on aspirin and statins.  Eliquis held on admission and concern for GI bleed.  Discussed with son regarding benefits and risks of continuing and holding anticoagulation respectively.  He agreed with resuming heparin and monitoring closely as there is no evidence of gross GI bleeding.   6.  Paroxysmal atrial fibrillation: Now in normal sinus rhythm.  Eliquis held on admission.  Continue metoprolol.  Heparin without bolus to be initiated per pharmacy consult  7. UTI: Patient had abnormal UA with pyuria on presentation.  On IV Rocephin.  Leukocytosis now resolved.  Will follow-up urine cultures  8.  Dysphagia/dysarthria: In the setting of recent stroke.  Status post PEG tube in previous admission.  Will need speech/nutrition evaluation to advise on optimal diet as per son patient was not tolerating bolus feeds well.  Will resume tube feeds if hemoglobin remains stable and no signs of GI bleed on heparin  9.   PAD/carotid artery stenosis: Status post carotid endarterectomy.  On aspirin/cilostazol/statins at baseline  10.  Diabetes mellitus: Patient on metformin at home.  Currently on dextrose fluid and concern for hypernatremia.  Watch blood glucose closely, continue sliding scale insulin.  Added Lantus for additional coverage while on dextrose fluids.   DVT prophylaxis: SCDs Code Status: DNR Family / Patient Communication: Discussed with son bedside Disposition Plan: TBD.  Son previously declined SNF rehab and took her home     LOS: 1 day    Time spent: 35 minutes    Guilford Shi, MD [facility] Pager [phone]  If 7PM-7AM,  please contact night-coverage [email or web address] Password [facility] 12/19/2018, 3:16 PM

## 2018-12-19 NOTE — Progress Notes (Addendum)
ANTICOAGULATION CONSULT NOTE - Initial Consult  Pharmacy Consult for Heparin Indication: Recent Embolic Stroke  No Known Allergies  Patient Measurements: Weight: 84 lb 14.4 oz (38.5 kg) Heparin Dosing Weight Total Body Weight = 38.5 kg  Vital Signs: Temp: 98 F (36.7 C) (09/29 1637) Temp Source: Oral (09/29 1637) BP: 96/53 (09/29 1637) Pulse Rate: 90 (09/29 1637)  Labs: Recent Labs    12/18/18 1432 12/18/18 1455 12/19/18 0520  HGB  --  8.2* 7.2*  HCT  --  27.3* 23.5*  PLT  --  480* 400  CREATININE 1.20*  --  1.18*    Estimated Creatinine Clearance: 24.7 mL/min (A) (by C-G formula based on SCr of 1.18 mg/dL (H)).   Medical History: Past Medical History:  Diagnosis Date  . Arthritis   . CAD (coronary artery disease)   . Carotid artery occlusion   . Diabetes mellitus   . Hyperlipidemia   . Hypertension   . Myocardial infarction ([place])   . Stroke [facility])     Assessment: 78 yr old female admitted on 12/18/18 for altered mental status and weakness. Pt had recent embolic stroke involving right MCA, S/P TPA. Pt has hx of a fib and was taking apixaban 5 mg po BID PTA (last dose was at 0700 on 9/28, per med rec), along with Plavix and ASA. Scr up to 1.18 from baseline of 0.9-1.0); CrCl ~25 ml/min. Fecal occult blood was positive in ED, and H/H in ED decreased from 2 wks ago, so anticoagulation initially held during this admission. Pharmacy now consulted to dose IV heparin (no bolus) in this pt with recent embolic stroke who was on apixaban PTA.  Goal of Therapy:  Heparin level 0.3-0.5 units/ml aPTT 66-90 seconds Monitor platelets by anticoagulation protocol: Yes   Plan:  Heparin infusion at 450 units/hr (no bolus) Check aPTT and heparin level prior to starting infusion Check 8-hr aPTT and heparin level, then daily Monitor CBC and signs/symptoms of bleeding  Gillermina Hu, PharmD, BCPS, [facility] Clinical Pharmacist 12/19/2018,4:51 PM   ADDENDUM: Baseline heparin level  (prior to starting heparin infusion) was 1.7 units/ml, indicating effect of apixaban. aPTT drawn at the same time was 24 sec. Will monitor heparin anticoagulation by aPTT until aPTT and heparin levels are in sync.

## 2018-12-19 NOTE — Plan of Care (Signed)
  Problem: Education: Goal: Knowledge of General Education information will improve Description Including pain rating scale, medication(s)/side effects and non-pharmacologic comfort measures Outcome: Progressing   Problem: Health Behavior/Discharge Planning: Goal: Ability to manage health-related needs will improve Outcome: Progressing   

## 2018-12-20 DIAGNOSIS — D649 Anemia, unspecified: Secondary | ICD-10-CM

## 2018-12-20 DIAGNOSIS — R195 Other fecal abnormalities: Secondary | ICD-10-CM

## 2018-12-20 DIAGNOSIS — L8990 Pressure ulcer of unspecified site, unspecified stage: Secondary | ICD-10-CM | POA: Insufficient documentation

## 2018-12-20 DIAGNOSIS — L8942 Pressure ulcer of contiguous site of back, buttock and hip, stage 2: Secondary | ICD-10-CM

## 2018-12-20 LAB — BASIC METABOLIC PANEL
Anion gap: 8 (ref 5–15)
Anion gap: 10 (ref 5–15)
Anion gap: 8 (ref 5–15)
Anion gap: 11 (ref 5–15)
BUN: 47 mg/dL — ABNORMAL HIGH (ref 8–23)
BUN: 46 mg/dL — ABNORMAL HIGH (ref 8–23)
BUN: 33 mg/dL — ABNORMAL HIGH (ref 8–23)
BUN: 28 mg/dL — ABNORMAL HIGH (ref 8–23)
CO2: 18 mmol/L — ABNORMAL LOW (ref 22–32)
CO2: 16 mmol/L — ABNORMAL LOW (ref 22–32)
CO2: 21 mmol/L — ABNORMAL LOW (ref 22–32)
CO2: 17 mmol/L — ABNORMAL LOW (ref 22–32)
Calcium: 8.2 mg/dL — ABNORMAL LOW (ref 8.9–10.3)
Calcium: 8.1 mg/dL — ABNORMAL LOW (ref 8.9–10.3)
Calcium: 8.6 mg/dL — ABNORMAL LOW (ref 8.9–10.3)
Calcium: 7.9 mg/dL — ABNORMAL LOW (ref 8.9–10.3)
Chloride: 122 mmol/L — ABNORMAL HIGH (ref 98–111)
Chloride: 121 mmol/L — ABNORMAL HIGH (ref 98–111)
Chloride: 119 mmol/L — ABNORMAL HIGH (ref 98–111)
Chloride: 114 mmol/L — ABNORMAL HIGH (ref 98–111)
Creatinine, Ser: 0.98 mg/dL (ref 0.44–1.00)
Creatinine, Ser: 0.96 mg/dL (ref 0.44–1.00)
Creatinine, Ser: 0.92 mg/dL (ref 0.44–1.00)
Creatinine, Ser: 0.97 mg/dL (ref 0.44–1.00)
GFR calc Af Amer: >60 mL/min (ref >60)
GFR calc Af Amer: >60 mL/min (ref >60)
GFR calc Af Amer: >60 mL/min (ref >60)
GFR calc Af Amer: >60 mL/min (ref >60)
GFR calc non Af Amer: 56 mL/min — ABNORMAL LOW (ref >60)
GFR calc non Af Amer: 57 mL/min — ABNORMAL LOW (ref >60)
GFR calc non Af Amer: >60 mL/min (ref >60)
GFR calc non Af Amer: 57 mL/min — ABNORMAL LOW (ref >60)
Glucose, Bld: 194 mg/dL — ABNORMAL HIGH (ref 70–99)
Glucose, Bld: 162 mg/dL — ABNORMAL HIGH (ref 70–99)
Glucose, Bld: 178 mg/dL — ABNORMAL HIGH (ref 70–99)
Glucose, Bld: 182 mg/dL — ABNORMAL HIGH (ref 70–99)
Potassium: 3.6 mmol/L (ref 3.5–5.1)
Potassium: 3.7 mmol/L (ref 3.5–5.1)
Potassium: 3.8 mmol/L (ref 3.5–5.1)
Potassium: 3.1 mmol/L — ABNORMAL LOW (ref 3.5–5.1)
Sodium: 148 mmol/L — ABNORMAL HIGH (ref 135–145)
Sodium: 147 mmol/L — ABNORMAL HIGH (ref 135–145)
Sodium: 148 mmol/L — ABNORMAL HIGH (ref 135–145)
Sodium: 142 mmol/L (ref 135–145)

## 2018-12-20 LAB — HEMOGLOBIN AND HEMATOCRIT, BLOOD
HCT: 24.3 % — ABNORMAL LOW (ref 36.0–46.0)
HCT: 26.9 % — ABNORMAL LOW (ref 36.0–46.0)
HCT: 22.1 % — ABNORMAL LOW (ref 36.0–46.0)
Hemoglobin: 7.3 g/dL — ABNORMAL LOW (ref 12.0–15.0)
Hemoglobin: 7.8 g/dL — ABNORMAL LOW (ref 12.0–15.0)
Hemoglobin: 6.7 g/dL — CL (ref 12.0–15.0)

## 2018-12-20 LAB — GLUCOSE, CAPILLARY
Glucose-Capillary: 140 mg/dL — ABNORMAL HIGH (ref 70–99)
Glucose-Capillary: 128 mg/dL — ABNORMAL HIGH (ref 70–99)
Glucose-Capillary: 135 mg/dL — ABNORMAL HIGH (ref 70–99)

## 2018-12-20 LAB — CBC
HCT: 23.2 % — ABNORMAL LOW (ref 36.0–46.0)
Hemoglobin: 7.3 g/dL — ABNORMAL LOW (ref 12.0–15.0)
MCH: 26.9 pg (ref 26.0–34.0)
MCHC: 31.5 g/dL (ref 30.0–36.0)
MCV: 85.6 fL (ref 80.0–100.0)
Platelets: 356 10*3/uL (ref 150–400)
RBC: 2.71 MIL/uL — ABNORMAL LOW (ref 3.87–5.11)
RDW: 16.1 % — ABNORMAL HIGH (ref 11.5–15.5)
WBC: 8.4 10*3/uL (ref 4.0–10.5)
nRBC: 0.5 % — ABNORMAL HIGH (ref 0.0–0.2)

## 2018-12-20 LAB — APTT
aPTT: 33 seconds (ref 24–36)
aPTT: 42 seconds — ABNORMAL HIGH (ref 24–36)
aPTT: 40 seconds — ABNORMAL HIGH (ref 24–36)

## 2018-12-20 LAB — FOLATE RBC
Folate, Hemolysate: 285 ng/mL
Folate, RBC: 1067 ng/mL (ref >498)
Hematocrit: 26.7 % — ABNORMAL LOW (ref 34.0–46.6)

## 2018-12-20 LAB — HEPARIN LEVEL (UNFRACTIONATED): Heparin Unfractionated: 1.02 IU/mL — ABNORMAL HIGH (ref 0.30–0.70)

## 2018-12-20 MED ORDER — INSULIN ASPART 100 UNIT/ML ~~LOC~~ SOLN: 0.0000 [IU] | Freq: Three times a day (TID) | SUBCUTANEOUS | Status: DC

## 2018-12-20 MED ORDER — POTASSIUM CHLORIDE 10 MEQ/100ML IV SOLN
10.0000 meq | INTRAVENOUS | Status: AC
  Administered 2018-12-21 (×3): 10 meq via INTRAVENOUS
  Filled 2018-12-20: qty 100

## 2018-12-20 MED ORDER — INSULIN GLARGINE 100 UNIT/ML ~~LOC~~ SOLN
8.0000 [IU] | Freq: Every day | SUBCUTANEOUS | Status: DC
  Administered 2018-12-21: 01:00:00 8 [IU] via SUBCUTANEOUS
  Filled 2018-12-20 (×3): qty 0.08

## 2018-12-20 MED ORDER — OSMOLITE 1.2 CAL PO LIQD
1000.0000 mL | ORAL | Status: DC
  Administered 2018-12-21: 1000 mL
  Filled 2018-12-20 (×3): qty 1000

## 2018-12-20 MED ORDER — INSULIN ASPART 100 UNIT/ML ~~LOC~~ SOLN: 0.0000 [IU] | Freq: Every day | SUBCUTANEOUS | Status: DC

## 2018-12-20 MED ORDER — OSMOLITE 1.2 CAL PO LIQD
1000.0000 mL | ORAL | Status: DC
  Filled 2018-12-20: qty 1000

## 2018-12-20 MED ORDER — FREE WATER
200.0000 mL | Status: DC
  Administered 2018-12-20 – 2018-12-26 (×35): 200 mL

## 2018-12-20 MED ORDER — SODIUM CHLORIDE 0.9 % IV SOLN
1.5000 g | Freq: Four times a day (QID) | INTRAVENOUS | Status: DC
  Administered 2018-12-20 – 2018-12-26 (×21): 1.5 g via INTRAVENOUS
  Filled 2018-12-20 (×5): qty 4
  Filled 2018-12-20: qty 1.5
  Filled 2018-12-20 (×22): qty 4
  Filled 2018-12-20: qty 1.5
  Filled 2018-12-20: qty 4

## 2018-12-20 MED ORDER — OSMOLITE 1.2 CAL PO LIQD: 237.0000 mL | Freq: Every day | ORAL | Status: DC

## 2018-12-20 NOTE — Progress Notes (Addendum)
Nutrition Follow-up  DOCUMENTATION CODES:   Severe malnutrition in context of chronic illness, Underweight  INTERVENTION:   Initiate Osmolite 1.2 @ 20 ml/hr via PEG for today per MD  Tube feeding regimen provides 576 kcal (41% of needs), 27 grams of protein, and 394 ml of H2O.   NUTRITION DIAGNOSIS:   Severe Malnutrition related to chronic illness(CVA) as evidenced by severe fat depletion, severe muscle depletion.  Ongoing  GOAL:   Patient will meet greater than or equal to 90% of their needs  Unmet  MONITOR:   Labs, Weight trends, TF tolerance, Skin, I & O's  REASON FOR ASSESSMENT:   Consult Enteral/tube feeding initiation and management  ASSESSMENT:   [name] is a 78 y.o. female with medical history significant of CAD, bilateral carotid artery stenosis status post right carotid enterectomy, myocardial infarction, hypertension, mitral valve insufficiency, atrial fibrillation on Eliquis, recent embolic stroke involving the right MCA status post TPA and unsuccessful IR who presented with altered mental status and increasing weakness.  Son at bedside provides history as patient has incoherent speech.  Patient was recently admitted from 8/31 to 9/12 with mechanical ventilation secondary to acute hypoxia respiratory failure with findings of embolic stroke involving the right MCA status post TPA and unsuccessful IR, and ultimately requiring PEG tube placement.  Since returning home, patient has had residual left-sided weakness and slurred speech at baseline.  Son reports that he was still talking to her and getting her up out of bed daily.  However, about 2 days ago she became more nonverbal and was sleeping a lot more and had 2 episodes of diarrhea prompting him to bring her into the emergency department today for further evaluation  Reviewed I/O's: +809 ml x 24 hours and +1.8 L since admission  Case discussed with RN, who reports PEG tube was flush with 50 ml water per GI  MD order. Tube flushed well without any blood noted.   Per hospitalist note, plan to resume TF today at 20 ml/hr.   Medications reviewed and include dextrose 5% solution @ 50 ml/hr.   Labs reviewed: Na: 147, CBGS: 154(inpatient orders for glycemic control are 6 units insulin glargine daily).   Diet Order:   Diet Order            Diet NPO time specified  Diet effective now              EDUCATION NEEDS:   No education needs have been identified at this time  Skin:  Skin Assessment: Skin Integrity Issues: Skin Integrity Issues:: Stage II Stage II: sacrum  Last BM:  12/18/18  Height:   Ht Readings from Last 1 Encounters:  11/20/18 5\' 1"  (1.549 m)    Weight:   Wt Readings from Last 1 Encounters:  12/20/18 39.2 kg    Ideal Body Weight:  47.7 kg  BMI:  Body mass index is 16.34 kg/m.  Estimated Nutritional Needs:   Kcal:  1400-1600  Protein:  65-80 grams  Fluid:  > 1.4 L    Virlan Kempker A. Jimmye Norman, RD, LDN, [place] Registered Dietitian II Certified Diabetes Care and Education Specialist Pager: [phone] After hours Pager: [phone]

## 2018-12-20 NOTE — Progress Notes (Signed)
ANTICOAGULATION CONSULT NOTE   Pharmacy Consult for Heparin Indication: Recent Embolic Stroke, afib  No Known Allergies  Patient Measurements: Weight: 86 lb 8 oz (39.2 kg) Heparin Dosing Weight Total Body Weight = 38.5 kg  Vital Signs: Temp: 97.7 F (36.5 C) (09/30 2005) Temp Source: Oral (09/30 2005) BP: 122/56 (09/30 2005) Pulse Rate: 96 (09/30 2008)  Labs: Recent Labs    12/18/18 1455  12/19/18 0520  12/19/18 1841  12/20/18 0254 12/20/18 0525 12/20/18 1351 12/20/18 2122  HGB 8.2*  --  7.2*  --   --    < > 7.3* 7.3* 7.8* 6.7*  HCT 27.3*   < > 23.5*  --   --    < > 23.2* 24.3* 26.9* 22.1*  PLT 480*  --  400  --   --   --  356  --   --   --   APTT  --   --   --    < > 24  --  33  --  42* 40*  HEPARINUNFRC  --   --   --   --  1.70*  --   --  1.02*  --   --   CREATININE  --   --  1.18*  --   --    < > 0.98 0.96 0.92 0.97   < > = values in this interval not displayed.    Estimated Creatinine Clearance: 30.5 mL/min (by C-G formula based on SCr of 0.97 mg/dL).  Assessment: 78 yr old female admitted on 12/18/18 for altered mental status and weakness. Pt had recent embolic stroke involving right MCA, S/P TPA. Pt has hx of a fib and was taking apixaban 5 mg po BID PTA (last dose was at 0700 on 9/28, per med rec), along with Plavix and ASA   Fecal occult blood was positive in ED, and H/H in ED decreased from 2 wks ago, so anticoagulation initially held during this admission. Pharmacy now consulted to dose IV heparin (no bolus) in this pt with recent embolic stroke who was on apixaban PTA  APTT low this evening at 40 Hgb also dropped to 6.7 - alerted Baltazar Najjar NP TR[facility]  Goal of Therapy:  Heparin level 0.3-0.5 units/ml aPTT 66-84 seconds Monitor platelets by anticoagulation protocol: Yes   Plan:  Slowly increase heparin to 750 units/hr Daily aPTT HL CBC  Levester Fresh, PharmD, BCPS, BCCCP Clinical Pharmacist [phone]  Please check AMION for all [place]  numbers  12/20/2018 10:17 PM

## 2018-12-20 NOTE — Progress Notes (Signed)
Spoke to GI MD-  He wanted [place] to Flush peg tube with 70ml of flush and aspirate to check for blood.     Flushed well without any resistance and aspirated clear fluid content (no blood seen). Patient tolerated well.  During procedure- patient's head of bed sat upright 30 degrees.

## 2018-12-20 NOTE — Progress Notes (Signed)
ANTICOAGULATION CONSULT NOTE   Pharmacy Consult for Heparin Indication: Recent Embolic Stroke, afib  No Known Allergies  Patient Measurements: Weight: 86 lb 8 oz (39.2 kg) Heparin Dosing Weight Total Body Weight = 38.5 kg  Vital Signs: Temp: 98.4 F (36.9 C) (09/30 0425) Temp Source: Oral (09/30 0425) BP: 107/38 (09/30 0425) Pulse Rate: 73 (09/30 0425)  Labs: Recent Labs    12/18/18 1455 12/19/18 0520 12/19/18 1841 12/19/18 2108 12/20/18 0254 12/20/18 0525  HGB 8.2* 7.2*  --  7.9* 7.3* 7.3*  HCT 27.3* 23.5*  --  25.5* 23.2* 24.3*  PLT 480* 400  --   --  356  --   APTT  --   --  24  --  33  --   HEPARINUNFRC  --   --  1.70*  --   --  1.02*  CREATININE  --  1.18*  --  1.02* 0.98 0.96    Estimated Creatinine Clearance: 30.9 mL/min (by C-G formula based on SCr of 0.96 mg/dL).   Medical History: Past Medical History:  Diagnosis Date  . Arthritis   . CAD (coronary artery disease)   . Carotid artery occlusion   . Diabetes mellitus   . Hyperlipidemia   . Hypertension   . Myocardial infarction ([place])   . Stroke [facility])     Assessment: 78 yr old female admitted on 12/18/18 for altered mental status and weakness. Pt had recent embolic stroke involving right MCA, S/P TPA. Pt has hx of a fib and was taking apixaban 5 mg po BID PTA (last dose was at 0700 on 9/28, per med rec), along with Plavix and ASA. Scr up to 1.18 from baseline of 0.9-1.0); CrCl ~25 ml/min. Fecal occult blood was positive in ED, and H/H in ED decreased from 2 wks ago, so anticoagulation initially held during this admission. Pharmacy now consulted to dose IV heparin (no bolus) in this pt with recent embolic stroke who was on apixaban PTA.  9/30 AM update:  APTT is sub-therapeutic, no issues per RN  Goal of Therapy:  Heparin level 0.3-0.5 units/ml aPTT 66-84 seconds Monitor platelets by anticoagulation protocol: Yes   Plan:  Inc heparin to 550 units/hr Re-check aPTT at [place], PharmD,  [place] Pharmacist Phone: [phone]

## 2018-12-20 NOTE — Consult Note (Signed)
Referring Provider: Dr. Earnest Conroy Primary Care Physician:  Eloy End, MD Primary Gastroenterologist:  UNASSIGNED  Reason for Consultation:  Anemia; Heme positive stool  HPI: [name] is a 78 y.o. female with a recent stroke on Eliquis as an outpt who was readmitted yesterday due to altered mental status. Hgb drop from 10.8 on discharge 2 weeks ago to 8.2 yesterday. S/P PEG placement by Dr. Grandville Silos on 12/01/18 and tube has reportedly been working fine with her son saying she receives tube feeds 5 times per day at home. Normal EGD at the time of her PEG tube placement. Heme positive. No report of melena or hematochezia. Patient is non-verbal.   Past Medical History:  Diagnosis Date  . Arthritis   . CAD (coronary artery disease)   . Carotid artery occlusion   . Diabetes mellitus   . Hyperlipidemia   . Hypertension   . Myocardial infarction ([place])   . Stroke [facility])     Past Surgical History:  Procedure Laterality Date  . CAROTID ENDARTERECTOMY  01/06/2007   right - with Dacron patch angioplasty  . ESOPHAGOGASTRODUODENOSCOPY N/A 12/01/2018   Procedure: ESOPHAGOGASTRODUODENOSCOPY (EGD);  Surgeon: Georganna Skeans, MD;  Location: [facility] ENDOSCOPY;  Service: General;  Laterality: N/A;  . HERNIA REPAIR  2012  . IR CT HEAD LTD  11/20/2018  . IR PERCUTANEOUS ART THROMBECTOMY/INFUSION INTRACRANIAL INC DIAG ANGIO  11/20/2018  . PEG PLACEMENT N/A 12/01/2018   Procedure: PERCUTANEOUS ENDOSCOPIC GASTROSTOMY (PEG) PLACEMENT;  Surgeon: Georganna Skeans, MD;  Location: [place];  Service: General;  Laterality: N/A;  . RADIOLOGY WITH ANESTHESIA N/A 11/20/2018   Procedure: CODE STROKE;  Surgeon: Luanne Bras, MD;  Location: [place];  Service: Radiology;  Laterality: N/A;    Prior to Admission medications   Medication Sig Start Date End Date Taking? Authorizing Provider  Amino Acids-Protein Hydrolys (FEEDING SUPPLEMENT, PRO-STAT SUGAR FREE 64,) LIQD Place 30 mLs into feeding tube daily.  12/02/18  Yes Donzetta Starch, NP  apixaban (ELIQUIS) 5 MG TABS tablet Place 1 tablet (5 mg total) into feeding tube every 12 (twelve) hours. Crush med prior to putting in tube and administer promptly 12/01/18  Yes Donzetta Starch, NP  aspirin 81 MG chewable tablet Place 1 tablet (81 mg total) into feeding tube daily. Crush med prior to putting in tube 12/02/18  Yes Biby, Massie Kluver, NP  dicyclomine (BENTYL) 10 MG capsule Place 1 capsule (10 mg total) into feeding tube 4 (four) times daily as needed for spasms. Crush med prior to putting in tube 12/02/18  Yes Biby, Massie Kluver, NP  hydrochlorothiazide (MICROZIDE) 12.5 MG capsule Place 1 capsule (12.5 mg total) into feeding tube daily. Crush med prior to putting in tube 12/02/18 03/02/19 Yes Biby, Massie Kluver, NP  irbesartan (AVAPRO) 300 MG tablet Place 1 tablet (300 mg total) into feeding tube daily. Crush med prior to putting in tube 12/02/18  Yes Biby, Massie Kluver, NP  metFORMIN (GLUCOPHAGE) 850 MG tablet Place 1 tablet (850 mg total) into feeding tube 2 (two) times daily with a meal. Crush med prior to putting in tube 12/02/18  Yes Biby, Massie Kluver, NP  metoprolol tartrate (LOPRESSOR) 50 MG tablet Place 1 tablet (50 mg total) into feeding tube 2 (two) times daily. Crush med prior to putting in tube 12/02/18 03/02/19 Yes Biby, Massie Kluver, NP  Nutritional Supplements (OSMOLITE) LIQD Give 1 can 5 times daily 12/01/18  Yes Rosalin Hawking, MD  pantoprazole sodium (PROTONIX) 40 mg/20 mL PACK Place 20  mLs (40 mg total) into feeding tube at bedtime. 12/02/18  Yes Donzetta Starch, NP  simvastatin (ZOCOR) 20 MG tablet Place 1 tablet (20 mg total) into feeding tube at bedtime. Crush med prior to putting in tube 12/02/18  Yes Biby, Massie Kluver, NP  ENTRESTO 49-51 MG Take 1 tablet by mouth 2 (two) times daily. 12/07/18   [provider]  Water For Irrigation, Sterile (FREE WATER) SOLN Place 150 mLs into feeding tube every 4 (four) hours. 12/01/18   Donzetta Starch, NP    Scheduled Meds: .  aspirin  81 mg Per Tube Daily  . chlorhexidine  15 mL Mouth Rinse BID  . free water  200 mL Per Tube Q4H  . insulin glargine  6 Units Subcutaneous QHS  . mouth rinse  15 mL Mouth Rinse q12n4p  . metoprolol tartrate  50 mg Per Tube BID  . simvastatin  20 mg Per Tube QHS   Continuous Infusions: . cefTRIAXone (ROCEPHIN)  IV Stopped (12/19/18 1951)  . dextrose 50 mL/hr at 12/20/18 0612  . heparin 550 Units/hr (12/20/18 0644)  . pantoprozole (PROTONIX) infusion 8 mg/hr (12/20/18 0612)   PRN Meds:.  Allergies as of 12/18/2018  . (No Known Allergies)    Family History  Problem Relation Age of Onset  . Heart disease Mother   . Diabetes Mother   . Heart disease Brother   . Diabetes Brother     Social History   Socioeconomic History  . Marital status: Widowed    Spouse name: Not on file  . Number of children: Not on file  . Years of education: Not on file  . Highest education level: Not on file  Occupational History  . Not on file  Social Needs  . Financial resource strain: Not on file  . Food insecurity    Worry: Not on file    Inability: Not on file  . Transportation needs    Medical: Not on file    Non-medical: Not on file  Tobacco Use  . Smoking status: Former Smoker    Packs/day: 1.00    Types: Cigarettes    Quit date: 03/23/1987    Years since quitting: 31.7  . Smokeless tobacco: Never Used  Substance and Sexual Activity  . Alcohol use: No  . Drug use: Not on file  . Sexual activity: Not on file  Lifestyle  . Physical activity    Days per week: Not on file    Minutes per session: Not on file  . Stress: Not on file  Relationships  . Social Herbalist on phone: Not on file    Gets together: Not on file    Attends religious service: Not on file    Active member of club or organization: Not on file    Attends meetings of clubs or organizations: Not on file    Relationship status: Not on file  . Intimate partner violence    Fear of current or ex  partner: Not on file    Emotionally abused: Not on file    Physically abused: Not on file    Forced sexual activity: Not on file  Other Topics Concern  . Not on file  Social History Narrative  . Not on file    Review of Systems: All negative except as stated above in HPI.  Physical Exam: Vital signs: Vitals:   12/20/18 0839 12/20/18 1009  BP: (!) 116/43   Pulse: 74 83  Resp:  20   Temp: 97.7 F (36.5 C)   SpO2: (!) 82% 100%   Last BM Date: 12/18/18 General:   Somnolent, nonverbal and not following commands, cachetic, elderly, frail, no acute distress Head: atraumatic, temporal wasting  Eyes: anicteric sclera ENT: oropharynx clear Neck: thin, nontender Lungs:  Clear throughout to auscultation.   No wheezes, crackles, or rhonchi. No acute distress. Heart:  Regular rate and rhythm; no murmurs, clicks, rubs,  or gallops. Abdomen: soft, nontender, nondistended, +BS, PEG tube intact with abdominal binder in place  Rectal:  Deferred Ext: no edema  GI:  Lab Results: Recent Labs    12/18/18 1455 12/19/18 0520 12/19/18 2108 12/20/18 0254 12/20/18 0525  WBC 12.2* 10.0  --  8.4  --   HGB 8.2* 7.2* 7.9* 7.3* 7.3*  HCT 27.3* 23.5* 25.5* 23.2* 24.3*  PLT 480* 400  --  356  --    BMET Recent Labs    12/19/18 2108 12/20/18 0254 12/20/18 0525  NA 150* 148* 147*  K 4.1 3.6 3.7  CL 122* 122* 121*  CO2 15* 18* 16*  GLUCOSE 184* 194* 162*  BUN 55* 47* 46*  CREATININE 1.02* 0.98 0.96  CALCIUM 8.3* 8.2* 8.1*   LFT Recent Labs    12/18/18 1432  PROT 5.0*  ALBUMIN 2.0*  AST 27  ALT 29  ALKPHOS 48  BILITOT 0.5   PT/INR No results for input(s): LABPROT, INR in the last 72 hours.   Studies/Results: Ct Angio Head W Or Wo Contrast  Result Date: 12/18/2018 CLINICAL DATA:  Altered mental status. Recent large right MCA infarct. EXAM: CT ANGIOGRAPHY HEAD TECHNIQUE: Multidetector CT imaging of the head was performed using the standard protocol during bolus administration  of intravenous contrast. Multiplanar CT image reconstructions and MIPs were obtained to evaluate the vascular anatomy. CONTRAST:  6mL OMNIPAQUE IOHEXOL 350 MG/ML SOLN COMPARISON:  Head CT 11/25/2018, head MRI 11/21/2018, and head CTA 11/20/2018 FINDINGS: CT HEAD Brain: A large right MCA territory infarct demonstrates decreased edema and mass effect. There is no residual midline shift or residual mass effect on the right lateral ventricle. There is no evidence of significant interval infarct extension. Scattered small regions of gyral hyperattenuation within the infarct are new and may reflect petechial hemorrhage and/or mineralization including a more confluent 2 cm region in the right superior frontal gyrus. No new infarct is identified. Small chronic infarcts are again seen in the left frontal lobe, left occipital lobe, and cerebellum. There is no extra-axial fluid collection. Vascular: Calcified atherosclerosis at the skull base. No suspicious vascular hyperdensity. Skull: No fracture or focal osseous lesion. Sinuses: Visualized paranasal sinuses and mastoid air cells are clear. Orbits: Right cataract extraction. CTA HEAD Anterior circulation: The distal cervical and intracranial portions of the right internal carotid artery are patent with calcified plaque resulting in moderate cavernous and paraclinoid stenoses, unchanged from the recent prior study. A 2 mm aneurysm projects inferiorly from the right cavernous ICA. As seen on the prior study, there is chronic occlusion of the left internal carotid artery in the neck. There is reconstitution of the ICA terminus with patency of the left ACA and MCA origins. The previously revascularized right M1 segment remains patent without significant stenosis. No proximal branch occlusion is evident either. Posterior circulation: The visualized distal left vertebral artery is patent. There is only at most faint opacification of the right V3 segment which was better opacified  on the prior CTA although the vessel was previously shown to be occluded  at its origin with distal reconstitution. The right V4 segment is grossly patent though diminutive with severe multifocal narrowing. The left PICA and right AICA appear dominant. The basilar artery is patent with mild diffuse irregularity but no significant stenosis. PCAs are patent with mild irregularity but no significant proximal stenosis. No aneurysm is identified. Venous sinuses: Not well evaluated due to arterial contrast timing. Anatomic variants: None. IMPRESSION: 1. Evolving large right MCA territory infarct with decreased edema and mass effect and no residual midline shift. New small volume petechial hemorrhage and/or mineralization. 2. No new infarct identified. 3. Persistent patency of the right MCA status post endovascular revascularization of an M1 occlusion. 4. 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 5. Unchanged moderate right intracranial ICA stenoses. 6. 2 mm right cavernous ICA aneurysm. Electronically Signed   By: Logan Bores M.D.   On: 12/18/2018 18:20   Dg Chest Portable 1 View  Result Date: 12/18/2018 CLINICAL DATA:  Altered mental status EXAM: PORTABLE CHEST 1 VIEW COMPARISON:  11/25/2018 FINDINGS: Numerous wires and leads overlying the patient's chest. Curvilinear tubing over the left chest. No focal opacity or pleural effusion. Mild cardiomegaly with aortic atherosclerosis. No pneumothorax. IMPRESSION: No active disease.  Mild cardiomegaly. Electronically Signed   By: Donavan Foil M.D.   On: 12/18/2018 15:24    Impression/Plan: Anemia with heme positive stool on Eliquis for recent CVA. Asked nurse to flush PEG tube and nonbloody gastric contents and water returned. No signs of bleeding from PEG tube flushing. I do not think an EGD is warranted and would manage conservatively with her severely debilitated state. Ok to use PEG tube for meds but would hold off on tube feeds until tomorrow  and defer to nutrition the rate/type. On IV Heparin. Continue Protonix drip until tomorrow and if stable then change to IV Q 12 hours. Will follow.    LOS: 2 days   [name]  12/20/2018, 10:15 AM  Questions please call [phone]

## 2018-12-20 NOTE — Plan of Care (Signed)
  Problem: Clinical Measurements: Goal: Ability to maintain clinical measurements within normal limits will improve Outcome: Progressing   Problem: Nutrition: Goal: Adequate nutrition will be maintained Outcome: Progressing   

## 2018-12-20 NOTE — Progress Notes (Signed)
Patient resting, HR better tonight. Son at bedside.

## 2018-12-20 NOTE — Progress Notes (Signed)
ANTICOAGULATION CONSULT NOTE   Pharmacy Consult for Heparin Indication: Recent Embolic Stroke, afib  No Known Allergies  Patient Measurements: Weight: 86 lb 8 oz (39.2 kg) Heparin Dosing Weight Total Body Weight = 38.5 kg  Vital Signs: Temp: 97.7 F (36.5 C) (09/30 0839) Temp Source: Oral (09/30 0839) BP: 116/43 (09/30 0839) Pulse Rate: 83 (09/30 1009)  Labs: Recent Labs    12/18/18 1455 12/19/18 0520 12/19/18 1841  12/20/18 0254 12/20/18 0525 12/20/18 1351  HGB 8.2* 7.2*  --    < > 7.3* 7.3* 7.8*  HCT 27.3* 23.5*  --    < > 23.2* 24.3* 26.9*  PLT 480* 400  --   --  356  --   --   APTT  --   --  24  --  33  --  42*  HEPARINUNFRC  --   --  1.70*  --   --  1.02*  --   CREATININE  --  1.18*  --    < > 0.98 0.96 0.92   < > = values in this interval not displayed.    Estimated Creatinine Clearance: 32.2 mL/min (by C-G formula based on SCr of 0.92 mg/dL).   Medical History: Past Medical History:  Diagnosis Date  . Arthritis   . CAD (coronary artery disease)   . Carotid artery occlusion   . Diabetes mellitus   . Hyperlipidemia   . Hypertension   . Myocardial infarction ([place])   . Stroke [facility])     Assessment: 78 yr old female admitted on 12/18/18 for altered mental status and weakness. Pt had recent embolic stroke involving right MCA, S/P TPA. Pt has hx of a fib and was taking apixaban 5 mg po BID PTA (last dose was at 0700 on 9/28, per med rec), along with Plavix and ASA. Scr up to 1.18 from baseline of 0.9-1.0); CrCl ~25 ml/min. Fecal occult blood was positive in ED, and H/H in ED decreased from 2 wks ago, so anticoagulation initially held during this admission. Pharmacy now consulted to dose IV heparin (no bolus) in this pt with recent embolic stroke who was on apixaban PTA.  9/30 AM update:  APTT is sub-therapeutic, no issues per RN  Goal of Therapy:  Heparin level 0.3-0.5 units/ml aPTT 66-84 seconds Monitor platelets by anticoagulation protocol: Yes   Plan:   - aPTT sub-therapeutic at this time - Will increase to Heparin 650 units/hr - Will recheck aPTT in 6 hours and heparin level with AM labs    Duanne Limerick, PharmD, [place] Pharmacist Phone: [phone]

## 2018-12-20 NOTE — Progress Notes (Addendum)
PROGRESS NOTE    [name]  [id number]  DOB: 1940-10-01  DOA: 12/18/2018 PCP: Eloy End, MD  Brief Narrative:  78 y.o. female with medical history significant of CAD, bilateral carotid artery stenosis status post right carotid enterectomy, myocardial infarction, hypertension, mitral valve insufficiency, atrial fibrillation on Eliquis, recent embolic stroke involving the right MCA status post TPA and unsuccessful IR brought in by son in concern for altered mental status and increasing weakness.  Patient was recently admitted from 8/31 to 9/12 with mechanical ventilation secondary to acute hypoxia respiratory failure with findings of embolic stroke involving the right MCA status post TPA and unsuccessful IR, and ultimately requiring PEG tube placement.  Since returning home, patient has had residual left-sided weakness and slurred speech at baseline.  Son reports that he was still talking to her and getting her up out of bed daily.  However, about 2 days ago she became more nonverbal and was sleeping a lot more and had 2 episodes of diarrhea prompting him to bring her in for further evaluation. ED Course:  mildly tachycardic up to 103.CBC shows mildly elevated WBC of 12.2 and a hemoglobin of 8.2 which is decreased from 10.8 two weeks ago.  Fecal occult blood was also positive.  CMP showed elevated sodium of 150 and increase creatinine of 1.20 and elevated BUN of 89.  Urinalysis was positive for large leukocytes and negative nitrites with few bacteria, WBC and hyaline casts.  CTA Angie of the head showed evolving large right MCA territory infarct from prior but no new infarct identified. Per d/w son: He states that patient tolerated continuous tube feeds well during prior hospitalization.  She however developed diarrhea with bolus feeds at home.  He states she was progressing well with home physical therapy until past Friday (9/25) when she started to sleep more and appeared  tired/progressively worse in terms of mental status.  He denies noticing any black stools but reports "dark" stools.  Subjective:  Patient seen and examined.  Son at bedside and feels patient much improved today, more awake and communicating-close to how she was at previous discharge.  Son reported to me that GI stopped by to see patient and was okay with starting tube feeds.  Objective: Vitals:   12/19/18 2327 12/20/18 0425 12/20/18 0839 12/20/18 1009  BP: (!) 101/44 (!) 107/38 (!) 116/43   Pulse: 97 73 74 83  Resp:  18 20   Temp:  98.4 F (36.9 C) 97.7 F (36.5 C)   TempSrc:  Oral Oral   SpO2:  100% (!) 82% 100%  Weight:  39.2 kg      Intake/Output Summary (Last 24 hours) at 12/20/2018 1655 Last data filed at 12/20/2018 1355 Gross per 24 hour  Intake 1474.79 ml  Output --  Net 1474.79 ml   Filed Weights   12/18/18 2147 12/19/18 0300 12/20/18 0425  Weight: 38.3 kg 38.5 kg 39.2 kg    Physical Examination:  General exam: Appears pleasantly confused, no acute distress Respiratory system: Clear to auscultation. Respiratory effort normal. Cardiovascular system: S1 & S2 heard. No JVD, murmurs. No pedal edema. Gastrointestinal system: Status post PEG.  Abdomen is nondistended, soft and nontender. No organomegaly or masses felt. Normal bowel sounds heard. Central nervous system: Dysarthria, left dense hemiplegia from recent stroke Skin: Stage 2 pressure ulcer on left buttock/sacral  Psychiatry: Judgement and insight appear impaired. Mood & affect appropriate.     Data Reviewed: I have personally reviewed following labs and imaging  studies  CBC: Recent Labs  Lab 12/18/18 1455 12/19/18 0520 12/19/18 2108 12/20/18 0254 12/20/18 0525 12/20/18 1351  WBC 12.2* 10.0  --  8.4  --   --   NEUTROABS 9.5*  --   --   --   --   --   HGB 8.2* 7.2* 7.9* 7.3* 7.3* 7.8*  HCT 27.3* 23.5* 25.5* 23.2* 24.3* 26.9*  MCV 89.2 88.7  --  85.6  --   --   PLT 480* 400  --  356  --   --     Basic Metabolic Panel: Recent Labs  Lab 12/19/18 0520 12/19/18 2108 12/20/18 0254 12/20/18 0525 12/20/18 1351  NA 151* 150* 148* 147* 148*  K 4.1 4.1 3.6 3.7 3.8  CL 122* 122* 122* 121* 119*  CO2 18* 15* 18* 16* 21*  GLUCOSE 185* 184* 194* 162* 178*  BUN 80* 55* 47* 46* 33*  CREATININE 1.18* 1.02* 0.98 0.96 0.92  CALCIUM 8.5* 8.3* 8.2* 8.1* 8.6*   GFR: Estimated Creatinine Clearance: 32.2 mL/min (by C-G formula based on SCr of 0.92 mg/dL). Liver Function Tests: Recent Labs  Lab 12/18/18 1432  AST 27  ALT 29  ALKPHOS 48  BILITOT 0.5  PROT 5.0*  ALBUMIN 2.0*   No results for input(s): LIPASE, AMYLASE in the last 168 hours. No results for input(s): AMMONIA in the last 168 hours. Coagulation Profile: No results for input(s): INR, PROTIME in the last 168 hours. Cardiac Enzymes: No results for input(s): CKTOTAL, CKMB, CKMBINDEX, TROPONINI in the last 168 hours. BNP (last 3 results) No results for input(s): PROBNP in the last 8760 hours. HbA1C: No results for input(s): HGBA1C in the last 72 hours. CBG: Recent Labs  Lab 12/19/18 1749 12/19/18 2208 12/20/18 1138  GLUCAP 114* 154* 140*   Lipid Profile: No results for input(s): CHOL, HDL, LDLCALC, TRIG, CHOLHDL, LDLDIRECT in the last 72 hours. Thyroid Function Tests: No results for input(s): TSH, T4TOTAL, FREET4, T3FREE, THYROIDAB in the last 72 hours. Anemia Panel: Recent Labs    12/18/18 2311  VITAMINB12 807  FERRITIN 38  TIBC 375  IRON 12*   Sepsis Labs: No results for input(s): PROCALCITON, LATICACIDVEN in the last 168 hours.  Recent Results (from the past 240 hour(s))  SARS CORONAVIRUS 2 (TAT 6-24 HRS) Nasopharyngeal Nasopharyngeal Swab     Status: None   Collection Time: 12/18/18  2:42 PM   Specimen: Nasopharyngeal Swab  Result Value Ref Range Status   SARS Coronavirus 2 NEGATIVE NEGATIVE Final    Comment: (NOTE) SARS-CoV-2 target nucleic acids are NOT DETECTED. The SARS-CoV-2 RNA is generally  detectable in upper and lower respiratory specimens during the acute phase of infection. Negative results do not preclude SARS-CoV-2 infection, do not rule out co-infections with other pathogens, and should not be used as the sole basis for treatment or other patient management decisions. Negative results must be combined with clinical observations, patient history, and epidemiological information. The expected result is Negative. Fact Sheet for Patients: SugarRoll.be Fact Sheet for Healthcare Providers: https://www.woods-mathews.com/ This test is not yet approved or cleared by the [place] FDA and  has been authorized for detection and/or diagnosis of SARS-CoV-2 by FDA under an Emergency Use Authorization (EUA). This EUA will remain  in effect (meaning this test can be used) for the duration of the COVID-19 declaration under Section 56 4(b)(1) of the Act, 21 U.S.C. section 360bbb-3(b)(1), unless the authorization is terminated or revoked sooner. Performed at [facility], [place]  [address] [place], [place] [address]   Urine Culture     Status: Abnormal (Preliminary result)   Collection Time: 12/18/18  2:56 PM   Specimen: Urine, Catheterized  Result Value Ref Range Status   Specimen Description URINE, CATHETERIZED  Final   Special Requests   Final    NONE Performed at [facility], [place] [address], [place], [place] [address]    Culture (A)  Final    >=100,000 COLONIES/mL PSEUDOMONAS AERUGINOSA >=100,000 COLONIES/mL ENTEROCOCCUS FAECALIS    Report Status PENDING  Incomplete      Radiology Studies: Ct Angio Head W Or Wo Contrast  Result Date: 12/18/2018 CLINICAL DATA:  Altered mental status. Recent large right MCA infarct. EXAM: CT ANGIOGRAPHY HEAD TECHNIQUE: Multidetector CT imaging of the head was performed using the standard protocol during bolus administration of intravenous contrast. Multiplanar CT image  reconstructions and MIPs were obtained to evaluate the vascular anatomy. CONTRAST:  60mL OMNIPAQUE IOHEXOL 350 MG/ML SOLN COMPARISON:  Head CT 11/25/2018, head MRI 11/21/2018, and head CTA 11/20/2018 FINDINGS: CT HEAD Brain: A large right MCA territory infarct demonstrates decreased edema and mass effect. There is no residual midline shift or residual mass effect on the right lateral ventricle. There is no evidence of significant interval infarct extension. Scattered small regions of gyral hyperattenuation within the infarct are new and may reflect petechial hemorrhage and/or mineralization including a more confluent 2 cm region in the right superior frontal gyrus. No new infarct is identified. Small chronic infarcts are again seen in the left frontal lobe, left occipital lobe, and cerebellum. There is no extra-axial fluid collection. Vascular: Calcified atherosclerosis at the skull base. No suspicious vascular hyperdensity. Skull: No fracture or focal osseous lesion. Sinuses: Visualized paranasal sinuses and mastoid air cells are clear. Orbits: Right cataract extraction. CTA HEAD Anterior circulation: The distal cervical and intracranial portions of the right internal carotid artery are patent with calcified plaque resulting in moderate cavernous and paraclinoid stenoses, unchanged from the recent prior study. A 2 mm aneurysm projects inferiorly from the right cavernous ICA. As seen on the prior study, there is chronic occlusion of the left internal carotid artery in the neck. There is reconstitution of the ICA terminus with patency of the left ACA and MCA origins. The previously revascularized right M1 segment remains patent without significant stenosis. No proximal branch occlusion is evident either. Posterior circulation: The visualized distal left vertebral artery is patent. There is only at most faint opacification of the right V3 segment which was better opacified on the prior CTA although the vessel was  previously shown to be occluded at its origin with distal reconstitution. The right V4 segment is grossly patent though diminutive with severe multifocal narrowing. The left PICA and right AICA appear dominant. The basilar artery is patent with mild diffuse irregularity but no significant stenosis. PCAs are patent with mild irregularity but no significant proximal stenosis. No aneurysm is identified. Venous sinuses: Not well evaluated due to arterial contrast timing. Anatomic variants: None. IMPRESSION: 1. Evolving large right MCA territory infarct with decreased edema and mass effect and no residual midline shift. New small volume petechial hemorrhage and/or mineralization. 2. No new infarct identified. 3. Persistent patency of the right MCA status post endovascular revascularization of an M1 occlusion. 4. 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 5. Unchanged moderate right intracranial ICA stenoses. 6. 2 mm right cavernous ICA aneurysm. Electronically Signed   By: Logan Bores M.D.   On: 12/18/2018  18:20        Scheduled Meds:  aspirin  81 mg Per Tube Daily   chlorhexidine  15 mL Mouth Rinse BID   feeding supplement (OSMOLITE 1.2 CAL)  1,000 mL Per Tube Q24H   free water  200 mL Per Tube Q4H   insulin glargine  6 Units Subcutaneous QHS   mouth rinse  15 mL Mouth Rinse q12n4p   metoprolol tartrate  50 mg Per Tube BID   simvastatin  20 mg Per Tube QHS   Continuous Infusions:  cefTRIAXone (ROCEPHIN)  IV Stopped (12/19/18 1951)   dextrose 50 mL/hr at 12/20/18 1355   heparin 550 Units/hr (12/20/18 1355)   pantoprozole (PROTONIX) infusion 8 mg/hr (12/20/18 1355)    Assessment & Plan:  1.  Metabolic encephalopathy: Present on admission.  Likely secondary to hypernatremia, dehydration, uremia and possible UTI in the setting of recent stroke.  Appears to be improving slowly.  Continue to monitor closely.  2.  Hypernatremia: In the setting of diarrhea/dehydration.  Could be related to recent stroke as well. Improved from 151-->147 this morning.  Son states he was administering 150 cc of free water via tube feed 4 times a day at home.  Currently on 150 cc free water every 4 hours.  Will increase to 200 cc and continue hypotonic IV fluids with D5W ( cautious given history of diabetes)-increase rate as repeat sodium this afternoon still at 148. BMP every 12 hours.   3.  AKI/uremia: Continue IV hydration as well as via PEG tube and continue to hold ACE inhibitors.  Disproportionate elevation in BUN could be secondary to GI bleed as well.  4.  Acute on chronic anemia: Fecal occult blood test positive on presentation.  Hemoglobin dropped to 8.2 from last discharge level of 10.8.  Could be secondary to upper GI bleed from PEG site mucosal injury, especially in the setting of multiple antiplatelets/anticoagulants as discussed with GI Dr. Paulita Fujita who evaluated patient today.  Unlikely to benefit from another EGD per GI as recent EGD during PEG placement reported normal esophagus/stomach and also given the risks in the setting of recent stroke. Started on Protonix drip and monitoring closely on IV heparin overnight-Hgb stable around 7.8-8. Seen by GI and PEG site bleeding ruled out. GI recommended starting tube feeds in am (per their note although ordered earlier per d/w son).   5.  Recent embolic CVA: Patient was discharged on Eliquis in addition to aspirin and cilostazol that she was taking.  Could have contributed to problem #4.  Currently resumed on aspirin and statins.  Eliquis held on admission and concern for GI bleed.  Discussed with son regarding benefits and risks of continuing and holding anticoagulation respectively.  He agreed with resuming heparin and monitoring closely as there is no evidence of gross GI bleeding. So far tolerating okay-Change H&H to daily   6.  Paroxysmal atrial fibrillation: Now in normal sinus rhythm.  Eliquis held on admission.  Continue  metoprolol.  Heparin without bolus to be initiated per pharmacy consult  7. UTI: Patient had abnormal UA with pyuria on presentation.  On IV Rocephin.  Leukocytosis now resolved.  Urine cx reporting pseudomonas/Enterococci-- ?contamination. f/u sensitivities. Will change to Unasyn for now.  8.  Dysphagia/dysarthria: In the setting of recent stroke.  Status post PEG tube in previous admission.  Will need speech/nutrition evaluation to advise on optimal diet as per son patient was not tolerating bolus feeds well.    9.  PAD/carotid artery  stenosis: Status post carotid endarterectomy.  On aspirin/cilostazol/statins at baseline  10.  Diabetes mellitus: Patient on metformin at home.  Currently on dextrose fluid in concern for hypernatremia.  Watch blood glucose closely, continue sliding scale insulin.  Added Lantus for additional coverage while on dextrose fluids-so far BG okay-titrate as needed.  11. Pressure ulcer: pink foam patch in place. Wound care consult   DVT prophylaxis: SCDs Code Status: DNR Family / Patient Communication: Discussed with son bedside Disposition Plan: TBD.  Son previously declined SNF rehab and took her home     LOS: 2 days    Time spent: 35 minutes    [place]ford Shi, MD [facility] Pager [phone]  If 7PM-7AM, please contact night-coverage [email or web address] Password [email or web address] 12/20/2018, 4:55 PM

## 2018-12-21 DIAGNOSIS — D62 Acute posthemorrhagic anemia: Secondary | ICD-10-CM

## 2018-12-21 LAB — CBC
HCT: 20.8 % — ABNORMAL LOW (ref 36.0–46.0)
HCT: 31.9 % — ABNORMAL LOW (ref 36.0–46.0)
HCT: 30.7 % — ABNORMAL LOW (ref 36.0–46.0)
Hemoglobin: 6.1 g/dL — CL (ref 12.0–15.0)
Hemoglobin: 10.4 g/dL — ABNORMAL LOW (ref 12.0–15.0)
Hemoglobin: 10.1 g/dL — ABNORMAL LOW (ref 12.0–15.0)
MCH: 26.2 pg (ref 26.0–34.0)
MCH: 27.8 pg (ref 26.0–34.0)
MCH: 27.4 pg (ref 26.0–34.0)
MCHC: 29.3 g/dL — ABNORMAL LOW (ref 30.0–36.0)
MCHC: 32.6 g/dL (ref 30.0–36.0)
MCHC: 32.9 g/dL (ref 30.0–36.0)
MCV: 89.3 fL (ref 80.0–100.0)
MCV: 85.3 fL (ref 80.0–100.0)
MCV: 83.4 fL (ref 80.0–100.0)
Platelets: 352 10*3/uL (ref 150–400)
Platelets: 308 10*3/uL (ref 150–400)
Platelets: 307 10*3/uL (ref 150–400)
RBC: 2.33 MIL/uL — ABNORMAL LOW (ref 3.87–5.11)
RBC: 3.74 MIL/uL — ABNORMAL LOW (ref 3.87–5.11)
RBC: 3.68 MIL/uL — ABNORMAL LOW (ref 3.87–5.11)
RDW: 16.1 % — ABNORMAL HIGH (ref 11.5–15.5)
RDW: 16.7 % — ABNORMAL HIGH (ref 11.5–15.5)
RDW: 17 % — ABNORMAL HIGH (ref 11.5–15.5)
WBC: 7 10*3/uL (ref 4.0–10.5)
WBC: 8 10*3/uL (ref 4.0–10.5)
WBC: 8.5 10*3/uL (ref 4.0–10.5)
nRBC: 1 % — ABNORMAL HIGH (ref 0.0–0.2)
nRBC: 1 % — ABNORMAL HIGH (ref 0.0–0.2)
nRBC: 1.2 % — ABNORMAL HIGH (ref 0.0–0.2)

## 2018-12-21 LAB — GLUCOSE, CAPILLARY
Glucose-Capillary: 109 mg/dL — ABNORMAL HIGH (ref 70–99)
Glucose-Capillary: 142 mg/dL — ABNORMAL HIGH (ref 70–99)
Glucose-Capillary: 26 mg/dL — CL (ref 70–99)
Glucose-Capillary: 114 mg/dL — ABNORMAL HIGH (ref 70–99)
Glucose-Capillary: 91 mg/dL (ref 70–99)

## 2018-12-21 LAB — PREPARE RBC (CROSSMATCH)

## 2018-12-21 LAB — BASIC METABOLIC PANEL
Anion gap: 9 (ref 5–15)
BUN: 15 mg/dL (ref 8–23)
CO2: 17 mmol/L — ABNORMAL LOW (ref 22–32)
Calcium: 7.9 mg/dL — ABNORMAL LOW (ref 8.9–10.3)
Chloride: 117 mmol/L — ABNORMAL HIGH (ref 98–111)
Creatinine, Ser: 0.94 mg/dL (ref 0.44–1.00)
GFR calc Af Amer: >60 mL/min (ref >60)
GFR calc non Af Amer: 59 mL/min — ABNORMAL LOW (ref >60)
Glucose, Bld: 242 mg/dL — ABNORMAL HIGH (ref 70–99)
Potassium: 3.5 mmol/L (ref 3.5–5.1)
Sodium: 143 mmol/L (ref 135–145)

## 2018-12-21 LAB — URINE CULTURE: Culture: >=100000 — AB

## 2018-12-21 LAB — APTT: aPTT: 52 seconds — ABNORMAL HIGH (ref 24–36)

## 2018-12-21 LAB — HEPARIN LEVEL (UNFRACTIONATED): Heparin Unfractionated: 0.7 IU/mL (ref 0.30–0.70)

## 2018-12-21 MED ORDER — JEVITY 1.2 CAL PO LIQD: 1000.0000 mL | ORAL | Status: DC

## 2018-12-21 MED ORDER — SODIUM CHLORIDE 0.9% IV SOLUTION: Freq: Once | INTRAVENOUS | Status: DC

## 2018-12-21 MED ORDER — DEXTROSE 50 % IV SOLN
INTRAVENOUS | Status: AC
  Administered 2018-12-21: 50 mL
  Filled 2018-12-21: qty 50

## 2018-12-21 MED ORDER — SODIUM CHLORIDE 0.9 % IV SOLN
INTRAVENOUS | Status: DC | PRN
  Administered 2018-12-21: 01:00:00 250 mL via INTRAVENOUS

## 2018-12-21 MED ORDER — INSULIN ASPART 100 UNIT/ML ~~LOC~~ SOLN
0.0000 [IU] | SUBCUTANEOUS | Status: DC
  Administered 2018-12-21: 2 [IU] via SUBCUTANEOUS
  Administered 2018-12-22: 21:00:00 3 [IU] via SUBCUTANEOUS
  Administered 2018-12-22: 2 [IU] via SUBCUTANEOUS
  Administered 2018-12-22: 01:00:00 1 [IU] via SUBCUTANEOUS
  Administered 2018-12-22: 05:00:00 2 [IU] via SUBCUTANEOUS
  Administered 2018-12-23: 09:00:00 3 [IU] via SUBCUTANEOUS
  Administered 2018-12-23: 1 [IU] via SUBCUTANEOUS
  Administered 2018-12-23: 5 [IU] via SUBCUTANEOUS
  Administered 2018-12-23: 2 [IU] via SUBCUTANEOUS
  Administered 2018-12-23: 1 [IU] via SUBCUTANEOUS
  Administered 2018-12-23 – 2018-12-24 (×2): 2 [IU] via SUBCUTANEOUS
  Administered 2018-12-24: 10:00:00 3 [IU] via SUBCUTANEOUS
  Administered 2018-12-24: 1 [IU] via SUBCUTANEOUS
  Administered 2018-12-24: 13:00:00 3 [IU] via SUBCUTANEOUS
  Administered 2018-12-24 – 2018-12-25 (×4): 2 [IU] via SUBCUTANEOUS
  Administered 2018-12-25: 3 [IU] via SUBCUTANEOUS
  Administered 2018-12-25: 1 [IU] via SUBCUTANEOUS
  Administered 2018-12-25: 21:00:00 3 [IU] via SUBCUTANEOUS
  Administered 2018-12-25: 05:00:00 2 [IU] via SUBCUTANEOUS
  Administered 2018-12-26 (×2): 1 [IU] via SUBCUTANEOUS
  Administered 2018-12-26: 08:00:00 3 [IU] via SUBCUTANEOUS
  Administered 2018-12-26: 01:00:00 2 [IU] via SUBCUTANEOUS

## 2018-12-21 MED ORDER — PANTOPRAZOLE SODIUM 40 MG IV SOLR
40.0000 mg | Freq: Two times a day (BID) | INTRAVENOUS | Status: DC
  Administered 2018-12-21 – 2018-12-26 (×11): 40 mg via INTRAVENOUS
  Filled 2018-12-21 (×11): qty 40

## 2018-12-21 MED ORDER — DEXTROSE 10 % IV SOLN
INTRAVENOUS | Status: DC
  Administered 2018-12-21 – 2018-12-22 (×3): via INTRAVENOUS

## 2018-12-21 MED ORDER — SODIUM CHLORIDE 0.9 % IV BOLUS: 1000.0000 mL | Freq: Once | INTRAVENOUS | Status: DC

## 2018-12-21 MED ORDER — OSMOLITE 1.2 CAL PO LIQD: 1000.0000 mL | ORAL | Status: DC

## 2018-12-21 NOTE — Progress Notes (Signed)
RN has given bedside report to [name] on 4E.  Pt awake and alert during this time.

## 2018-12-21 NOTE — Significant Event (Signed)
Rapid Response Event Note  Overview: Time Called: [id number] Arrival Time: 0346 Event Type: Other (Comment), Hypotension   Called about pt with hgb drop to 4.1 and slightly hypotensive.   Initial Focused Assessment: On arrival, pt laying in bed responsive to pain. HR 79, BP 83/59, spO2 100% on RA, RR 18. No signs of active bleeding. Per RN, pt has not had a BM and is voiding. PEG tube in place clamped with no residual noted. Hep gtt stopped at 0300.   Interventions: 1L Bolus Provider paged and at bedside Repeat CBC, type and screen   Plan of Care (if not transferred): Transferred to PCU- 4E23 Event Summary: Name of Physician Notified: Baltazar Najjar at (Prior to my arrival)    at    Outcome: Transferred (Comment)(Transferred to 4E23)  Event End Time: [place]

## 2018-12-21 NOTE — Consult Note (Addendum)
[place] Nurse wound consult note Patient receiving care in [place].  Consult is being performed remotely by review of record and input from primary RN, [name]. Reason for Consult: "pressure ulcer on left buttock/sacral area".  [name] has agreed to page me when she has had the opportunity to view the wound in order for [place] to discuss it's characteristics.  Additional notes to be done at that time.  I spoke with [name], primary RN for patient.  She verifies that the wound has NO depth, is pink, moist and very similar in appearance to a wound that might result from the skin layer coming off of a blister.  There is a foam dressing on the area.  This should be continued.  I will enact the Skin Care Standing orders for this wound Wound type: stage 2 PI Measurement: See flowsheet Wound bed: pink, moist Drainage (amount, consistency, odor) none Periwound: intact Dressing procedure/placement/frequency: Foam dressing. Change every 3 days and prn. [name], RN, MSN, CWOCN, CNS-BC, pager [phone]

## 2018-12-21 NOTE — Progress Notes (Signed)
Nutrition Follow-up  DOCUMENTATION CODES:   Severe malnutrition in context of chronic illness, Underweight  INTERVENTION:   -If TF is resumed, recommend:  Initiate Osmolite 1.2 @ 20 ml/hr via PEG and increase by 10 ml every 8 hours to goal rate of 55 ml/hr.   Tube feeding regimen provides 1584 kcal (100% of needs), 73 grams of protein, and 1082 ml of H2O.   NUTRITION DIAGNOSIS:   Severe Malnutrition related to chronic illness(CVA) as evidenced by severe fat depletion, severe muscle depletion.  Ongoing  GOAL:   Patient will meet greater than or equal to 90% of their needs  Unmet  MONITOR:   Labs, Weight trends, TF tolerance, Skin, I & O's  REASON FOR ASSESSMENT:   Consult Enteral/tube feeding initiation and management  ASSESSMENT:   [name] is a 78 y.o. female with medical history significant of CAD, bilateral carotid artery stenosis status post right carotid enterectomy, myocardial infarction, hypertension, mitral valve insufficiency, atrial fibrillation on Eliquis, recent embolic stroke involving the right MCA status post TPA and unsuccessful IR who presented with altered mental status and increasing weakness.  Son at bedside provides history as patient has incoherent speech.  Patient was recently admitted from 8/31 to 9/12 with mechanical ventilation secondary to acute hypoxia respiratory failure with findings of embolic stroke involving the right MCA status post TPA and unsuccessful IR, and ultimately requiring PEG tube placement.  Since returning home, patient has had residual left-sided weakness and slurred speech at baseline.  Son reports that he was still talking to her and getting her up out of bed daily.  However, about 2 days ago she became more nonverbal and was sleeping a lot more and had 2 episodes of diarrhea prompting him to bring her into the emergency department today for further evaluation  Reviewed I/O's: +396 ml x 24 hours and +2.2 L since  admission  UOP: 300 ml x 24 hours  Tube feedings were initially planned to be started yesterday evening, however, TF on hold due to drop in Hgb. Per GI note today, recommending palliative measures. Deferring to medicine plan to start TF.   Labs reviewed: CBGS: 109-142 (inpatient orders for glycemic control are 0-9 units insulin aspart every 4 hours and 8 units insulin glargine q HS).   Diet Order:   Diet Order            Diet NPO time specified  Diet effective now              EDUCATION NEEDS:   No education needs have been identified at this time  Skin:  Skin Assessment: Skin Integrity Issues: Skin Integrity Issues:: Stage II Stage II: sacrum  Last BM:  12/18/18  Height:   Ht Readings from Last 1 Encounters:  11/20/18 5\' 1"  (1.549 m)    Weight:   Wt Readings from Last 1 Encounters:  12/20/18 39.2 kg    Ideal Body Weight:  47.7 kg  BMI:  Body mass index is 16.34 kg/m.  Estimated Nutritional Needs:   Kcal:  1400-1600  Protein:  65-80 grams  Fluid:  > 1.4 L    [name] A. Jimmye Norman, RD, LDN, [place] Registered Dietitian II Certified Diabetes Care and Education Specialist Pager: [phone] After hours Pager: [phone]

## 2018-12-21 NOTE — Progress Notes (Signed)
Update: Unsure as to what is going on with the lab, but a Hgb was just ran and is 6.1 before any blood has been given. BP up to 114 after bolus. Will give 2 units of blood instead of 3. Protonix back on. Hold starting of TF this am. Discussed with RN.  KJKG, NP [facility]

## 2018-12-21 NOTE — Progress Notes (Signed)
Hypoglycemic Event  CBG: 26  Treatment: 42mL dextrose given IV  Symptoms: none  Follow-up CBG: Time: 1637 CBG Result: 114  Possible Reasons for Event: tube feeds on hold today  Comments/MD notified: [place]    [place]

## 2018-12-21 NOTE — Progress Notes (Signed)
RN notified MD, concerning pt Hgb level of 6.0. Pt presented with hypotension.  1L NS bolus given via IV to pt.  Pt was seen by Rapid Response Nurse and Baltazar Najjar.  Pt will by transferred to step-down unit on 4E23. RN has notified Pt POA(son), concerning transfer.  Pt is alert to self during this time, wish responds to her name and light.

## 2018-12-21 NOTE — Progress Notes (Signed)
[facility] Gastroenterology Progress Note  [name] 78 y.o. 09-Jul-1940   Subjective: Shivering in bed. Non-verbal. Not following commands. Son at bedside.  Objective: Vital signs: Vitals:   12/21/18 0559 12/21/18 0832  BP: (!) 131/52 (!) 136/55  Pulse: 83 89  Resp:  18  Temp: 98.4 F (36.9 C) 98.2 F (36.8 C)  SpO2: 94% 100%    Physical Exam: Gen: somnolent, cachetic, elderly, frail, mild acute distress  CV: RRR Chest: CTA B Abd: soft, nontender, nondistended, +BS, PEG tube in place with binder Ext: no edema  Lab Results: Recent Labs    12/20/18 1351 12/20/18 2122  NA 148* 142  K 3.8 3.1*  CL 119* 114*  CO2 21* 17*  GLUCOSE 178* 182*  BUN 33* 28*  CREATININE 0.92 0.97  CALCIUM 8.6* 7.9*   Recent Labs    12/18/18 1432  AST 27  ALT 29  ALKPHOS 48  BILITOT 0.5  PROT 5.0*  ALBUMIN 2.0*   Recent Labs    12/18/18 1455  12/20/18 0254  12/21/18 0126 12/21/18 0350  WBC 12.2*   < > 8.4  --   --  7.0  NEUTROABS 9.5*  --   --   --   --   --   HGB 8.2*   < > 7.3*   < > 4.1* 6.1*  HCT 27.3*   < > 23.2*   < > 13.4* 20.8*  MCV 89.2   < > 85.6  --   --  89.3  PLT 480*   < > 356  --   --  352   < > = values in this interval not displayed.      Assessment/Plan: Significant drop in Hgb overnight without any signs of overt GI bleeding. PEG tube flushing negative for blood yesterday. IV Heparin on hold and blood transfusions ordered. I do NOT think she is having a GI bleed for the source of her anemia and would not recommend any invasive endoscopic procedures. Change to IV PPI Q 12 hours and d/c Protonix drip. Recommend palliative measures. Defer whether to restart tube feeds to the primary team, which was held due to the drop in Hgb. Spoke with son at the bedside.    [name] 12/21/2018, 9:07 AM  Questions please call [phone] ID: [name], female   DOB: [birth year], 78 y.o.   MRN: [id number]

## 2018-12-21 NOTE — Care Management Important Message (Signed)
Important Message  Patient Details  Name: [name] MRN: [id number] Date of Birth: 09/24/1940   Medicare Important Message Given:  Yes     Itati Brocksmith [address] 12/21/2018, 3:39 PM

## 2018-12-21 NOTE — Progress Notes (Signed)
Call to RN regarding PIV needs. RN reports 1 PIV leaking and need for 3 lines due to medications. States attempt in progress and will remove consult if no longer needed.

## 2018-12-21 NOTE — Progress Notes (Signed)
PROGRESS NOTE  [name] [id number] DOB: [birth year] DOA: 12/18/2018 PCP: Eloy End, MD  Brief History   78 y.o.femalewith medical history significant ofCAD, bilateral carotid artery stenosis status post right carotid enterectomy, myocardial infarction, hypertension, mitral valve insufficiency, atrial fibrillation on Eliquis, recent embolic stroke involving the right MCA status post TPA and unsuccessful IR brought in by son in concern for altered mental status and increasing weakness.  Patient was recently admitted from 8/31 to 9/12 with mechanical ventilation secondary toacutehypoxiarespiratory failure with findings ofembolic stroke involving the right MCA status post TPA and unsuccessful IR,and ultimately requiring PEG tube placement.Since returning home, patient has had residual left-sided weakness and slurred speech at baseline. Son reports that he was still talking to her and getting her up out of bed daily. However, about 2 days ago she became more nonverbal and was sleeping a lot more and had 2 episodes of diarrheaprompting him to bring her in for further evaluation. ED Course: mildly tachycardic up to 103.CBC shows mildly elevated WBC of 12.2 and a hemoglobin of 8.2 which is decreased from 10.8 two weeks ago. Fecal occult blood was also positive. CMP showed elevated sodium of 150 and increase creatinine of 1.20 and elevated BUN of 89. Urinalysis was positive for large leukocytes and negative nitrites with few bacteria, WBC and hyaline casts. CTA Angie of the head showed evolving large right MCA territory infarct from prior but no new infarct identified. Per d/w son: He states that patient tolerated continuous tube feeds well during prior hospitalization.  She however developed diarrhea with bolus feeds at home.  He states she was progressing well with home physical therapy until past Friday (9/25) when she started to sleep more and appeared tired/progressively  worse in terms of mental status.  He denies noticing any black stools but reports "dark" stools.  On 12/20/2018 the patient dropped her hemoglobin from 7.8 to 6.1 with a positive FOBT. She has been evaluated by GI. They did not feel that GI Bleed was the source of her anemia, and they did not recommend any invasive endoscopic procedures. The patient received 2 units of PRBC's in transfusion and was place on a protonix drip which has now been converted to IV BID. Her hemoglobin has stabilized and she has been restarted on her tube feeds.  Consultants   Wound Care  Gastroenterology  Procedures   Transfusion of 2 units PRBC's  Antibiotics   Anti-infectives (From admission, onward)   Start     Dose/Rate Route Frequency Ordered Stop   12/20/18 1800  ampicillin-sulbactam (UNASYN) 1.5 g in sodium chloride 0.9 % 100 mL IVPB     1.5 g 200 mL/hr over 30 Minutes Intravenous Every 6 hours 12/20/18 1720     12/19/18 1800  cefTRIAXone (ROCEPHIN) 1 g in sodium chloride 0.9 % 100 mL IVPB  Status:  Discontinued     1 g 200 mL/hr over 30 Minutes Intravenous Every 24 hours 12/18/18 1929 12/20/18 1719   12/18/18 1715  cefTRIAXone (ROCEPHIN) 1 g in sodium chloride 0.9 % 100 mL IVPB     1 g 200 mL/hr over 30 Minutes Intravenous  Once 12/18/18 1703 12/18/18 1822      Subjective  The patient is resting comfortably. No new complaints.  Objective   Vitals:  Vitals:   12/21/18 1337 12/21/18 1701  BP: 139/68 (!) 127/52  Pulse: 75 84  Resp: 16 17  Temp: 98.3 F (36.8 C) 98 F (36.7 C)  SpO2: 100% 100%  Exam:  Constitutional:   The patient is awake, alert, and oriented x 3. No acute distress. Respiratory:   No increased work of breathing.  No wheezes, rales, or rhonchi  No tactile fremitus Cardiovascular:   Regular rate and rhythm  No murmurs, ectopy, or gallups.  No lateral PMI. No thrills. Abdomen:   Abdomen is soft, non-tender, non-distended  No hernias, masses, or  organomegaly  Normoactive bowel sounds.  Musculoskeletal:   No cyanosis, clubbing, or edema Skin:   No rashes, lesions, ulcers  palpation of skin: no induration or nodules Neurologic:   CN 2-12 intact  Sensation all 4 extremities intact Psychiatric:   Mental status o Mood, affect appropriate o Orientation to person, place, time   judgment and insight appear intact   I have personally reviewed the following:   Today's Data   Vitals, CBC, BMP, H&H   Scheduled Meds:  sodium chloride   Intravenous Once   aspirin  81 mg Per Tube Daily   chlorhexidine  15 mL Mouth Rinse BID   feeding supplement (OSMOLITE 1.2 CAL)  1,000 mL Per Tube Q24H   free water  200 mL Per Tube Q4H   insulin aspart  0-9 Units Subcutaneous Q4H   mouth rinse  15 mL Mouth Rinse q12n4p   metoprolol tartrate  50 mg Per Tube BID   pantoprazole (PROTONIX) IV  40 mg Intravenous Q12H   simvastatin  20 mg Per Tube QHS   Continuous Infusions:  sodium chloride 250 mL (12/21/18 0116)   ampicillin-sulbactam (UNASYN) IV 1.5 g (12/21/18 1501)   dextrose 50 mL/hr at 12/21/18 1713   sodium chloride      Active Problems:   Urinary tract bacterial infections   Pressure injury of skin   LOS: 3 days   A & P  Metabolic encephalopathy: Present on admission.  Likely secondary to hypernatremia, dehydration, uremia and possible UTI in the setting of recent stroke.  Appeaed to be improving slowly.  Hypernatremia: In the setting of diarrhea/dehydration. Could be related to recent stroke as well. Improved from 151-->147 this morning.  Son states he was administering 150 cc of free water via tube feed 4 times a day at home.  Currently on 150 cc free water every 4 hours.  Will increase to 200 cc and continue hypotonic IV fluids with D5W ( cautious given history of diabetes)-increase rate as repeat sodium this afternoon still at 143. BMP every 12 hours.   AKI/uremia: Continue IV hydration as well as via  PEG tube and continue to hold ACE inhibitors.  Disproportionate elevation in BUN could be secondary to GI bleed as well.  Acute on chronic anemia: Fecal occult blood test positive on presentation.  Hemoglobin dropped to 8.2 from last discharge level of 10.8.  Could be secondary to upper GI bleed from PEG site mucosal injury, especially in the setting of multiple antiplatelets/anticoagulants as discussed with GI Dr. Paulita Fujita who evaluated patient today.  Unlikely to benefit from another EGD per GI as recent EGD during PEG placement reported normal esophagus/stomach and also given the risks in the setting of recent stroke. Started on Protonix drip and monitoring closely on IV heparin overnight-Hgb stable around 7.8-8. Seen by GI and PEG site bleeding ruled out. GI recommended starting tube feeds in am (per their note although ordered earlier per d/w son). On 12/20/2018 the patient dropped her hemoglobin from 7.8 to 6.1 with a positive FOBT. She has been evaluated by GI. They did not feel that GI  Bleed was the source of her anemia, and they did not recommend any invasive endoscopic procedures. The patient received 2 units of PRBC's in transfusion and was place on a protonix drip which has now been converted to IV BID. Her hemoglobin has stabilized and she has been restarted on her tube feeds.  Recent embolic CVA: Patient was discharged on Eliquis in addition to aspirin and cilostazol that she was taking.  Could have contributed to acute on chronic anemia. Currently resumed on aspirin and statins.  Eliquis held on admission and concern for GI bleed.  Discussed with son regarding benefits and risks of continuing and holding anticoagulation respectively.  He agreed with resuming heparin and monitoring closely as there is no evidence of gross GI bleeding. So far tolerating okay-Change H&H to daily.   Paroxysmal atrial fibrillation: Now in normal sinus rhythm.  Eliquis held on admission.  Continue metoprolol.  Heparin  without bolus to be initiated per pharmacy consult.  UTI: Patient had abnormal UA with pyuria on presentation.  On IV Rocephin.  Leukocytosis now resolved.  Urine cx reporting pseudomonas/Enterococci-- ?contamination. f/u sensitivities. Will change to Unasyn for now. First dose 12/20/2018.  Dysphagia/dysarthria: In the setting of recent stroke.  Status post PEG tube in previous admission.  Will need speech/nutrition evaluation to advise on optimal diet as per son patient was not tolerating bolus feeds well.    PAD/carotid artery stenosis: Status post carotid endarterectomy.  On aspirin/cilostazol/statins at baseline  Diabetes mellitus: Patient on metformin at home.  Currently on dextrose fluid in concern for hypernatremia.  Watch blood glucose closely, continue sliding scale insulin.  Added Lantus for additional coverage while on dextrose fluids-so far BG okay-titrate as needed. Patient has been hypoglycemic today. I have changed her fluid to D10 and held her lantus. She is still requiring correctional insulin.  Pressure ulcer: pink foam patch in place. Wound care consult.  I have seen and examined this patient myself. I have spent 34 minutes in her evaluation and care.  DVT prophylaxis: SCDs Code Status: DNR Family / Patient Communication: Discussed with son bedside Disposition Plan: SNF   Sandy Haye, DO [facility] Direct contact: see [email or web address]  7PM-7AM contact night coverage as above 12/21/2018, 6:00 PM  LOS: 3 days

## 2018-12-21 NOTE — Progress Notes (Signed)
Shift event: Pt has been watched concerning anemia since admission. She was on Eliquis at home due to recent stroke. Upon admission, she had a + fecal occult, but has not had any overt bleeding. Hgb stable in the 7s for the past 24 hours or so. Tonight, a Hgb resulted at 6.7. Given the drop and stability of Hgb for past day coupled with the fact she has no overt bleeding, NP doubted result. R/p Hgb though, came back at 4.1. NP to bedside. Her BP dropped to the 80s, last one 100. She is not tachycardic. Abdomen is soft, non distended,  and NT. Still no active bleeding. GI saw her today and found no bleeding from PEG/stomach contents. GI recommended no procedure due to pt's debility. It would appear that she definitely has blood loss anemia. We are bolusing her now for the BP and 3 units of PRBCs has been ordered. Heparin drip stopped. Pt has poor venous access. Protonix on hold to give blood until IV team can come and try to get 2nd access. Restart Protonix as soon as 2nd IV is obtained. She is a DNR, so not sure how aggressive family will want to be with tests. Not sure she is even a candidate for testing given her debility. Transfer to progressive care for closer monitoring. Will leave further w/up to GI/attending in am.  Patrica Duel, NP [facility]

## 2018-12-21 NOTE — Care Management Important Message (Signed)
Important Message  Patient Details  Name: [name] MRN: [id number] Date of Birth: Jun 17, 1940   Medicare Important Message Given:  Yes     Orbie Pyo 12/21/2018, 3:21 PM

## 2018-12-22 LAB — HEMOGLOBIN AND HEMATOCRIT, BLOOD
HCT: 13.4 % — ABNORMAL LOW (ref 36.0–46.0)
Hemoglobin: 4.1 g/dL — CL (ref 12.0–15.0)

## 2018-12-22 LAB — GLUCOSE, CAPILLARY
Glucose-Capillary: 108 mg/dL — ABNORMAL HIGH (ref 70–99)
Glucose-Capillary: 111 mg/dL — ABNORMAL HIGH (ref 70–99)
Glucose-Capillary: 131 mg/dL — ABNORMAL HIGH (ref 70–99)
Glucose-Capillary: 182 mg/dL — ABNORMAL HIGH (ref 70–99)
Glucose-Capillary: 197 mg/dL — ABNORMAL HIGH (ref 70–99)
Glucose-Capillary: 225 mg/dL — ABNORMAL HIGH (ref 70–99)
Glucose-Capillary: 54 mg/dL — ABNORMAL LOW (ref 70–99)

## 2018-12-22 LAB — APTT: aPTT: 28 seconds (ref 24–36)

## 2018-12-22 MED ORDER — DEXTROSE 50 % IV SOLN
INTRAVENOUS | Status: AC
  Administered 2018-12-22: 04:00:00 50 mL
  Filled 2018-12-22: qty 50

## 2018-12-22 MED ORDER — LOPERAMIDE HCL 2 MG PO CAPS
2.0000 mg | ORAL_CAPSULE | Freq: Once | ORAL | Status: AC
  Administered 2018-12-22: 2 mg via ORAL
  Filled 2018-12-22: qty 1

## 2018-12-22 MED ORDER — OSMOLITE 1.2 CAL PO LIQD
1000.0000 mL | ORAL | Status: DC
  Administered 2018-12-24 – 2018-12-26 (×3): 1000 mL
  Filled 2018-12-22 (×9): qty 1000

## 2018-12-22 NOTE — Progress Notes (Addendum)
Hypoglycemic Event  CBG: 54  Treatment:50 ml D50 IV (pt NPO on tube feeds)  Symptoms:none  Follow-up CBG: Time:448 CBG Result:182  Possible Reasons for Event: tube feeds  Comments/MD notified:provider notified    Lajoyce Corners

## 2018-12-22 NOTE — Progress Notes (Signed)
[facility] Gastroenterology Progress Note  [name] 78 y.o. February 27, 1941   Subjective: Lying awake in bed. Nonverbal. Large brown stool reported in chart.  Objective: Vital signs: Vitals:   12/22/18 0348 12/22/18 0423  BP:  (!) 158/69  Pulse:  90  Resp:    Temp: 98.7 F (37.1 C)   SpO2:  100%    Physical Exam: Gen: lethargic, cachetic, elderly, no acute distress  HEENT: anicteric sclera CV: RRR Chest: CTA B Abd: soft, nontender, nondistended, PEG tube with binder in place Ext: no edema  Lab Results: Recent Labs    12/20/18 2122 12/21/18 1618  NA 142 143  K 3.1* 3.5  CL 114* 117*  CO2 17* 17*  GLUCOSE 182* 242*  BUN 28* 15  CREATININE 0.97 0.94  CALCIUM 7.9* 7.9*   No results for input(s): AST, ALT, ALKPHOS, BILITOT, PROT, ALBUMIN in the last 72 hours. Recent Labs    12/21/18 1618 12/21/18 2150  WBC 8.0 8.5  HGB 10.4* 10.1*  HCT 31.9* 30.7*  MCV 85.3 83.4  PLT 308 307      Assessment/Plan: Anemia likely multifactorial but no signs of current GI bleeding. Manage conservatively. No plans for endoscopic procedures. Receiving nutrition through PEG tube. Will sign off. Dr. Penelope Coop available to see this weekend if needed.   [name] 12/22/2018, 8:50 AM  Questions please call [phone] ID: [name], female   DOB: 29-Dec-1940, 78 y.o.   MRN: [id number]

## 2018-12-22 NOTE — Progress Notes (Signed)
Inpatient Diabetes Program Recommendations  AACE/ADA: New Consensus Statement on Inpatient Glycemic Control (2015)  Target Ranges:  Prepandial:   less than 140 mg/dL      Peak postprandial:   less than 180 mg/dL (1-2 hours)      Critically ill patients:  140 - 180 mg/dL   Lab Results  Component Value Date   GLUCAP 108 (H) 12/22/2018   HGBA1C 6.6 (H) 11/21/2018    Review of Glycemic Control Results for DOLLINDA, DICELLO (MRN [id number]) as of 12/22/2018 11:29  Ref. Range 12/22/2018 00:27 12/22/2018 03:55 12/22/2018 03:58 12/22/2018 04:52 12/22/2018 08:30  Glucose-Capillary Latest Ref Range: 70 - 99 mg/dL 131 (H) 54 (L) 27 (LL) 182 (H) 108 (H)   Diabetes history: Type 2 DM Outpatient Diabetes medications: Metformin 850 mg BID Current orders for Inpatient glycemic control: Novolog 0-9 units Q4H Levemir 8 units x 1 on 10/1 Osmolite 1.2 Cal increased to 55 ml/hr  Inpatient Diabetes Program Recommendations:    Noted severe hypoglycemia of 27 mg/dL following 1 units of Novolog. Noted rate of tube feeds have been increased.   At this time, would consider a custom correction scale Q4H.   Novolog Custom Correction Scale CBG <70 - implement hypoglycemia protocol   70-120- 0 121-150 - 0 151-200 - 0 201-250 - 1 251-300 - 3 301-350 - 4 351-400 - 5 >400- call MD  Thanks, Bronson Curb, MSN, RNC-OB Diabetes Coordinator [phone] (8a-5p)

## 2018-12-22 NOTE — Progress Notes (Addendum)
PROGRESS NOTE  [name] [id number] DOB: 1940/07/18 DOA: 12/18/2018 PCP: Eloy End, MD  Brief History   78 y.o.femalewith medical history significant ofCAD, bilateral carotid artery stenosis status post right carotid enterectomy, myocardial infarction, hypertension, mitral valve insufficiency, atrial fibrillation on Eliquis, recent embolic stroke involving the right MCA status post TPA and unsuccessful IR brought in by son in concern for altered mental status and increasing weakness.  Patient was recently admitted from 8/31 to 9/12 with mechanical ventilation secondary toacutehypoxiarespiratory failure with findings ofembolic stroke involving the right MCA status post TPA and unsuccessful IR,and ultimately requiring PEG tube placement.Since returning home, patient has had residual left-sided weakness and slurred speech at baseline. Son reports that he was still talking to her and getting her up out of bed daily. However, about 2 days ago she became more nonverbal and was sleeping a lot more and had 2 episodes of diarrheaprompting him to bring her in for further evaluation. ED Course: The patient had mildly tachycardic up to 103.CBC shows mildly elevated WBC of 12.2 and a hemoglobin of 8.2 which is decreased from 10.8 two weeks ago. Fecal occult blood was also positive. CMP showed elevated sodium of 150 and increase creatinine of 1.20 and elevated BUN of 89. Urinalysis was positive for large leukocytes and negative nitrites with few bacteria, WBC and hyaline casts. CTA Angie of the head showed evolving large right MCA territory infarct from prior but no new infarct identified. Per d/w son: He states that patient tolerated continuous tube feeds well during prior hospitalization.  She however developed diarrhea with bolus feeds at home.  He states she was progressing well with home physical therapy until past Friday (9/25) when she started to sleep more and appeared  tired/progressively worse in terms of mental status.  He denies noticing any black stools but reports "dark" stools.  On 12/20/2018 the patient dropped her hemoglobin from 7.8 to 6.1 with a positive FOBT. She has been evaluated by GI. They did not feel that GI Bleed was the source of her anemia, and they did not recommend any invasive endoscopic procedures. The patient received 2 units of PRBC's in transfusion and was place on a protonix drip which has now been converted to IV BID. Her hemoglobin has stabilized and she has been restarted on her tube feeds.  The patient has had one episode of hypoglycemia. Will monitor glucoses carefully over the next 24 hours.  Consultants   Wound Care  Gastroenterology  Procedures   Transfusion of 2 units PRBC's  Antibiotics   Anti-infectives (From admission, onward)   Start     Dose/Rate Route Frequency Ordered Stop   12/20/18 1800  ampicillin-sulbactam (UNASYN) 1.5 g in sodium chloride 0.9 % 100 mL IVPB     1.5 g 200 mL/hr over 30 Minutes Intravenous Every 6 hours 12/20/18 1720     12/19/18 1800  cefTRIAXone (ROCEPHIN) 1 g in sodium chloride 0.9 % 100 mL IVPB  Status:  Discontinued     1 g 200 mL/hr over 30 Minutes Intravenous Every 24 hours 12/18/18 1929 12/20/18 1719   12/18/18 1715  cefTRIAXone (ROCEPHIN) 1 g in sodium chloride 0.9 % 100 mL IVPB     1 g 200 mL/hr over 30 Minutes Intravenous  Once 12/18/18 1703 12/18/18 1822     Subjective  The patient is resting comfortably. No new complaints.  Objective   Vitals:  Vitals:   12/22/18 0348 12/22/18 0423  BP:  (!) 158/69  Pulse:  90  Resp:    Temp: 98.7 F (37.1 C)   SpO2:  100%   Exam:  Constitutional:   The patient is awake, alert, and oriented x 3. No acute distress. Respiratory:   No increased work of breathing.  No wheezes, rales, or rhonchi  No tactile fremitus Cardiovascular:   Regular rate and rhythm  No murmurs, ectopy, or gallups.  No lateral PMI. No  thrills. Abdomen:   Abdomen is soft, non-tender, non-distended  No hernias, masses, or organomegaly  Normoactive bowel sounds.  Musculoskeletal:   No cyanosis, clubbing, or edema Skin:   No rashes, lesions, ulcers  palpation of skin: no induration or nodules Neurologic:   CN 2-12 intact  Sensation all 4 extremities intact Psychiatric:   Mental status o Mood, affect appropriate o Orientation to person, place, time   judgment and insight appear intact   I have personally reviewed the following:   Today's Data   Vitals, CBC, BMP, H&H   Scheduled Meds:  sodium chloride   Intravenous Once   aspirin  81 mg Per Tube Daily   chlorhexidine  15 mL Mouth Rinse BID   free water  200 mL Per Tube Q4H   insulin aspart  0-9 Units Subcutaneous Q4H   mouth rinse  15 mL Mouth Rinse q12n4p   metoprolol tartrate  50 mg Per Tube BID   pantoprazole (PROTONIX) IV  40 mg Intravenous Q12H   simvastatin  20 mg Per Tube QHS   Continuous Infusions:  sodium chloride 250 mL (12/21/18 0116)   ampicillin-sulbactam (UNASYN) IV 1.5 g (12/22/18 1500)   dextrose 50 mL/hr at 12/22/18 0342   feeding supplement (OSMOLITE 1.2 CAL) 55 mL/hr at 12/22/18 0955   sodium chloride      Active Problems:   Urinary tract bacterial infections   Pressure injury of skin   LOS: 4 days   A & P  Metabolic encephalopathy: Present on admission.  Likely secondary to hypernatremia, dehydration, uremia and possible UTI in the setting of recent stroke.  Appeaed to be improving slowly.  Hypernatremia: In the setting of diarrhea/dehydration. Could be related to recent stroke as well. Improved from 151-->143 morning of 12/21/2018.  Son states he was administering 150 cc of free water via tube feed 4 times a day at home.  Currently on 150 cc free water every 4 hours.  Will increase to 200 cc and continue hypotonic IV fluids with D5W ( cautious given history of diabetes)-increase rate as repeat sodium  this afternoon still at 143. BMP every 12 hours.   AKI/uremia: Continue IV hydration as well as via PEG tube and continue to hold ACE inhibitors.  Disproportionate elevation in BUN could be secondary to GI bleed as well.  Acute on chronic anemia: Fecal occult blood test positive on presentation.  Hemoglobin dropped to 8.2 from last discharge level of 10.8.  Could be secondary to upper GI bleed from PEG site mucosal injury, especially in the setting of multiple antiplatelets/anticoagulants as discussed with GI Dr. Paulita Fujita who evaluated patient today.  Unlikely to benefit from another EGD per GI as recent EGD during PEG placement reported normal esophagus/stomach and also given the risks in the setting of recent stroke. Started on Protonix drip and monitoring closely on IV heparin overnight-Hgb stable around 7.8-8. Seen by GI and PEG site bleeding ruled out. GI recommended starting tube feeds in am (per their note although ordered earlier per d/w son). On 12/20/2018 the patient dropped her hemoglobin from 7.8  to 6.1 with a positive FOBT. She has been evaluated by GI. They did not feel that GI Bleed was the source of her anemia, and they did not recommend any invasive endoscopic procedures. The patient received 2 units of PRBC's in transfusion and was place on a protonix drip which has now been converted to IV BID. Her hemoglobin has stabilized and she has been restarted on her tube feeds. Hemoglobin is stable following transfusion of 2 units yesterday.  Recent embolic CVA: Patient was discharged on Eliquis in addition to aspirin and cilostazol that she was taking.  Could have contributed to acute on chronic anemia. Currently resumed on aspirin and statins.  Eliquis held on admission and concern for GI bleed.  Discussed with son regarding benefits and risks of continuing and holding anticoagulation respectively.  He agreed with resuming heparin and monitoring closely as there is no evidence of gross GI bleeding.  So far tolerating okay.    Paroxysmal atrial fibrillation: Now in normal sinus rhythm.  Eliquis held on admission.  Continue metoprolol.  Heparin without bolus to be initiated per pharmacy consult.  UTI: Patient had abnormal UA with pyuria on presentation.  On IV Rocephin.  Leukocytosis now resolved.  Urine cx reporting pseudomonas/Enterococci-- ?contamination. f/u sensitivities. Will change to Unasyn for now. First dose 12/20/2018.  Dysphagia/dysarthria: In the setting of recent stroke.  Status post PEG tube in previous admission.  Will need speech/nutrition evaluation to advise on optimal diet as per son patient was not tolerating bolus feeds well.    PAD/carotid artery stenosis: Status post carotid endarterectomy.  On aspirin/cilostazol/statins at baseline  Diabetes mellitus: Patient on metformin at home.  Currently on dextrose fluid in concern for hypernatremia.  Watch blood glucose closely, continue sliding scale insulin.  Added Lantus for additional coverage while on dextrose fluids-so far BG okay-titrate as needed. Patient has been hypoglycemic today. I have changed her fluid to D10 and held her lantus. She is still requiring correctional insulin.  Pressure ulcer: pink foam patch in place. Wound care consult.  I have seen and examined this patient myself. I have spent 32 minutes in her evaluation and care.  DVT prophylaxis: SCDs Code Status: DNR Family / Patient Communication: Discussed with son bedside Disposition Plan: SNF   Toniann Dickerson, DO [facility] Direct contact: see [email or web address]  7PM-7AM contact night coverage as above 12/22/2018, 4:08 PM  LOS: 3 days

## 2018-12-22 NOTE — Progress Notes (Signed)
Nutrition Follow-up  DOCUMENTATION CODES:   Severe malnutrition in context of chronic illness, Underweight  INTERVENTION:   -Continue Osmolite 1.2 @ 55 ml/hr  200 ml free water flush 4 times daily  Tube feeding regimen provides 1584kcal (100% of needs), 73 grams of protein, and 1882 ml of H2O.   NUTRITION DIAGNOSIS:   Severe Malnutrition related to chronic illness(CVA) as evidenced by severe fat depletion, severe muscle depletion.  Ongoing  GOAL:   Patient will meet greater than or equal to 90% of their needs  Met with TF  MONITOR:   Labs, Weight trends, TF tolerance, Skin, I & O's  REASON FOR ASSESSMENT:   Consult Enteral/tube feeding initiation and management  ASSESSMENT:   [name] is a 78 y.o. female with medical history significant of CAD, bilateral carotid artery stenosis status post right carotid enterectomy, myocardial infarction, hypertension, mitral valve insufficiency, atrial fibrillation on Eliquis, recent embolic stroke involving the right MCA status post TPA and unsuccessful IR who presented with altered mental status and increasing weakness.  Son at bedside provides history as patient has incoherent speech.  Patient was recently admitted from 8/31 to 9/12 with mechanical ventilation secondary to acute hypoxia respiratory failure with findings of embolic stroke involving the right MCA status post TPA and unsuccessful IR, and ultimately requiring PEG tube placement.  Since returning home, patient has had residual left-sided weakness and slurred speech at baseline.  Son reports that he was still talking to her and getting her up out of bed daily.  However, about 2 days ago she became more nonverbal and was sleeping a lot more and had 2 episodes of diarrhea prompting him to bring her into the emergency department today for further evaluation  Reviewed I/O's: +2.9 L x 24 hours and +5.1 L since admission  UOP: 50 ml/hr  GI signed off today. No plans for any  procedures. No signs of GIB and suspect anemia is multiple factorial.   TF restarted yesterday evening. Per MAR, pt now receiving Osmolite 1.2 @ 55 ml/hr (goal rate).   Medications reviewed and include dextrose 10% infusion @ 50 ml/hr.   Labs reviewed: CBGS: 54-182 (inpatient orders for glycemic control are 0-9 units insulin aspart every 4 hours).   Diet Order:   Diet Order            Diet NPO time specified  Diet effective now              EDUCATION NEEDS:   No education needs have been identified at this time  Skin:  Skin Assessment: Skin Integrity Issues: Skin Integrity Issues:: Stage II Stage II: sacrum  Last BM:  12/22/18  Height:   Ht Readings from Last 1 Encounters:  12/22/18 '5\' 1"'  (1.549 m)    Weight:   Wt Readings from Last 1 Encounters:  12/22/18 42.9 kg    Ideal Body Weight:  47.7 kg  BMI:  Body mass index is 17.87 kg/m.  Estimated Nutritional Needs:   Kcal:  1400-1600  Protein:  65-80 grams  Fluid:  > 1.4 L    Welcome Fults A. Jimmye Norman, RD, LDN, [place] Registered Dietitian II Certified Diabetes Care and Education Specialist Pager: [phone] After hours Pager: [phone]

## 2018-12-23 LAB — CBC WITH DIFFERENTIAL/PLATELET
Abs Immature Granulocytes: 0.09 10*3/uL — ABNORMAL HIGH (ref 0.00–0.07)
Basophils Absolute: 0 10*3/uL (ref 0.0–0.1)
Basophils Relative: 0 %
Eosinophils Absolute: 0.1 10*3/uL (ref 0.0–0.5)
Eosinophils Relative: 2 %
HCT: 32.2 % — ABNORMAL LOW (ref 36.0–46.0)
Hemoglobin: 9.9 g/dL — ABNORMAL LOW (ref 12.0–15.0)
Immature Granulocytes: 1 %
Lymphocytes Relative: 17 %
Lymphs Abs: 1.3 10*3/uL (ref 0.7–4.0)
MCH: 26.5 pg (ref 26.0–34.0)
MCHC: 30.7 g/dL (ref 30.0–36.0)
MCV: 86.1 fL (ref 80.0–100.0)
Monocytes Absolute: 0.6 10*3/uL (ref 0.1–1.0)
Monocytes Relative: 8 %
Neutro Abs: 5.7 10*3/uL (ref 1.7–7.7)
Neutrophils Relative %: 72 %
Platelets: 227 10*3/uL (ref 150–400)
RBC: 3.74 MIL/uL — ABNORMAL LOW (ref 3.87–5.11)
RDW: 17.6 % — ABNORMAL HIGH (ref 11.5–15.5)
WBC: 7.8 10*3/uL (ref 4.0–10.5)
nRBC: 0.4 % — ABNORMAL HIGH (ref 0.0–0.2)

## 2018-12-23 LAB — GLUCOSE, CAPILLARY
Glucose-Capillary: 145 mg/dL — ABNORMAL HIGH (ref 70–99)
Glucose-Capillary: 146 mg/dL — ABNORMAL HIGH (ref 70–99)
Glucose-Capillary: 172 mg/dL — ABNORMAL HIGH (ref 70–99)
Glucose-Capillary: 199 mg/dL — ABNORMAL HIGH (ref 70–99)
Glucose-Capillary: 252 mg/dL — ABNORMAL HIGH (ref 70–99)

## 2018-12-23 LAB — BASIC METABOLIC PANEL
Anion gap: 6 (ref 5–15)
BUN: 12 mg/dL (ref 8–23)
CO2: 17 mmol/L — ABNORMAL LOW (ref 22–32)
Calcium: 7.7 mg/dL — ABNORMAL LOW (ref 8.9–10.3)
Chloride: 118 mmol/L — ABNORMAL HIGH (ref 98–111)
Creatinine, Ser: 0.85 mg/dL (ref 0.44–1.00)
GFR calc Af Amer: >60 mL/min (ref >60)
GFR calc non Af Amer: >60 mL/min (ref >60)
Glucose, Bld: 191 mg/dL — ABNORMAL HIGH (ref 70–99)
Potassium: 3.5 mmol/L (ref 3.5–5.1)
Sodium: 141 mmol/L (ref 135–145)

## 2018-12-23 LAB — APTT: aPTT: 20 seconds — ABNORMAL LOW (ref 24–36)

## 2018-12-23 MED ORDER — APIXABAN 5 MG PO TABS
5.0000 mg | ORAL_TABLET | Freq: Two times a day (BID) | ORAL | Status: DC
  Administered 2018-12-23 – 2018-12-26 (×7): 5 mg
  Filled 2018-12-23 (×6): qty 1

## 2018-12-23 MED ORDER — DICYCLOMINE HCL 10 MG PO CAPS
10.0000 mg | ORAL_CAPSULE | Freq: Four times a day (QID) | ORAL | Status: DC | PRN
  Filled 2018-12-23: qty 1

## 2018-12-23 NOTE — Progress Notes (Signed)
PROGRESS NOTE  [name] [id number] DOB: 07-12-1940 DOA: 12/18/2018 PCP: Eloy End, MD  Brief History   78 y.o.femalewith medical history significant ofCAD, bilateral carotid artery stenosis status post right carotid enterectomy, myocardial infarction, hypertension, mitral valve insufficiency, atrial fibrillation on Eliquis, recent embolic stroke involving the right MCA status post TPA and unsuccessful IR brought in by son in concern for altered mental status and increasing weakness.  Patient was recently admitted from 8/31 to 9/12 with mechanical ventilation secondary toacutehypoxiarespiratory failure with findings ofembolic stroke involving the right MCA status post TPA and unsuccessful IR,and ultimately requiring PEG tube placement.Since returning home, patient has had residual left-sided weakness and slurred speech at baseline. Son reports that he was still talking to her and getting her up out of bed daily. However, about 2 days ago she became more nonverbal and was sleeping a lot more and had 2 episodes of diarrheaprompting him to bring her in for further evaluation. ED Course: The patient had mildly tachycardic up to 103.CBC shows mildly elevated WBC of 12.2 and a hemoglobin of 8.2 which is decreased from 10.8 two weeks ago. Fecal occult blood was also positive. CMP showed elevated sodium of 150 and increase creatinine of 1.20 and elevated BUN of 89. Urinalysis was positive for large leukocytes and negative nitrites with few bacteria, WBC and hyaline casts. CTA Angie of the head showed evolving large right MCA territory infarct from prior but no new infarct identified. Per d/w son: He states that patient tolerated continuous tube feeds well during prior hospitalization.  She however developed diarrhea with bolus feeds at home.  He states she was progressing well with home physical therapy until past Friday (9/25) when she started to sleep more and appeared  tired/progressively worse in terms of mental status.  He denies noticing any black stools but reports "dark" stools.  On 12/20/2018 the patient dropped her hemoglobin from 7.8 to 6.1 with a positive FOBT. She has been evaluated by GI. They did not feel that GI Bleed was the source of her anemia, and they did not recommend any invasive endoscopic procedures. The patient received 2 units of PRBC's in transfusion and was place on a protonix drip which has now been converted to IV BID. Her hemoglobin has stabilized and she has been restarted on her tube feeds.  The patient has had one episode of hypoglycemia. Will monitor glucoses carefully over the next 24 hours. Eliquis restarted on 12/23/2018. Monitor hemoglobin.  Consultants   Wound Care  Gastroenterology  Procedures   Transfusion of 2 units PRBC's  Antibiotics   Anti-infectives (From admission, onward)   Start     Dose/Rate Route Frequency Ordered Stop   12/20/18 1800  ampicillin-sulbactam (UNASYN) 1.5 g in sodium chloride 0.9 % 100 mL IVPB     1.5 g 200 mL/hr over 30 Minutes Intravenous Every 6 hours 12/20/18 1720     12/19/18 1800  cefTRIAXone (ROCEPHIN) 1 g in sodium chloride 0.9 % 100 mL IVPB  Status:  Discontinued     1 g 200 mL/hr over 30 Minutes Intravenous Every 24 hours 12/18/18 1929 12/20/18 1719   12/18/18 1715  cefTRIAXone (ROCEPHIN) 1 g in sodium chloride 0.9 % 100 mL IVPB     1 g 200 mL/hr over 30 Minutes Intravenous  Once 12/18/18 1703 12/18/18 1822     Subjective  The patient is resting comfortably. The patient is complaining of abdominal cramping.  Objective   Vitals:  Vitals:   12/23/18 0043  12/23/18 0334  BP: (!) 149/56   Pulse: 83   Resp:    Temp: 98.3 F (36.8 C) 98.7 F (37.1 C)  SpO2: 96%    Exam:  Constitutional:   The patient is awake, alert, and oriented x 3. No acute distress. Respiratory:   No increased work of breathing.  No wheezes, rales, or rhonchi  No tactile  fremitus Cardiovascular:   Regular rate and rhythm  No murmurs, ectopy, or gallups.  No lateral PMI. No thrills. Abdomen:   Abdomen is soft, non-tender, non-distended  No hernias, masses, or organomegaly  Normoactive bowel sounds.  Musculoskeletal:   No cyanosis, clubbing, or edema Skin:   No rashes, lesions, ulcers  palpation of skin: no induration or nodules Neurologic:   CN 2-12 intact  Sensation all 4 extremities intact Psychiatric:   Mental status o Mood, affect appropriate o Orientation to person, place, time   judgment and insight appear intact  I have personally reviewed the following:   Today's Data   Vitals, CBC, BMP  Scheduled Meds:  sodium chloride   Intravenous Once   apixaban  5 mg Per Tube BID   aspirin  81 mg Per Tube Daily   chlorhexidine  15 mL Mouth Rinse BID   free water  200 mL Per Tube Q4H   insulin aspart  0-9 Units Subcutaneous Q4H   mouth rinse  15 mL Mouth Rinse q12n4p   metoprolol tartrate  50 mg Per Tube BID   pantoprazole (PROTONIX) IV  40 mg Intravenous Q12H   simvastatin  20 mg Per Tube QHS   Continuous Infusions:  sodium chloride 250 mL (12/21/18 0116)   ampicillin-sulbactam (UNASYN) IV 1.5 g (12/23/18 0945)   feeding supplement (OSMOLITE 1.2 CAL) 55 mL/hr at 12/22/18 0955   sodium chloride      Active Problems:   Urinary tract bacterial infections   Pressure injury of skin   LOS: 5 days   A & P  Metabolic encephalopathy: Present on admission.  Likely secondary to hypernatremia, dehydration, uremia and possible UTI in the setting of recent stroke.  Appears to be improving.  Hypernatremia: In the setting of diarrhea/dehydration. Could be related to recent stroke as well. Improved from 151-->141 morning of 12/23/2018.  Son states he was administering 150 cc of free water via tube feed 4 times a day at home.  Currently on 150 cc free water every 4 hours.  Will increase to 200 cc and continue hypotonic IV  fluids with D5W ( cautious given history of diabetes)-increase rate as repeat sodium this afternoon still at 141. BMP every 12 hours.   AKI/uremia: Continue IV hydration as well as via PEG tube and continue to hold ACE inhibitors.  Disproportionate elevation in BUN could be secondary to GI bleed as well.  Acute on chronic anemia: Fecal occult blood test positive on presentation.  Hemoglobin dropped to 8.2 from last discharge level of 10.8.  Could be secondary to upper GI bleed from PEG site mucosal injury, especially in the setting of multiple antiplatelets/anticoagulants as discussed with GI Dr. Paulita Fujita who evaluated patient today.  Unlikely to benefit from another EGD per GI as recent EGD during PEG placement reported normal esophagus/stomach and also given the risks in the setting of recent stroke. Started on Protonix drip and monitoring closely on IV heparin overnight-Hgb stable around 7.8-8. Seen by GI and PEG site bleeding ruled out. GI recommended starting tube feeds in am (per their note although ordered earlier per d/w  son). On 12/20/2018 the patient dropped her hemoglobin from 7.8 to 6.1 with a positive FOBT. She has been evaluated by GI. They did not feel that GI Bleed was the source of her anemia, and they did not recommend any invasive endoscopic procedures. The patient received 2 units of PRBC's in transfusion and was place on a protonix drip which has now been converted to IV BID. Her hemoglobin has stabilized and she has been restarted on her tube feeds. Hemoglobin is stable following transfusion of 2 units yesterday. Apixaban has been restarted. Mnitor hemoglobin for the next 24 hours.  Recent embolic CVA: Patient was discharged on Eliquis in addition to aspirin and cilostazol that she was taking.  Could have contributed to acute on chronic anemia. Currently resumed on aspirin and statins.  Eliquis held on admission and concern for GI bleed.  Discussed with son regarding benefits and risks of  continuing and holding anticoagulation respectively.  He agreed with resuming heparin and monitoring closely as there is no evidence of gross GI bleeding. So far tolerating okay.    Paroxysmal atrial fibrillation: Now in normal sinus rhythm.  Eliquis held on admission.  Continue metoprolol.  Heparin without bolus to be initiated per pharmacy consult.  UTI: Patient had abnormal UA with pyuria on presentation.  On IV Rocephin.  Leukocytosis now resolved.  Urine cx reporting pseudomonas/Enterococci-- ?contamination. f/u sensitivities. Will change to Unasyn for now. First dose 12/20/2018.  Dysphagia/dysarthria: In the setting of recent stroke.  Status post PEG tube in previous admission.  Will need speech/nutrition evaluation to advise on optimal diet as per son patient was not tolerating bolus feeds well.    PAD/carotid artery stenosis: Status post carotid endarterectomy.  On aspirin/cilostazol/statins at baseline  Diabetes mellitus: Patient on metformin at home.  Currently on dextrose fluid in concern for hypernatremia.  Watch blood glucose closely, continue sliding scale insulin.  Added Lantus for additional coverage while on dextrose fluids-so far BG okay-titrate as needed. Patient has been hypoglycemic today. I have changed her fluid to D10 and held her lantus. She is still requiring correctional insulin.  Pressure ulcer: pink foam patch in place. Wound care consult.  I have seen and examined this patient myself. I have spent 34 minutes in her evaluation and care.  DVT prophylaxis: SCDs Code Status: DNR Family / Patient Communication: Discussed with son bedside Disposition Plan: SNF   Alison Arrick, DO [facility] Direct contact: see [email or web address]  7PM-7AM contact night coverage as above 12/23/2018, 2:08 PM  LOS: 3 days

## 2018-12-23 NOTE — Progress Notes (Signed)
ANTICOAGULATION CONSULT NOTE - Initial Consult  Pharmacy Consult for apixaban Indication: atrial fibrillation  No Known Allergies  Patient Measurements: Height: 5\' 1"  (154.9 cm) Weight: 94 lb 12.8 oz (43 kg) IBW/kg (Calculated) : 47.8  Vital Signs: Temp: 98.7 F (37.1 C) (10/03 0334) Temp Source: Oral (10/03 0334) BP: 149/56 (10/03 0043) Pulse Rate: 83 (10/03 0043)  Labs: Recent Labs    12/20/18 2122  12/21/18 0335  12/21/18 1618 12/21/18 2150 12/22/18 0251 12/23/18 0316  HGB 6.7*   < >  --    < > 10.4* 10.1*  --  9.9*  HCT 22.1*   < >  --    < > 31.9* 30.7*  --  32.2*  PLT  --   --   --    < > 308 307  --  227  APTT 40*  --  52*  --   --   --  28 20*  HEPARINUNFRC  --   --  0.70  --   --   --   --   --   CREATININE 0.97  --   --   --  0.94  --   --  0.85   < > = values in this interval not displayed.    Estimated Creatinine Clearance: 38.2 mL/min (by C-G formula based on SCr of 0.85 mg/dL).   Medical History: Past Medical History:  Diagnosis Date  . Arthritis   . CAD (coronary artery disease)   . Carotid artery occlusion   . Diabetes mellitus   . Hyperlipidemia   . Hypertension   . Myocardial infarction ([place])   . Stroke [facility])     Medications:  Scheduled:  . sodium chloride   Intravenous Once  . apixaban  5 mg Per Tube BID  . aspirin  81 mg Per Tube Daily  . chlorhexidine  15 mL Mouth Rinse BID  . free water  200 mL Per Tube Q4H  . insulin aspart  0-9 Units Subcutaneous Q4H  . mouth rinse  15 mL Mouth Rinse q12n4p  . metoprolol tartrate  50 mg Per Tube BID  . pantoprazole (PROTONIX) IV  40 mg Intravenous Q12H  . simvastatin  20 mg Per Tube QHS   Infusions:  . sodium chloride 250 mL (12/21/18 0116)  . ampicillin-sulbactam (UNASYN) IV 1.5 g (12/23/18 0945)  . feeding supplement (OSMOLITE 1.2 CAL) 55 mL/hr at 12/22/18 0955  . sodium chloride      Assessment: 78 yr old female admitted on 12/18/18 for altered mental status and weakness. Pt had  recent embolic stroke involving right MCA, S/P TPA. Pt has hx of a fib and was taking apixaban 5 mg po BID PTA (last dose was at 0700 on 9/28, per med rec), along with Plavix and ASA   Fecal occult blood was positive in ED, and H/H in ED decreased from 2 wks ago, so anticoagulation initially held during this admission. Pharmacy now consulted to dose apixaban in this pt with recent embolic stroke and bleeding. GI states that no signs of current GI bleeding and no plans for endoscopic procedures. Hg has stabilized at 9.9  Goal of Therapy:  Monitor platelets by anticoagulation protocol: Yes   Plan:  - Restart apixaban at 5 mg BID (same as PTA dose). While the patient has a history of bleeding, this dose is appropriate based on patient's weight, Scr, and age. No dose reduction is necessary at this point. Given her history of MCA stroke, the risk  of cardioembolic stroke outweighs risk of bleeding. - Monitor CBC and Scr periodically - Watch for signs/symptoms of bleeding  Sherren Kerns, PharmD PGY1 Acute Care Pharmacy Resident 12/23/2018,10:53 AM

## 2018-12-24 LAB — GLUCOSE, CAPILLARY
Glucose-Capillary: 145 mg/dL — ABNORMAL HIGH (ref 70–99)
Glucose-Capillary: 154 mg/dL — ABNORMAL HIGH (ref 70–99)
Glucose-Capillary: 181 mg/dL — ABNORMAL HIGH (ref 70–99)
Glucose-Capillary: 183 mg/dL — ABNORMAL HIGH (ref 70–99)
Glucose-Capillary: 208 mg/dL — ABNORMAL HIGH (ref 70–99)
Glucose-Capillary: 215 mg/dL — ABNORMAL HIGH (ref 70–99)

## 2018-12-24 LAB — CBC WITH DIFFERENTIAL/PLATELET
Abs Immature Granulocytes: 0.07 10*3/uL (ref 0.00–0.07)
Basophils Absolute: 0 10*3/uL (ref 0.0–0.1)
Basophils Relative: 0 %
Eosinophils Absolute: 0.1 10*3/uL (ref 0.0–0.5)
Eosinophils Relative: 2 %
HCT: 29.5 % — ABNORMAL LOW (ref 36.0–46.0)
Hemoglobin: 9.4 g/dL — ABNORMAL LOW (ref 12.0–15.0)
Immature Granulocytes: 1 %
Lymphocytes Relative: 18 %
Lymphs Abs: 1.5 10*3/uL (ref 0.7–4.0)
MCH: 27.3 pg (ref 26.0–34.0)
MCHC: 31.9 g/dL (ref 30.0–36.0)
MCV: 85.8 fL (ref 80.0–100.0)
Monocytes Absolute: 0.5 10*3/uL (ref 0.1–1.0)
Monocytes Relative: 6 %
Neutro Abs: 6.2 10*3/uL (ref 1.7–7.7)
Neutrophils Relative %: 73 %
Platelets: 249 10*3/uL (ref 150–400)
RBC: 3.44 MIL/uL — ABNORMAL LOW (ref 3.87–5.11)
RDW: 17.4 % — ABNORMAL HIGH (ref 11.5–15.5)
WBC: 8.4 10*3/uL (ref 4.0–10.5)
nRBC: 0 % (ref 0.0–0.2)

## 2018-12-24 LAB — OCCULT BLOOD X 1 CARD TO LAB, STOOL: Fecal Occult Bld: POSITIVE — AB

## 2018-12-24 LAB — APTT: aPTT: 30 seconds (ref 24–36)

## 2018-12-24 NOTE — Progress Notes (Signed)
Paged provider about positive stool occult.

## 2018-12-24 NOTE — Progress Notes (Signed)
PROGRESS NOTE  [name] [id number] DOB: Oct 23, 1940 DOA: 12/18/2018 PCP: Eloy End, MD  Brief History   78 y.o.femalewith medical history significant ofCAD, bilateral carotid artery stenosis status post right carotid enterectomy, myocardial infarction, hypertension, mitral valve insufficiency, atrial fibrillation on Eliquis, recent embolic stroke involving the right MCA status post TPA and unsuccessful IR brought in by son in concern for altered mental status and increasing weakness.  Patient was recently admitted from 8/31 to 9/12 with mechanical ventilation secondary toacutehypoxiarespiratory failure with findings ofembolic stroke involving the right MCA status post TPA and unsuccessful IR,and ultimately requiring PEG tube placement.Since returning home, patient has had residual left-sided weakness and slurred speech at baseline. Son reports that he was still talking to her and getting her up out of bed daily. However, about 2 days ago she became more nonverbal and was sleeping a lot more and had 2 episodes of diarrheaprompting him to bring her in for further evaluation. ED Course: The patient had mildly tachycardic up to 103.CBC shows mildly elevated WBC of 12.2 and a hemoglobin of 8.2 which is decreased from 10.8 two weeks ago. Fecal occult blood was also positive. CMP showed elevated sodium of 150 and increase creatinine of 1.20 and elevated BUN of 89. Urinalysis was positive for large leukocytes and negative nitrites with few bacteria, WBC and hyaline casts. CTA Angie of the head showed evolving large right MCA territory infarct from prior but no new infarct identified. Per d/w son: He states that patient tolerated continuous tube feeds well during prior hospitalization.  She however developed diarrhea with bolus feeds at home.  He states she was progressing well with home physical therapy until past Friday (9/25) when she started to sleep more and appeared  tired/progressively worse in terms of mental status.  He denies noticing any black stools but reports "dark" stools.  On 12/20/2018 the patient dropped her hemoglobin from 7.8 to 6.1 with a positive FOBT. She has been evaluated by GI. They did not feel that GI Bleed was the source of her anemia, and they did not recommend any invasive endoscopic procedures. The patient received 2 units of PRBC's in transfusion and was place on a protonix drip which has now been converted to IV BID. Her hemoglobin has stabilized and she has been restarted on her tube feeds.  The patient has had one episode of hypoglycemia. Will monitor glucoses carefully over the next 24 hours. Eliquis restarted on 12/23/2018. Hemoglobin dropped from 9.9 to 9.4. Continue to monitor. The patient is to have a follow up SLP eval tomorrow for swallow.   Consultants  . Wound Care . Gastroenterology  Procedures  . Transfusion of 2 units PRBC's  Antibiotics   Anti-infectives (From admission, onward)   Start     Dose/Rate Route Frequency Ordered Stop   12/20/18 1800  ampicillin-sulbactam (UNASYN) 1.5 g in sodium chloride 0.9 % 100 mL IVPB     1.5 g 200 mL/hr over 30 Minutes Intravenous Every 6 hours 12/20/18 1720     12/19/18 1800  cefTRIAXone (ROCEPHIN) 1 g in sodium chloride 0.9 % 100 mL IVPB  Status:  Discontinued     1 g 200 mL/hr over 30 Minutes Intravenous Every 24 hours 12/18/18 1929 12/20/18 1719   12/18/18 1715  cefTRIAXone (ROCEPHIN) 1 g in sodium chloride 0.9 % 100 mL IVPB     1 g 200 mL/hr over 30 Minutes Intravenous  Once 12/18/18 1703 12/18/18 1822     Subjective  The patient  is resting comfortably. The patient is complaining of abdominal cramping.  Objective   Vitals:  Vitals:   12/24/18 0900 12/24/18 1131  BP:  133/60  Pulse:  76  Resp:  16  Temp: 98 F (36.7 C) 98.8 F (37.1 C)  SpO2:  100%   Exam:  Constitutional:  . The patient is awake, alert, and oriented x 3. No acute distress. Respiratory:   . No increased work of breathing. . No wheezes, rales, or rhonchi . No tactile fremitus Cardiovascular:  . Regular rate and rhythm . No murmurs, ectopy, or gallups. . No lateral PMI. No thrills. Abdomen:  . Abdomen is soft, non-tender, non-distended . No hernias, masses, or organomegaly . Normoactive bowel sounds.  Musculoskeletal:  . No cyanosis, clubbing, or edema Skin:  . No rashes, lesions, ulcers . palpation of skin: no induration or nodules Neurologic:  . CN 2-12 intact . Sensation all 4 extremities intact Psychiatric:  . Mental status o Mood, affect appropriate o Orientation to person, place, time  . judgment and insight appear intact  I have personally reviewed the following:   Today's Data  . Vitals, CBC, BMP  Scheduled Meds: . sodium chloride   Intravenous Once  . apixaban  5 mg Per Tube BID  . aspirin  81 mg Per Tube Daily  . chlorhexidine  15 mL Mouth Rinse BID  . free water  200 mL Per Tube Q4H  . insulin aspart  0-9 Units Subcutaneous Q4H  . mouth rinse  15 mL Mouth Rinse q12n4p  . metoprolol tartrate  50 mg Per Tube BID  . pantoprazole (PROTONIX) IV  40 mg Intravenous Q12H  . simvastatin  20 mg Per Tube QHS   Continuous Infusions: . sodium chloride 250 mL (12/21/18 0116)  . ampicillin-sulbactam (UNASYN) IV 1.5 g (12/24/18 1015)  . feeding supplement (OSMOLITE 1.2 CAL) 1,000 mL (12/24/18 0901)  . sodium chloride      Active Problems:   Urinary tract bacterial infections   Pressure injury of skin   LOS: 6 days   A & P  Metabolic encephalopathy: Present on admission.  Likely secondary to hypernatremia, dehydration, uremia and possible UTI in the setting of recent stroke.  Appears to be improving.  Hypernatremia: In the setting of diarrhea/dehydration. Could be related to recent stroke as well. Improved from 151-->141 morning of 12/23/2018.  Son states he was administering 150 cc of free water via tube feed 4 times a day at home.  Currently on 150  cc free water every 4 hours.  Will increase to 200 cc and continue hypotonic IV fluids with D5W ( cautious given history of diabetes)-increase rate as repeat sodium this afternoon still at 141.   AKI/uremia: Continue IV hydration as well as via PEG tube and continue to hold ACE inhibitors.  Disproportionate elevation in BUN could be secondary to GI bleed as well.  Acute on chronic anemia: Fecal occult blood test positive on presentation.  Hemoglobin dropped to 8.2 from last discharge level of 10.8.  Could be secondary to upper GI bleed from PEG site mucosal injury, especially in the setting of multiple antiplatelets/anticoagulants as discussed with GI Dr. Paulita Fujita who evaluated patient today.  Unlikely to benefit from another EGD per GI as recent EGD during PEG placement reported normal esophagus/stomach and also given the risks in the setting of recent stroke. Started on Protonix drip and monitoring closely on IV heparin overnight-Hgb stable around 7.8-8. Seen by GI and PEG site bleeding ruled out.  GI recommended starting tube feeds in am (per their note although ordered earlier per d/w son). On 12/20/2018 the patient dropped her hemoglobin from 7.8 to 6.1 with a positive FOBT. She has been evaluated by GI. They did not feel that GI Bleed was the source of her anemia, and they did not recommend any invasive endoscopic procedures. The patient received 2 units of PRBC's in transfusion and was place on a protonix drip which has now been converted to IV BID. Her hemoglobin has stabilized and she has been restarted on her tube feeds. Hemoglobin is stable following transfusion of 2 units yesterday. Apixaban has been restarted. Mnitor hemoglobin for the next 24 hours.  Recent embolic CVA: Patient was discharged on Eliquis in addition to aspirin and cilostazol that she was taking.  Could have contributed to acute on chronic anemia. Currently resumed on aspirin and statins.  Eliquis held on admission and concern for  GI bleed.  Discussed with son regarding benefits and risks of continuing and holding anticoagulation respectively.  He agreed with resuming heparin and monitoring closely as there is no evidence of gross GI bleeding. So far tolerating okay. Eliquis was restarted on 12/23/2018. Hgb dropped from 9.9 to 9.4 the following morning. Will continue to monitor.   Paroxysmal atrial fibrillation: Now in normal sinus rhythm.  Eliquis held on admission. It was restarted on 12/23/2018.  Hgb dropped from 9.9 to 9.4 the following morning. Will continue to monitor.  Continue metoprolol.    UTI: Patient had abnormal UA with pyuria on presentation.  On IV Rocephin.  Leukocytosis now resolved.  Urine cx reporting pseudomonas/Enterococci-- ?contamination. f/u sensitivities. Will change to Unasyn for now. First dose 12/20/2018.  Dysphagia/dysarthria: In the setting of recent stroke.  Status post PEG tube in previous admission.  Will need speech/nutrition evaluation to advise on optimal diet as per son patient was not tolerating bolus feeds well.  She will be evaluated by SLP again on Monday.  PAD/carotid artery stenosis: Status post carotid endarterectomy.  On aspirin/cilostazol/statins at baseline  Diabetes mellitus: Patient on metformin at home.  Currently on dextrose fluid in concern for hypernatremia.  Watch blood glucose closely, continue sliding scale insulin.  Added Lantus for additional coverage while on dextrose fluids-so far BG okay-titrate as needed. Patient has been hypoglycemic today. I have changed her fluid to D10 and held her lantus. She is still requiring correctional insulin.  Pressure ulcer: pink foam patch in place. Wound care consult.  I have seen and examined this patient myself. I have spent 32 minutes in her evaluation and care.  DVT prophylaxis: SCDs Code Status: DNR Family / Patient Communication: Discussed with son bedside Disposition Plan: SNF  Aliese Brannum, DO [facility]  Direct contact: see [email or web address]  7PM-7AM contact night coverage as above 12/24/2018, 15:55 PM  LOS: 3 days

## 2018-12-25 LAB — CBC WITH DIFFERENTIAL/PLATELET
Abs Immature Granulocytes: 0.06 10*3/uL (ref 0.00–0.07)
Basophils Absolute: 0 10*3/uL (ref 0.0–0.1)
Basophils Relative: 0 %
Eosinophils Absolute: 0.1 10*3/uL (ref 0.0–0.5)
Eosinophils Relative: 2 %
HCT: 29.4 % — ABNORMAL LOW (ref 36.0–46.0)
Hemoglobin: 9.4 g/dL — ABNORMAL LOW (ref 12.0–15.0)
Immature Granulocytes: 1 %
Lymphocytes Relative: 16 %
Lymphs Abs: 1.4 10*3/uL (ref 0.7–4.0)
MCH: 27.1 pg (ref 26.0–34.0)
MCHC: 32 g/dL (ref 30.0–36.0)
MCV: 84.7 fL (ref 80.0–100.0)
Monocytes Absolute: 0.6 10*3/uL (ref 0.1–1.0)
Monocytes Relative: 7 %
Neutro Abs: 6.7 10*3/uL (ref 1.7–7.7)
Neutrophils Relative %: 74 %
Platelets: 228 10*3/uL (ref 150–400)
RBC: 3.47 MIL/uL — ABNORMAL LOW (ref 3.87–5.11)
RDW: 17.5 % — ABNORMAL HIGH (ref 11.5–15.5)
WBC: 8.9 10*3/uL (ref 4.0–10.5)
nRBC: 0 % (ref 0.0–0.2)

## 2018-12-25 LAB — TYPE AND SCREEN
ABO/RH(D): A POS
Antibody Screen: NEGATIVE
Unit division: 0
Unit division: 0
Unit division: 0

## 2018-12-25 LAB — GLUCOSE, CAPILLARY
Glucose-Capillary: 147 mg/dL — ABNORMAL HIGH (ref 70–99)
Glucose-Capillary: 177 mg/dL — ABNORMAL HIGH (ref 70–99)
Glucose-Capillary: 180 mg/dL — ABNORMAL HIGH (ref 70–99)
Glucose-Capillary: 199 mg/dL — ABNORMAL HIGH (ref 70–99)
Glucose-Capillary: 213 mg/dL — ABNORMAL HIGH (ref 70–99)
Glucose-Capillary: 23 mg/dL — CL (ref 70–99)
Glucose-Capillary: 240 mg/dL — ABNORMAL HIGH (ref 70–99)
Glucose-Capillary: 27 mg/dL — CL (ref 70–99)
Glucose-Capillary: 36 mg/dL — CL (ref 70–99)

## 2018-12-25 LAB — MAGNESIUM: Magnesium: 1.6 mg/dL — ABNORMAL LOW (ref 1.7–2.4)

## 2018-12-25 LAB — BPAM RBC
Blood Product Expiration Date: 202010212359
Blood Product Expiration Date: 202010212359
Blood Product Expiration Date: 202010272359
ISSUE DATE / TIME: 202010010515
ISSUE DATE / TIME: 202010011011
Unit Type and Rh: 6200
Unit Type and Rh: 6200
Unit Type and Rh: 6200

## 2018-12-25 LAB — COMPREHENSIVE METABOLIC PANEL
ALT: 50 U/L — ABNORMAL HIGH (ref 0–44)
AST: 35 U/L (ref 15–41)
Albumin: 1.8 g/dL — ABNORMAL LOW (ref 3.5–5.0)
Alkaline Phosphatase: 57 U/L (ref 38–126)
Anion gap: 9 (ref 5–15)
BUN: 11 mg/dL (ref 8–23)
CO2: 23 mmol/L (ref 22–32)
Calcium: 8.2 mg/dL — ABNORMAL LOW (ref 8.9–10.3)
Chloride: 111 mmol/L (ref 98–111)
Creatinine, Ser: 0.74 mg/dL (ref 0.44–1.00)
GFR calc Af Amer: >60 mL/min (ref >60)
GFR calc non Af Amer: >60 mL/min (ref >60)
Glucose, Bld: 204 mg/dL — ABNORMAL HIGH (ref 70–99)
Potassium: 3.7 mmol/L (ref 3.5–5.1)
Sodium: 143 mmol/L (ref 135–145)
Total Bilirubin: 0.4 mg/dL (ref 0.3–1.2)
Total Protein: 5.2 g/dL — ABNORMAL LOW (ref 6.5–8.1)

## 2018-12-25 LAB — APTT: aPTT: 32 seconds (ref 24–36)

## 2018-12-25 MED ORDER — POTASSIUM CHLORIDE CRYS ER 20 MEQ PO TBCR
20.0000 meq | EXTENDED_RELEASE_TABLET | Freq: Once | ORAL | Status: AC
  Administered 2018-12-25: 15:00:00 20 meq via ORAL
  Filled 2018-12-25: qty 1

## 2018-12-25 MED ORDER — LORATADINE 5 MG/5ML PO SYRP
10.0000 mg | ORAL_SOLUTION | Freq: Every day | ORAL | Status: DC
  Administered 2018-12-25 – 2018-12-26 (×2): 10 mg
  Filled 2018-12-25 (×3): qty 10

## 2018-12-25 MED ORDER — METOPROLOL TARTRATE 25 MG/10 ML ORAL SUSPENSION
25.0000 mg | Freq: Once | ORAL | Status: AC
  Administered 2018-12-25: 25 mg
  Filled 2018-12-25: qty 10

## 2018-12-25 MED ORDER — METOPROLOL TARTRATE 25 MG/10 ML ORAL SUSPENSION
75.0000 mg | Freq: Two times a day (BID) | ORAL | Status: DC
  Administered 2018-12-25 – 2018-12-26 (×2): 75 mg
  Filled 2018-12-25 (×3): qty 30

## 2018-12-25 MED ORDER — MAGNESIUM SULFATE 2 GM/50ML IV SOLN
2.0000 g | Freq: Once | INTRAVENOUS | Status: AC
  Administered 2018-12-25: 15:00:00 2 g via INTRAVENOUS
  Filled 2018-12-25: qty 50

## 2018-12-25 NOTE — Discharge Instructions (Signed)

## 2018-12-25 NOTE — Progress Notes (Signed)
Patient has had 3 runs of non substain V-Tach 3-5 bts tonight. With no complaints resting in bed.

## 2018-12-25 NOTE — Progress Notes (Addendum)
PROGRESS NOTE  [name] [id number] DOB: [birth year] DOA: 12/18/2018 PCP: Eloy End, MD  Brief History   78 y.o.femalewith medical history significant ofCAD, bilateral carotid artery stenosis status post right carotid enterectomy, myocardial infarction, hypertension, mitral valve insufficiency, atrial fibrillation on Eliquis, recent embolic stroke involving the right MCA status post TPA and unsuccessful IR brought in by son in concern for altered mental status and increasing weakness.   Patient was recently admitted from 8/31 to 9/12 with mechanical ventilation secondary toacutehypoxiarespiratory failure with findings ofembolic stroke involving the right MCA status post TPA and unsuccessful IR,and ultimately requiring PEG tube placement.Since returning home, patient has had residual left-sided weakness and slurred speech at baseline. Son reports that he was still talking to her and getting her up out of bed daily. However, about 2 days ago she became more nonverbal and was sleeping a lot more and had 2 episodes of diarrheaprompting him to bring her in for further evaluation.  The patient had mildly tachycardic up to 103.CBC shows mildly elevated WBC of 12.2 and a hemoglobin of 8.2 which is decreased from 10.8 two weeks ago. Fecal occult blood was also positive. CMP showed elevated sodium of 150 and increase creatinine of 1.20 and elevated BUN of 89. Urinalysis was positive for large leukocytes and negative nitrites with few bacteria, WBC and hyaline casts. CTA Angie of the head showed evolving large right MCA territory infarct from prior but no new infarct identified.  He states that patient tolerated continuous tube feeds well during prior hospitalization.  She however developed diarrhea with bolus feeds at home.  He states she was progressing well with home physical therapy until past Friday (9/25) when she started to sleep more and appeared tired/progressively worse  in terms of mental status.  He denies noticing any black stools but reports "dark" stools.  On 12/20/2018 the patient dropped her hemoglobin from 7.8 to 6.1 with a positive FOBT. She has been evaluated by GI. They did not feel that GI Bleed was the source of her anemia, and they did not recommend any invasive endoscopic procedures. The patient received 2 units of PRBC's in transfusion and was place on a protonix drip which has now been converted to IV BID. Her hemoglobin has stabilized and she has been restarted on her tube feeds.  The patient has had one episode of hypoglycemia. Will monitor glucoses carefully over the next 24 hours. Eliquis restarted on 12/23/2018. Hemoglobin dropped from 9.9 to 9.4. Continue to monitor. The patient is to have a follow up SLP eval today for swallow. This is pending.  Consultants  . Wound Care . Gastroenterology  Procedures  . Transfusion of 2 units PRBC's  Antibiotics   Anti-infectives (From admission, onward)   Start     Dose/Rate Route Frequency Ordered Stop   12/20/18 1800  ampicillin-sulbactam (UNASYN) 1.5 g in sodium chloride 0.9 % 100 mL IVPB     1.5 g 200 mL/hr over 30 Minutes Intravenous Every 6 hours 12/20/18 1720     12/19/18 1800  cefTRIAXone (ROCEPHIN) 1 g in sodium chloride 0.9 % 100 mL IVPB  Status:  Discontinued     1 g 200 mL/hr over 30 Minutes Intravenous Every 24 hours 12/18/18 1929 12/20/18 1719   12/18/18 1715  cefTRIAXone (ROCEPHIN) 1 g in sodium chloride 0.9 % 100 mL IVPB     1 g 200 mL/hr over 30 Minutes Intravenous  Once 12/18/18 1703 12/18/18 1822     Subjective  The patient  is resting comfortably. The patient had episodes of NSVT overnight.  Objective   Vitals:  Vitals:   12/25/18 1100 12/25/18 1202  BP: (!) 146/72 (!) 142/62  Pulse: 88 82  Resp:  18  Temp:  98.1 F (36.7 C)  SpO2: 98% 100%   Exam:  Constitutional:  . The patient is sleeping soundly. No acute distress. Respiratory:  . No increased work of  breathing. . No wheezes, rales, or rhonchi . No tactile fremitus Cardiovascular:  . Regular rate and rhythm . No murmurs, ectopy, or gallups. . No lateral PMI. No thrills. Abdomen:  . Abdomen is soft, non-tender, non-distended . No hernias, masses, or organomegaly . Normoactive bowel sounds.  Musculoskeletal:  . No cyanosis, clubbing, or edema Skin:  . No rashes, lesions, ulcers . palpation of skin: no induration or nodules Neurologic:  . CN 2-12 intact . Sensation all 4 extremities intact Psychiatric:  . Mental status o Mood, affect appropriate o Orientation to person, place, time  . judgment and insight appear intact  I have personally reviewed the following:   Today's Data  . Vitals, CBC, BMP  Scheduled Meds: . sodium chloride   Intravenous Once  . apixaban  5 mg Per Tube BID  . aspirin  81 mg Per Tube Daily  . chlorhexidine  15 mL Mouth Rinse BID  . free water  200 mL Per Tube Q4H  . insulin aspart  0-9 Units Subcutaneous Q4H  . mouth rinse  15 mL Mouth Rinse q12n4p  . metoprolol tartrate  75 mg Per Tube BID  . pantoprazole (PROTONIX) IV  40 mg Intravenous Q12H  . simvastatin  20 mg Per Tube QHS   Continuous Infusions: . sodium chloride 250 mL (12/21/18 0116)  . ampicillin-sulbactam (UNASYN) IV 1.5 g (12/25/18 1031)  . feeding supplement (OSMOLITE 1.2 CAL) 1,000 mL (12/25/18 0706)  . magnesium sulfate bolus IVPB    . sodium chloride      Active Problems:   Urinary tract bacterial infections   Pressure injury of skin   LOS: 7 days   A & P  Metabolic encephalopathy: Present on admission.  Likely secondary to hypernatremia, dehydration, uremia and possible UTI in the setting of recent stroke.  Appears to be improving.  Hypernatremia: In the setting of diarrhea/dehydration. Could be related to recent stroke as well. Improved from 151-->141 morning of 12/23/2018.  Son states he was administering 150 cc of free water via tube feed 4 times a day at home.   Currently on 150 cc free water every 4 hours.  Will increase to 200 cc and continue hypotonic IV fluids with D5W ( cautious given history of diabetes)-increase rate as repeat sodium this afternoon still at 141.   NSVT: 3 runs of non substain V-Tach 3-5 bts overnight. Beta blocker increased and magnesium has been supplemented. Will supplement potassium as well.  AKI/uremia: Continue IV hydration as well as via PEG tube and continue to hold ACE inhibitors.  Disproportionate elevation in BUN could be secondary to GI bleed as well.  Acute on chronic anemia: Fecal occult blood test positive on presentation.  Hemoglobin dropped to 8.2 from last discharge level of 10.8.  Could be secondary to upper GI bleed from PEG site mucosal injury, especially in the setting of multiple antiplatelets/anticoagulants as discussed with GI Dr. Paulita Fujita who evaluated patient today.  Unlikely to benefit from another EGD per GI as recent EGD during PEG placement reported normal esophagus/stomach and also given the risks in the  setting of recent stroke. Started on Protonix drip and monitoring closely on IV heparin overnight-Hgb stable around 7.8-8. Seen by GI and PEG site bleeding ruled out. GI recommended starting tube feeds in am (per their note although ordered earlier per d/w son). On 12/20/2018 the patient dropped her hemoglobin from 7.8 to 6.1 with a positive FOBT. She has been evaluated by GI. They did not feel that GI Bleed was the source of her anemia, and they did not recommend any invasive endoscopic procedures. The patient received 2 units of PRBC's in transfusion and was place on a protonix drip which has now been converted to IV BID. Her hemoglobin has stabilized and she has been restarted on her tube feeds. Hemoglobin is stable following transfusion of 2 units yesterday. Apixaban has been restarted. Mnitor hemoglobin for the next 24 hours.  Recent embolic CVA: Patient was discharged on Eliquis in addition to aspirin and  cilostazol that she was taking.  Could have contributed to acute on chronic anemia. Currently resumed on aspirin and statins.  Eliquis held on admission and concern for GI bleed.  Discussed with son regarding benefits and risks of continuing and holding anticoagulation respectively.  He agreed with resuming heparin and monitoring closely as there is no evidence of gross GI bleeding. So far tolerating okay. Eliquis was restarted on 12/23/2018. Hgb dropped from 9.9 to 9.4 the following morning. Will continue to monitor.   Paroxysmal atrial fibrillation: Now in normal sinus rhythm.  Eliquis held on admission. It was restarted on 12/23/2018.  Hgb dropped from 9.9 to 9.4 the following morning. Will continue to monitor.  Continue metoprolol.    UTI: Patient had abnormal UA with pyuria on presentation.  On IV Rocephin.  Leukocytosis now resolved.  Urine cx reporting pseudomonas/Enterococci-- ?contamination. f/u sensitivities. Will change to Unasyn for now. First dose 12/20/2018.  Dysphagia/dysarthria: In the setting of recent stroke.  Status post PEG tube in previous admission.  Will need speech/nutrition evaluation to advise on optimal diet as per son patient was not tolerating bolus feeds well.  She will be evaluated by SLP again later today.  PAD/carotid artery stenosis: Status post carotid endarterectomy.  On aspirin/cilostazol/statins at baseline  Diabetes mellitus: Patient on metformin at home.  Currently on dextrose fluid in concern for hypernatremia.  Watch blood glucose closely, continue sliding scale insulin.  Added Lantus for additional coverage while on dextrose fluids-so far BG okay-titrate as needed. Patient has been hypoglycemic today. I have changed her fluid to D10 and held her lantus. She is still requiring correctional insulin.  Pressure ulcer: pink foam patch in place. Wound care consult.  I have seen and examined this patient myself. I have spent 35 minutes in her evaluation and care.   DVT prophylaxis: SCDs Code Status: DNR Family / Patient Communication: Discussed with son bedside Disposition Plan: Home with home health.  Drystan Reader, DO [facility] Direct contact: see [email or web address]  7PM-7AM contact night coverage as above 12/25/2018, 2:35 PM  LOS: 3 days

## 2018-12-25 NOTE — Progress Notes (Signed)
Son at bedside stating pt to have "swallowing test" done today and possible d/c. Dr. Benny Lennert notified regarding swallowing test not ordered. Will continue to monitor.

## 2018-12-26 DIAGNOSIS — Z794 Long term (current) use of insulin: Secondary | ICD-10-CM

## 2018-12-26 DIAGNOSIS — E118 Type 2 diabetes mellitus with unspecified complications: Secondary | ICD-10-CM

## 2018-12-26 DIAGNOSIS — I480 Paroxysmal atrial fibrillation: Secondary | ICD-10-CM

## 2018-12-26 DIAGNOSIS — I69391 Dysphagia following cerebral infarction: Secondary | ICD-10-CM

## 2018-12-26 LAB — APTT: aPTT: 33 seconds (ref 24–36)

## 2018-12-26 LAB — CBC WITH DIFFERENTIAL/PLATELET
Abs Immature Granulocytes: 0.03 10*3/uL (ref 0.00–0.07)
Basophils Absolute: 0 10*3/uL (ref 0.0–0.1)
Basophils Relative: 0 %
Eosinophils Absolute: 0.2 10*3/uL (ref 0.0–0.5)
Eosinophils Relative: 2 %
HCT: 29.2 % — ABNORMAL LOW (ref 36.0–46.0)
Hemoglobin: 9.2 g/dL — ABNORMAL LOW (ref 12.0–15.0)
Immature Granulocytes: 1 %
Lymphocytes Relative: 20 %
Lymphs Abs: 1.3 10*3/uL (ref 0.7–4.0)
MCH: 26.7 pg (ref 26.0–34.0)
MCHC: 31.5 g/dL (ref 30.0–36.0)
MCV: 84.6 fL (ref 80.0–100.0)
Monocytes Absolute: 0.6 10*3/uL (ref 0.1–1.0)
Monocytes Relative: 9 %
Neutro Abs: 4.4 10*3/uL (ref 1.7–7.7)
Neutrophils Relative %: 68 %
Platelets: 216 10*3/uL (ref 150–400)
RBC: 3.45 MIL/uL — ABNORMAL LOW (ref 3.87–5.11)
RDW: 17.3 % — ABNORMAL HIGH (ref 11.5–15.5)
WBC: 6.5 10*3/uL (ref 4.0–10.5)
nRBC: 0 % (ref 0.0–0.2)

## 2018-12-26 LAB — BASIC METABOLIC PANEL
Anion gap: 7 (ref 5–15)
BUN: 11 mg/dL (ref 8–23)
CO2: 24 mmol/L (ref 22–32)
Calcium: 8 mg/dL — ABNORMAL LOW (ref 8.9–10.3)
Chloride: 110 mmol/L (ref 98–111)
Creatinine, Ser: 0.64 mg/dL (ref 0.44–1.00)
GFR calc Af Amer: >60 mL/min (ref >60)
GFR calc non Af Amer: >60 mL/min (ref >60)
Glucose, Bld: 134 mg/dL — ABNORMAL HIGH (ref 70–99)
Potassium: 4.1 mmol/L (ref 3.5–5.1)
Sodium: 141 mmol/L (ref 135–145)

## 2018-12-26 LAB — GLUCOSE, CAPILLARY
Glucose-Capillary: 126 mg/dL — ABNORMAL HIGH (ref 70–99)
Glucose-Capillary: 126 mg/dL — ABNORMAL HIGH (ref 70–99)
Glucose-Capillary: 194 mg/dL — ABNORMAL HIGH (ref 70–99)
Glucose-Capillary: 208 mg/dL — ABNORMAL HIGH (ref 70–99)

## 2018-12-26 LAB — MAGNESIUM: Magnesium: 1.9 mg/dL (ref 1.7–2.4)

## 2018-12-26 MED ORDER — OSMOLITE 1.2 CAL PO LIQD: 1000.0000 mL | ORAL | 0 refills | Status: AC

## 2018-12-26 MED ORDER — FREE WATER: 200.0000 mL | 0 refills | Status: AC

## 2018-12-26 MED ORDER — METOPROLOL TARTRATE 25 MG/10 ML ORAL SUSPENSION: 75.0000 mg | Freq: Two times a day (BID) | ORAL | 0 refills | Status: AC

## 2018-12-26 MED ORDER — CHLORHEXIDINE GLUCONATE 0.12 % MT SOLN: 15.0000 mL | Freq: Two times a day (BID) | OROMUCOSAL | 0 refills | Status: AC

## 2018-12-26 MED ORDER — LORATADINE 5 MG/5ML PO SYRP: 10.0000 mg | ORAL_SOLUTION | Freq: Every day | ORAL | 12 refills | Status: DC

## 2018-12-26 NOTE — Discharge Summary (Signed)
Physician Discharge Summary  [name] [id number] DOB: Aug 18, 1940 DOA: 12/18/2018  PCP: Eloy End, MD  Admit date: 12/18/2018 Discharge date: 12/26/2018  Recommendations for Outpatient Follow-up:  1. Follow up with PCP in 7-10 days. 2. BMP in one week. Report results to PCP.  Follow-up Information    [facility].   Why: Tube feeding to change to cont. feeds- they will supply feeding pump and feeding.  [phone]) Contact information: [place] [address] [phone]        [facility] Follow up.   Specialty: [place] Why: [place] services to resume- they will call you set visits back up.  Contact information: [place] [address] [phone]            Discharge Diagnoses: Principal diagnosis is #1 1. Metabolic encephalopathy 2. Hypernatremia 3. NSVT 4. AKI 5. Acute on chronic anemia 6. Recent embolic CVA 7. Paroxysmal atrial fibrillation 8. UTI 9. Dysphagia 10. Peripheral artery disease/carotid artery stenosis 11. DM II 12. Pressure ulcer.  Discharge Condition: Fair Disposition: Home  Diet recommendation: Osmolite 1.2 at 55 cc /hr continuous. 200 cc free water q4 hours.  Filed Weights   12/24/18 0402 12/25/18 0500 12/26/18 0300  Weight: 45.5 kg 43.9 kg 44.4 kg    History of present illness:  [name] is a 78 y.o. female with medical history significant of  CAD, bilateral carotid artery stenosis status post right carotid enterectomy, myocardial infarction, hypertension, mitral valve insufficiency, atrial fibrillation on Eliquis, recent embolic stroke involving the right MCA status post TPA and unsuccessful IR who presented with altered mental status and increasing weakness.  Son at bedside provides history as patient has incoherent speech.  Patient was recently admitted from 8/31 to 9/12 with mechanical ventilation secondary to acute  hypoxia respiratory failure with findings of embolic stroke involving the right MCA status post TPA and unsuccessful IR, and ultimately requiring PEG tube placement.  Since returning home, patient has had residual left-sided weakness and slurred speech at baseline.  Son reports that he was still talking to her and getting her up out of bed daily.  However, about 2 days ago she became more nonverbal and was sleeping a lot more and had 2 episodes of diarrhea prompting him to bring her into the emergency department today for further evaluation.  ED Course:  she was afebrile and normotensive but mildly tachycardic up to 103 on room air.  patient was laying comfortable in bed and was oriented to herself, her son only.  She was able to follow some simple commands but unable to answer any questions regarding her symptoms.  CBC shows mildly elevated WBC of 12.2 and a hemoglobin of 8.2 which is decreased from 10.8 two weeks ago.  Fecal occult blood was also positive.  CMP showed elevated sodium of 150 and increase creatinine of 1.20 and elevated BUN of 89.  Urinalysis was positive for large leukocytes and negative nitrites with few bacteria, WBC and hyaline casts.  CTA Angie of the head showed evolving large right MCA territory infarct from prior but no new infarct identified.  Hospital Course:  He statesthat patient tolerated continuous tube feeds well during prior hospitalization. She however developed diarrhea with bolus feeds at home. He states she was progressing well with home physical therapy until past Friday (9/25) when she started to sleep more and appeared tired/progressively worse in terms of mental status.  He denies noticing any black stools but reports "dark" stools.  On 12/20/2018 the patient dropped her hemoglobin from 7.8 to 6.1 with a positive FOBT. She has been evaluated by GI. They did not feel that GI Bleed was the source of her anemia, and they did not recommend any invasive endoscopic  procedures. The patient received 2 units of PRBC's in transfusion and was place on a protonix drip which has now been converted to IV BID. Her hemoglobin has stabilized and she has been restarted on her tube feeds.  The patient has had one episode of hypoglycemia. Will monitor glucoses carefully over the next 24 hours. Eliquis restarted on 12/23/2018. Hemoglobin dropped from 9.9 to 9.4. Continue to monitor. The patient is to have a follow up SLP eval today for swallow. This demonstrated no improvement in her dysphagia.  Today's assessment: S: The patient is resting comfortably. No new complaints. O: Vitals:  Vitals:   12/26/18 1100 12/26/18 1200  BP: (!) 136/54 (!) 138/93  Pulse: 78   Resp:    Temp: 98.6 F (37 C)   SpO2: 100%    Constitutional:   The patient is sleeping soundly. No acute distress. Respiratory:   No increased work of breathing.  No wheezes, rales, or rhonchi  No tactile fremitus Cardiovascular:   Regular rate and rhythm  No murmurs, ectopy, or gallups.  No lateral PMI. No thrills. Abdomen:   Abdomen is soft, non-tender, non-distended  No hernias, masses, or organomegaly  Normoactive bowel sounds.  Musculoskeletal:   No cyanosis, clubbing, or edema Skin:   No rashes, lesions, ulcers  palpation of skin: no induration or nodules Neurologic:   CN 2-12 intact  Sensation all 4 extremities intact Psychiatric:   Mental status ? Mood, affect appropriate ? Orientation to person, place, time   judgment and insight appear intact  Discharge Instructions  Discharge Instructions    Activity as tolerated - No restrictions   Complete by: As directed    Call MD for:  difficulty breathing, headache or visual disturbances   Complete by: As directed    Call MD for:  persistant nausea and vomiting   Complete by: As directed    Call MD for:  redness, tenderness, or signs of infection (pain, swelling, redness, odor or green/yellow discharge around  incision site)   Complete by: As directed    Call MD for:  temperature >100.4   Complete by: As directed    Discharge instructions   Complete by: As directed    Follow up with PCP in 7-10 days. BMP in one week.   Discharge instructions   Complete by: As directed    Resume previous home health services.   Other Restrictions   Complete by: As directed    NPO Tube feeds with osmolite 1.2 at 55cc/hr with 200 cc free water flushes q4h.     Allergies as of 12/26/2018   No Known Allergies     Medication List    STOP taking these medications   Entresto 49-51 MG Generic drug: sacubitril-valsartan   feeding supplement (PRO-STAT SUGAR FREE 64) Liqd   hydrochlorothiazide 12.5 MG capsule Commonly known as: Microzide   irbesartan 300 MG tablet Commonly known as: AVAPRO   metFORMIN 850 MG tablet Commonly known as: GLUCOPHAGE   metoprolol tartrate 50 MG tablet Commonly known as: LOPRESSOR Replaced by: metoprolol tartrate 25 mg/10 mL Susp     TAKE these medications   apixaban 5 MG Tabs tablet Commonly known as: [employer]  1 tablet (5 mg total) into feeding tube every 12 (twelve) hours. Crush med prior to putting in tube and administer promptly Notes to patient: Take as directed    aspirin 81 MG chewable tablet Place 1 tablet (81 mg total) into feeding tube daily. Crush med prior to putting in tube   chlorhexidine 0.12 % solution Commonly known as: PERIDEX 15 mLs by Mouth Rinse route 2 (two) times daily.   dicyclomine 10 MG capsule Commonly known as: BENTYL Place 1 capsule (10 mg total) into feeding tube 4 (four) times daily as needed for spasms. Crush med prior to putting in tube   feeding supplement (OSMOLITE 1.2 CAL) Liqd Place 1,000 mLs into feeding tube continuous. What changed:   how much to take  how to take this  when to take this  additional instructions Notes to patient: Take as directed    free water Soln Place 200 mLs into feeding tube every 4  (four) hours. What changed: how much to take Notes to patient: Take as directed    loratadine 5 MG/5ML syrup Commonly known as: CLARITIN Place 10 mLs (10 mg total) into feeding tube daily. Start taking on: December 27, 2018   metoprolol tartrate 25 mg/10 mL Susp Commonly known as: LOPRESSOR Place 30 mLs (75 mg total) into feeding tube 2 (two) times daily. Replaces: metoprolol tartrate 50 MG tablet   pantoprazole sodium 40 mg/20 mL Pack Commonly known as: PROTONIX Place 20 mLs (40 mg total) into feeding tube at bedtime.   simvastatin 20 MG tablet Commonly known as: ZOCOR Place 1 tablet (20 mg total) into feeding tube at bedtime. Crush med prior to putting in tube            Durable Medical Equipment  (From admission, onward)         Start     Ordered   12/26/18 1303  For home use only DME Tube feeding pump  Once    Comments: TF recommendations for home:  -Continuous feedings of Osmolite 1.2 @ 55 ml/hr via PEG -200 ml free water flush 4 times daily  Tube feeding regimen provides1584kcal (100% of needs),73grams of protein, and 1833ml of H2O.    Question:  Length of Need  Answer:  Lifetime   12/26/18 1303         No Known Allergies  The results of significant diagnostics from this hospitalization (including imaging, microbiology, ancillary and laboratory) are listed below for reference.    Significant Diagnostic Studies: Ct Angio Head W Or Wo Contrast  Result Date: 12/18/2018 CLINICAL DATA:  Altered mental status. Recent large right MCA infarct. EXAM: CT ANGIOGRAPHY HEAD TECHNIQUE: Multidetector CT imaging of the head was performed using the standard protocol during bolus administration of intravenous contrast. Multiplanar CT image reconstructions and MIPs were obtained to evaluate the vascular anatomy. CONTRAST:  4mL OMNIPAQUE IOHEXOL 350 MG/ML SOLN COMPARISON:  Head CT 11/25/2018, head MRI 11/21/2018, and head CTA 11/20/2018 FINDINGS: CT HEAD Brain: A large  right MCA territory infarct demonstrates decreased edema and mass effect. There is no residual midline shift or residual mass effect on the right lateral ventricle. There is no evidence of significant interval infarct extension. Scattered small regions of gyral hyperattenuation within the infarct are new and may reflect petechial hemorrhage and/or mineralization including a more confluent 2 cm region in the right superior frontal gyrus. No new infarct is identified. Small chronic infarcts are again seen in the left frontal lobe, left occipital lobe, and cerebellum. There  is no extra-axial fluid collection. Vascular: Calcified atherosclerosis at the skull base. No suspicious vascular hyperdensity. Skull: No fracture or focal osseous lesion. Sinuses: Visualized paranasal sinuses and mastoid air cells are clear. Orbits: Right cataract extraction. CTA HEAD Anterior circulation: The distal cervical and intracranial portions of the right internal carotid artery are patent with calcified plaque resulting in moderate cavernous and paraclinoid stenoses, unchanged from the recent prior study. A 2 mm aneurysm projects inferiorly from the right cavernous ICA. As seen on the prior study, there is chronic occlusion of the left internal carotid artery in the neck. There is reconstitution of the ICA terminus with patency of the left ACA and MCA origins. The previously revascularized right M1 segment remains patent without significant stenosis. No proximal branch occlusion is evident either. Posterior circulation: The visualized distal left vertebral artery is patent. There is only at most faint opacification of the right V3 segment which was better opacified on the prior CTA although the vessel was previously shown to be occluded at its origin with distal reconstitution. The right V4 segment is grossly patent though diminutive with severe multifocal narrowing. The left PICA and right AICA appear dominant. The basilar artery is  patent with mild diffuse irregularity but no significant stenosis. PCAs are patent with mild irregularity but no significant proximal stenosis. No aneurysm is identified. Venous sinuses: Not well evaluated due to arterial contrast timing. Anatomic variants: None. IMPRESSION: 1. Evolving large right MCA territory infarct with decreased edema and mass effect and no residual midline shift. New small volume petechial hemorrhage and/or mineralization. 2. No new infarct identified. 3. Persistent patency of the right MCA status post endovascular revascularization of an M1 occlusion. 4. 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 5. Unchanged moderate right intracranial ICA stenoses. 6. 2 mm right cavernous ICA aneurysm. Electronically Signed   By: Logan Bores M.D.   On: 12/18/2018 18:20   Dg Chest Portable 1 View  Result Date: 12/18/2018 CLINICAL DATA:  Altered mental status EXAM: PORTABLE CHEST 1 VIEW COMPARISON:  11/25/2018 FINDINGS: Numerous wires and leads overlying the patient's chest. Curvilinear tubing over the left chest. No focal opacity or pleural effusion. Mild cardiomegaly with aortic atherosclerosis. No pneumothorax. IMPRESSION: No active disease.  Mild cardiomegaly. Electronically Signed   By: Donavan Foil M.D.   On: 12/18/2018 15:24   Dg Swallowing Func-speech Pathology  Result Date: 11/28/2018 Objective Swallowing Evaluation: Type of Study: MBS-Modified Barium Swallow Study  Patient Details Name: LYSANDRA ROETTGER MRN: [id number] Date of Birth: 09-Apr-1940 Today's Date: 11/28/2018 Time: SLP Start Time (ACUTE ONLY): 1215 -SLP Stop Time (ACUTE ONLY): [id number] SLP Time Calculation (min) (ACUTE ONLY): 24 min Past Medical History: Past Medical History: Diagnosis Date  Arthritis   CAD (coronary artery disease)   Carotid artery occlusion   Diabetes mellitus   Hyperlipidemia   Hypertension   Myocardial infarction [facility])  Past Surgical History: Past Surgical History: Procedure Laterality Date   CAROTID ENDARTERECTOMY  01/06/2007  right - with Dacron patch angioplasty  HERNIA REPAIR  2012  IR CT HEAD LTD  11/20/2018  IR PERCUTANEOUS ART THROMBECTOMY/INFUSION INTRACRANIAL INC DIAG ANGIO  11/20/2018  RADIOLOGY WITH ANESTHESIA N/A 11/20/2018  Procedure: CODE STROKE;  Surgeon: Luanne Bras, MD;  Location: [facility];  Service: Radiology;  Laterality: N/A; HPI: Xuri Purdom is a 78 y.o. F with PMH of CAD and MI, HTN, HL, cardiomyopathy, Type II DM, and bilateral carotid artery stenosis s/p right carotid endarterectomy in 2008. Pt was found  by her son 8/31 in bed with flaccid L side, L facial droop and slurred speech; prior level of functioning was independent. MRI revealed R middle cerebral infarct affecting the insula and frontoparietal brain, CXR normal. Pt was intubated upon arrival on 8/31, extubated 9/1, and is currently on room air.  No data recorded Assessment / Plan / Recommendation CHL IP CLINICAL IMPRESSIONS 11/28/2018 Clinical Impression Pt was awake for study with periods of drowsiness requiring tactile stimulation intermittently. She was unable to manipulate, control or transit honey thick, puree or thin barium trials. Only one swallow was initiated throughout the study which appeared to be her saliva without barium/bolus. Barium remained on anterior tongue, anterior and left sulci and leaked from left side oral cavity despite multiple dry spoon presentations, tactile stimulation and verbal cueing. Barium was suctioned from oral cavity and cleaned with a toothette sponge. Her deconditioning has increased each day ST has worked with pt and overall prognosis is unknown. Palliative care may be helpful for decision making in regards to nutrition.    SLP Visit Diagnosis Dysphagia, oral phase (R13.11) Attention and concentration deficit following -- Frontal lobe and executive function deficit following -- Impact on safety and function Severe aspiration risk   CHL IP TREATMENT RECOMMENDATION 11/28/2018  Treatment Recommendations Therapy as outlined in treatment plan below   Prognosis 11/28/2018 Prognosis for Safe Diet Advancement Guarded Barriers to Reach Goals Severity of deficits Barriers/Prognosis Comment -- CHL IP DIET RECOMMENDATION 11/28/2018 SLP Diet Recommendations NPO Liquid Administration via -- Medication Administration Via alternative means Compensations -- Postural Changes --   CHL IP OTHER RECOMMENDATIONS 11/28/2018 Recommended Consults -- Oral Care Recommendations Oral care QID Other Recommendations --   CHL IP FOLLOW UP RECOMMENDATIONS 11/28/2018 Follow up Recommendations Skilled Nursing facility   [facility] IP FREQUENCY AND DURATION 11/28/2018 Speech Therapy Frequency (ACUTE ONLY) min 1 x/week Treatment Duration 1 week      CHL IP ORAL PHASE 11/28/2018 Oral Phase Impaired Oral - Pudding Teaspoon -- Oral - Pudding Cup -- Oral - Honey Teaspoon -- Oral - Honey Cup Holding of bolus;Pocketing in anterior sulcus;Left anterior bolus loss Oral - Nectar Teaspoon -- Oral - Nectar Cup -- Oral - Nectar Straw -- Oral - Thin Teaspoon -- Oral - Thin Cup Left anterior bolus loss;Holding of bolus Oral - Thin Straw Other (Comment) Oral - Puree Holding of bolus;Pocketing in anterior sulcus;Left anterior bolus loss Oral - Mech Soft -- Oral - Regular -- Oral - Multi-Consistency -- Oral - Pill -- Oral Phase - Comment --  CHL IP PHARYNGEAL PHASE 11/28/2018 Pharyngeal Phase (No Data) Pharyngeal- Pudding Teaspoon -- Pharyngeal -- Pharyngeal- Pudding Cup -- Pharyngeal -- Pharyngeal- Honey Teaspoon -- Pharyngeal -- Pharyngeal- Honey Cup -- Pharyngeal -- Pharyngeal- Nectar Teaspoon -- Pharyngeal -- Pharyngeal- Nectar Cup -- Pharyngeal -- Pharyngeal- Nectar Straw -- Pharyngeal -- Pharyngeal- Thin Teaspoon -- Pharyngeal -- Pharyngeal- Thin Cup -- Pharyngeal -- Pharyngeal- Thin Straw -- Pharyngeal -- Pharyngeal- Puree -- Pharyngeal -- Pharyngeal- Mechanical Soft -- Pharyngeal -- Pharyngeal- Regular -- Pharyngeal -- Pharyngeal- Multi-consistency --  Pharyngeal -- Pharyngeal- Pill -- Pharyngeal -- Pharyngeal Comment --  CHL IP CERVICAL ESOPHAGEAL PHASE 11/28/2018 Cervical Esophageal Phase (No Data) Pudding Teaspoon -- Pudding Cup -- Honey Teaspoon -- Honey Cup -- Nectar Teaspoon -- Nectar Cup -- Nectar Straw -- Thin Teaspoon -- Thin Cup -- Thin Straw -- Puree -- Mechanical Soft -- Regular -- Multi-consistency -- Pill -- Cervical Esophageal Comment -- Houston Siren 11/28/2018, 2:24 PM  Orbie Pyo Litaker  M.Ed Actor Pager [phone] Office [phone]              Microbiology: Recent Results (from the past 240 hour(s))  SARS CORONAVIRUS 2 (TAT 6-24 HRS) Nasopharyngeal Nasopharyngeal Swab     Status: None   Collection Time: 12/18/18  2:42 PM   Specimen: Nasopharyngeal Swab  Result Value Ref Range Status   SARS Coronavirus 2 NEGATIVE NEGATIVE Final    Comment: (NOTE) SARS-CoV-2 target nucleic acids are NOT DETECTED. The SARS-CoV-2 RNA is generally detectable in upper and lower respiratory specimens during the acute phase of infection. Negative results do not preclude SARS-CoV-2 infection, do not rule out co-infections with other pathogens, and should not be used as the sole basis for treatment or other patient management decisions. Negative results must be combined with clinical observations, patient history, and epidemiological information. The expected result is Negative. Fact Sheet for Patients: SugarRoll.be Fact Sheet for Healthcare Providers: https://www.woods-mathews.com/ This test is not yet approved or cleared by the [place] FDA and  has been authorized for detection and/or diagnosis of SARS-CoV-2 by FDA under an Emergency Use Authorization (EUA). This EUA will remain  in effect (meaning this test can be used) for the duration of the COVID-19 declaration under Section 56 4(b)(1) of the Act, 21 U.S.C. section 360bbb-3(b)(1), unless the authorization is  terminated or revoked sooner. Performed at [facility], [place] [address] [place], [place] [address]   Urine Culture     Status: Abnormal   Collection Time: 12/18/18  2:56 PM   Specimen: Urine, Catheterized  Result Value Ref Range Status   Specimen Description URINE, CATHETERIZED  Final   Special Requests   Final    NONE Performed at [facility], [place] [address], [place], [place] [address]    Culture (A)  Final    >=100,000 COLONIES/mL PSEUDOMONAS AERUGINOSA >=100,000 COLONIES/mL ENTEROCOCCUS FAECALIS    Report Status 12/21/2018 FINAL  Final   Organism ID, Bacteria PSEUDOMONAS AERUGINOSA (A)  Final   Organism ID, Bacteria ENTEROCOCCUS FAECALIS (A)  Final      Susceptibility   Enterococcus faecalis - MIC*    AMPICILLIN <=2 SENSITIVE Sensitive     LEVOFLOXACIN 2 SENSITIVE Sensitive     NITROFURANTOIN <=16 SENSITIVE Sensitive     VANCOMYCIN 1 SENSITIVE Sensitive     * >=100,000 COLONIES/mL ENTEROCOCCUS FAECALIS   Pseudomonas aeruginosa - MIC*    CEFTAZIDIME 2 SENSITIVE Sensitive     CIPROFLOXACIN <=0.25 SENSITIVE Sensitive     GENTAMICIN 2 SENSITIVE Sensitive     IMIPENEM 2 SENSITIVE Sensitive     PIP/TAZO 16 SENSITIVE Sensitive     CEFEPIME 2 SENSITIVE Sensitive     * >=100,000 COLONIES/mL PSEUDOMONAS AERUGINOSA     Labs: Basic Metabolic Panel: Recent Labs  Lab 12/20/18 2122 12/21/18 1618 12/23/18 0316 12/25/18 0342 12/26/18 0251  NA 142 143 141 143 141  K 3.1* 3.5 3.5 3.7 4.1  CL 114* 117* 118* 111 110  CO2 17* 17* 17* 23 24  GLUCOSE 182* 242* 191* 204* 134*  BUN 28* 15 12 11 11   CREATININE 0.97 0.94 0.85 0.74 0.64  CALCIUM 7.9* 7.9* 7.7* 8.2* 8.0*  MG  --   --   --  1.6* 1.9   Liver Function Tests: Recent Labs  Lab 12/25/18 0342  AST 35  ALT 50*  ALKPHOS 57  BILITOT 0.4  PROT 5.2*  ALBUMIN 1.8*   No results for input(s): LIPASE, AMYLASE in the last  168 hours. No results for input(s): AMMONIA in the last 168 hours. CBC: Recent Labs    Lab 12/21/18 2150 12/23/18 0316 12/24/18 0807 12/25/18 0342 12/26/18 0251  WBC 8.5 7.8 8.4 8.9 6.5  NEUTROABS  --  5.7 6.2 6.7 4.4  HGB 10.1* 9.9* 9.4* 9.4* 9.2*  HCT 30.7* 32.2* 29.5* 29.4* 29.2*  MCV 83.4 86.1 85.8 84.7 84.6  PLT 307 227 249 228 216   Cardiac Enzymes: No results for input(s): CKTOTAL, CKMB, CKMBINDEX, TROPONINI in the last 168 hours. BNP: BNP (last 3 results) No results for input(s): BNP in the last 8760 hours.  ProBNP (last 3 results) No results for input(s): PROBNP in the last 8760 hours.  CBG: Recent Labs  Lab 12/25/18 2022 12/26/18 0023 12/26/18 0258 12/26/18 0810 12/26/18 1139  GLUCAP 213* 194* 126* 208* 126*    Active Problems:   Urinary tract bacterial infections   Pressure injury of skin   Time coordinating discharge: 38 minutes.  Signed:        Binyamin Nelis, DO [facility]  12/26/2018, 4:31 PM

## 2018-12-26 NOTE — Care Management Important Message (Signed)
Important Message  Patient Details  Name: [name] MRN: [id number] Date of Birth: 22-Jul-1940   Medicare Important Message Given:  Yes     Shelda Altes 12/26/2018, 3:48 PM

## 2018-12-26 NOTE — TOC Transition Note (Signed)
Transition of Care [facility]) - CM/SW Discharge Note Marvetta Gibbons RN, BSN Transitions of Care Unit 4E- RN Case Manager [phone]   Patient Details  Name: [name] MRN: [id number] Date of Birth: 1940-07-06  Transition of Care [facility]) CM/SW Contact:  Dawayne Patricia, RN Phone Number: 12/26/2018, 1:45 PM   Clinical Narrative:    Pt stable for transition home today, Have spoken with son regarding home TF and request for continuous feedings- f/u done with MD and Jenifer with RD. Recommendations made for home TF continuous feeds- order placed for feeding pump per MD. Per son pt is active with [facility] for [facility] services and he would like to continue these, [employer] was providing TF for home. Call made to [facility] with [facility] for resumption of [facility] services- RN/PT/OT/SLP/aide/SW. CM also contacted [employer] for TF change- spoke with Juliann Pulse at the [facility]- who will pull new orders and RD note from Epic to process new home TF needs- per Juliann Pulse they are short on drivers today and will have to deliver pump and supplies tomorrow. Son informed and states that he does have supplies to do bolus feeds for tonight until pump arrives. Per son he will plan to transport pt home via car.    Final next level of care: [place] Barriers to Discharge: No Barriers Identified, Barriers Resolved   Patient Goals and CMS Choice Patient states their goals for this hospitalization and ongoing recovery are:: return home CMS Medicare.gov Compare Post Acute Care list provided to:: Patient Represenative (must comment)(son) Choice offered to / list presented to : Adult Children  Discharge Placement           Home with [facility]            Discharge Plan and Services   Discharge Planning Services: CM Consult Post Acute Care Choice: Durable Medical Equipment, Home Health, Resumption of Svcs/PTA Provider          DME Arranged: Tube feeding pump, Tube feeding DME  Agency: Other - Comment Date DME Agency Contacted: 12/26/18 Time DME Agency Contacted: 30 Representative spoke with at DME Agency: Juliann Pulse HH Arranged: RN, PT, OT, Speech Therapy, Nurse's Aide, Social Work [facility] Agency: [facility] Date [place]: 12/26/18 Time [place]: 1305 Representative spoke with at [place]: [place] ([place]) Interventions     Readmission Risk Interventions Readmission Risk Prevention Plan 12/26/2018  Transportation Screening Complete  PCP or Specialist Appt within 3-5 Days Complete  HRI or [place] Complete  Social Work Consult for [place] Planning/Counseling [place] Not Applicable  Medication Review Press photographer) Complete  Some recent data might be hidden

## 2018-12-26 NOTE — Progress Notes (Signed)
Son at bedside upon d/c.

## 2018-12-26 NOTE — Evaluation (Signed)
Clinical/Bedside Swallow Evaluation Patient Details  Name: [name] MRN: [id number] Date of Birth: 18-Aug-1940  Today's Date: 12/26/2018 Time: SLP Start Time (ACUTE ONLY): [id number] SLP Stop Time (ACUTE ONLY): 0853 SLP Time Calculation (min) (ACUTE ONLY): 14 min  Past Medical History:  Past Medical History:  Diagnosis Date  . Arthritis   . CAD (coronary artery disease)   . Carotid artery occlusion   . Diabetes mellitus   . Hyperlipidemia   . Hypertension   . Myocardial infarction ([place])   . Stroke [facility])    Past Surgical History:  Past Surgical History:  Procedure Laterality Date  . CAROTID ENDARTERECTOMY  01/06/2007   right - with Dacron patch angioplasty  . ESOPHAGOGASTRODUODENOSCOPY N/A 12/01/2018   Procedure: ESOPHAGOGASTRODUODENOSCOPY (EGD);  Surgeon: Georganna Skeans, MD;  Location: [facility] ENDOSCOPY;  Service: General;  Laterality: N/A;  . HERNIA REPAIR  2012  . IR CT HEAD LTD  11/20/2018  . IR PERCUTANEOUS ART THROMBECTOMY/INFUSION INTRACRANIAL INC DIAG ANGIO  11/20/2018  . PEG PLACEMENT N/A 12/01/2018   Procedure: PERCUTANEOUS ENDOSCOPIC GASTROSTOMY (PEG) PLACEMENT;  Surgeon: Georganna Skeans, MD;  Location: [place];  Service: General;  Laterality: N/A;  . RADIOLOGY WITH ANESTHESIA N/A 11/20/2018   Procedure: CODE STROKE;  Surgeon: Luanne Bras, MD;  Location: [place];  Service: Radiology;  Laterality: N/A;   HPI:  Pt is a 78 yo female with recent CVA that resulted in significant dysphagia. During MBS on 11/28/18, pt was not able to orally transit any consistency bolus to initiate a pharyngeal swallow. Pt was discharged with a PEG. She was admitted 9/28 with AMS and increasing weakness. SLP was asked to re-evaluate swallowing as per chart, son says she has not been tolerating her bolus feeds well. PMH: CVA, MI, HTN, HLD, DM, CAD, arthritis   Assessment / Plan / Recommendation Clinical Impression  Pt clinically appears to have minimal change since MBS completed earlier  this month, at which time pt was recommended to be NPO as she could not orally transit any boluses to trigger a swallow. Today she has a mouth open posture, which allows SLP to place spoon/straw in her mouth passively. Max cues are needed throughout the swallowing process, prompting her to close her lips, suck on the straw, initiate posterior transit. She elicited a swallow only 2 times with thin liquids, which elicited a prolonged cough response. Otherwise, boluses sat in her oral cavity, primarily in and around her anterior sulcus. She is not ready for a PO diet and even if POs could be safely attempted, I think her intake would not be sufficient given how laborious swallowing is for her. Would only consider comfort feeds at this point if family wants her to eat, as aspiration risk would be high; otherwise, would keep pt NPO with use of PEG. Could consider a trial of [place] SLP to see if pt can make any progress toward swallowing more.   SLP Visit Diagnosis: Dysphagia, oropharyngeal phase (R13.12)    Aspiration Risk  Severe aspiration risk    Diet Recommendation NPO;Alternative means - long-term   Medication Administration: Via alternative means    Other  Recommendations Oral Care Recommendations: Oral care QID Other Recommendations: Have oral suction available   Follow up Recommendations Home health SLP;24 hour supervision/assistance      Frequency and Duration min 2x/week  2 weeks       Prognosis Prognosis for Safe Diet Advancement: Fair Barriers to Reach Goals: Severity of deficits;Cognitive deficits      Swallow  Study   General HPI: Pt is a 78 yo female with recent CVA that resulted in significant dysphagia. During MBS on 11/28/18, pt was not able to orally transit any consistency bolus to initiate a pharyngeal swallow. Pt was discharged with a PEG. She was admitted 9/28 with AMS and increasing weakness. SLP was asked to re-evaluate swallowing as per chart, son says she has not been  tolerating her bolus feeds well. PMH: CVA, MI, HTN, HLD, DM, CAD, arthritis Type of Study: Bedside Swallow Evaluation Previous Swallow Assessment: see HPI Diet Prior to this Study: NPO;PEG tube Temperature Spikes Noted: No Respiratory Status: Room air History of Recent Intubation: No Behavior/Cognition: Alert;Requires cueing Oral Care Completed by SLP: No Oral Cavity - Dentition: Edentulous Vision: (left inattention/neglect) Self-Feeding Abilities: Total assist Patient Positioning: Upright in bed Baseline Vocal Quality: Normal Volitional Cough: Cognitively unable to elicit Volitional Swallow: Unable to elicit    Oral/Motor/Sensory Function Overall Oral Motor/Sensory Function: (L-sided impairment s/p recent CVA)   Ice Chips Ice chips: Impaired Presentation: Spoon Oral Phase Impairments: Poor awareness of bolus;Reduced labial seal;Reduced lingual movement/coordination Oral Phase Functional Implications: Oral holding Pharyngeal Phase Impairments: Unable to trigger swallow   Thin Liquid Thin Liquid: Impaired Presentation: Spoon;Straw Oral Phase Impairments: Reduced labial seal;Reduced lingual movement/coordination;Poor awareness of bolus Oral Phase Functional Implications: Oral holding;Other (comment)(anterior spillage) Pharyngeal  Phase Impairments: Cough - Immediate    Nectar Thick Nectar Thick Liquid: Not tested   Honey Thick Honey Thick Liquid: Not tested   Puree Puree: Impaired Presentation: Spoon Oral Phase Impairments: Reduced labial seal;Reduced lingual movement/coordination;Poor awareness of bolus Oral Phase Functional Implications: Oral residue;Other (comment)(oral residue) Pharyngeal Phase Impairments: Unable to trigger swallow   Solid     Solid: Not tested      Venita Sheffield Orlanda Frankum 12/26/2018,9:04 AM  Pollyann Glen, M.A. [place] Acute Environmental education officer [phone] Office [phone]

## 2018-12-26 NOTE — Progress Notes (Signed)
Nutrition Follow-up  RD working remotely.  DOCUMENTATION CODES:   Severe malnutrition in context of chronic illness, Underweight  INTERVENTION:   -Continue Osmolite 1.2 @ 55 ml/hr via PEG  -Continue 200 ml free water flush 4 times daily  Tube feeding regimen provides 1584kcal (100% of needs), 73 grams of protein, and 1882 ml of H2O.   -TF recommendations for home:  -Continuous feedings of Osmolite 1.2 @ 55 ml/hr via PEG -200 ml free water flush 4 times daily  Tube feeding regimen provides 1584kcal (100% of needs), 73 grams of protein, and 1882 ml of H2O.   NUTRITION DIAGNOSIS:   Severe Malnutrition related to chronic illness(CVA) as evidenced by severe fat depletion, severe muscle depletion.  Ongoing  GOAL:   Patient will meet greater than or equal to 90% of their needs  Met with TF  MONITOR:   Labs, Weight trends, TF tolerance, Skin, I & O's  REASON FOR ASSESSMENT:   Consult Enteral/tube feeding initiation and management  ASSESSMENT:   [name] is a 78 y.o. female with medical history significant of CAD, bilateral carotid artery stenosis status post right carotid enterectomy, myocardial infarction, hypertension, mitral valve insufficiency, atrial fibrillation on Eliquis, recent embolic stroke involving the right MCA status post TPA and unsuccessful IR who presented with altered mental status and increasing weakness.  Son at bedside provides history as patient has incoherent speech.  Patient was recently admitted from 8/31 to 9/12 with mechanical ventilation secondary to acute hypoxia respiratory failure with findings of embolic stroke involving the right MCA status post TPA and unsuccessful IR, and ultimately requiring PEG tube placement.  Since returning home, patient has had residual left-sided weakness and slurred speech at baseline.  Son reports that he was still talking to her and getting her up out of bed daily.  However, about 2 days ago she became more  nonverbal and was sleeping a lot more and had 2 episodes of diarrhea prompting him to bring her into the emergency department today for further evaluation  9/29- TF held secondary to possible GIB 10/2- TF restarted 10/6- s/p BSE- recommend continue NPO with nutrition support via PEG  Reviewed I/O's: +1.7 L x 24 hours and +5.3 L since admission  Received page from [facility]. Spoke with RNCM over phone, who reports that pt son would like to transition to continuous feedings at home, as she tolerated continuous feeds better than bolus feedings at home previously. RNCM requesting home TF reccomendations for home.   RNCM reports pt did not do well on swallow evaluation and plan is to continue NPO with TF support. Per SLP notes, pt with minimal improvement from MBSS last month.   Pt receiving Osmolite 1.2 @ 55 ml/hr with 200 ml free water flush 4 times per day. Regimen providing 1584kcal, 73 grams of protein, and 1882 ml of H2O, meeting 100% of estimated kcal and protein needs. Pt continues to tolerate TF well.   Noted weight gain this admission, which is favorable given pt's severe malnutrition.   Labs reviewed: CBGS: [phone] (inpatient orders for glycemic control are 0-9 units insulin aspart every 4 hours).   Diet Order:   Diet Order            Diet NPO time specified  Diet effective now              EDUCATION NEEDS:   No education needs have been identified at this time  Skin:  Skin Assessment: Skin Integrity Issues: Skin Integrity Issues::  Stage II Stage II: sacrum  Last BM:  12/26/18  Height:   Ht Readings from Last 1 Encounters:  12/26/18 _0  (1.549 m)    Weight:   Wt Readings from Last 1 Encounters:  12/26/18 44.4 kg    Ideal Body Weight:  47.7 kg  BMI:  Body mass index is 18.5 kg/m.  Estimated Nutritional Needs:   Kcal:  1400-1600  Protein:  65-80 grams  Fluid:  > 1.4 L    Rahm Minix A. Jimmye Norman, RD, LDN, [place] Registered Dietitian II Certified Diabetes  Care and Education Specialist Pager: [phone] After hours Pager: [phone]

## 2018-12-29 ENCOUNTER — Telehealth: Payer: Self-pay | Admitting: Physician Assistant

## 2018-12-29 ENCOUNTER — Encounter: Payer: Self-pay | Admitting: Physician Assistant

## 2018-12-29 NOTE — Telephone Encounter (Signed)
Entered in error

## 2018-12-29 NOTE — Progress Notes (Signed)
Cardiology Office Note    Date:  01/01/2019   ID:  DYAMOND PONGRACZ, DOB 07/18/1940, MRN [id number]  PCP:  Eloy End, MD  Cardiologist:  Ena Dawley, MD  Electrophysiologist:  None   Chief Complaint: f/u PAF, cardiomyopathy  History of Present Illness:   [place] is a 78 y.o. female with history of CAD with potential prior MI, chronic combined CHF/cardiomyopathy, bilateral carotid artery stenosis with left ICA occlsuion and right carotid endarterectomy (2008), prior CVA, HTN, HLD, DM, and paroxysmal atrial fibrillation who presents for post-hospital f/u.   Ms. Deger history is a bit unclear. She had previously been followed by another practice through [facility] in [place] as well as Dr. Lennox Pippins in 2017. There was possible history of remote MI 20 years ago but unclear whether intervention took place. She had a CVA possibly around 2015 with residual hearing impairment in her left ear. Notes reveal an echocardiogram in the past with an EF of 30%, but not delineated at that time if ICM vs NICM. She has also followed with a vascular surgery team for prior R CEA and known LICA occlusion. She was recently admitted in 11/2018 with severe acute stroke. She was found to be very hypertensive and with paroxysmal atrial fibrillation. CT head showed R M1 occlusion and family decided to proceed with thrombectomy/TPA. She was intubated prior to the procedure. Unfortunately, revascularization was unsuccessful. Hospital course complicated by dysphagia requiring PEG tube, PSVT, PAT, NSVT. 2D echo 11/21/18 showed EF 35-40%, mild LVH, pseudonormalization, mild LAE. Medical therapy was recommended for her cardiomyopathy, being careful with permissive HTN in the setting of her known carotid disease. She was placed on metoprolol and irbesartan. Her CT of the head also showed a right cavernous ICA aneurysm. It appears she was cleared for Eliquis. She was admitted 9/28-10/6 with lethargy, diarrhea and  becoming more nonverbal. She was noted to have worsening anemia, +FOBT, hypernatremia, mild AKI, and hypoglycemia. She was evaluated by GI. Per DC summary, they did not feel that GI Bleed was the source of her anemia, and they did not recommend any invasive endoscopic procedures. The patient received 2 units of PRBCs and PPI. She was continued on ASA + Eliquis at discharge by internal medicine. Entresto, HCTZ, irbesartan were discontinued off MAR in setting of hypotension. (She was not taking Entresto recently; this was a holdover from a prior med list.) Last labs showed K 4.1, Cr 0.64, Hgb 9.2, albumin 1.8, AST 35, ALT 50, TSH wnl, LDL 72.  She returns for follow-up today with her son [name]. She is generally nonverbal with persistent right sided gaze but does answer a few questions with brief answers and is able to follow basic commands. Fortunately she states she's feeling "pretty good." [name] has noticed she's regaining more function of her tongue/mouth and left foot. However, she is still quite debilitated and receiving home health services. He notices a brief occasional panting spell but generally no lasting dyspnea. No chest pain, orthopnea, edema, or palpitations reported. Her BP remains at the lower end of normal.  Past Medical History:  Diagnosis Date   Anemia, unspecified    Aneurysm ([place])    a. 11/2018 CT of the head also showed a right cavernous ICA aneurysm   Arthritis    CAD (coronary artery disease)    Cardiomyopathy ([facility])    Carotid artery occlusion    Chronic combined systolic and diastolic CHF (congestive heart failure) (H[facility])    Diabetes mellitus  Dysphagia    Hyperlipidemia    Hypertension    Myocardial infarction [facility])    NSVT (nonsustained ventricular tachycardia) (H[facility])    PAF (paroxysmal atrial fibrillation) (H[facility])    PAT (paroxysmal atrial tachycardia) (H[facility])    PSVT (paroxysmal supraventricular tachycardia) ([place])    Status post insertion of percutaneous  endoscopic gastrostomy (PEG) tube ([place])    Stroke [facility])     Past Surgical History:  Procedure Laterality Date   CAROTID ENDARTERECTOMY  01/06/2007   right - with Dacron patch angioplasty   ESOPHAGOGASTRODUODENOSCOPY N/A 12/01/2018   Procedure: ESOPHAGOGASTRODUODENOSCOPY (EGD);  Surgeon: Georganna Skeans, MD;  Location: [place];  Service: General;  Laterality: N/A;   HERNIA REPAIR  2012   IR CT HEAD LTD  11/20/2018   IR PERCUTANEOUS ART THROMBECTOMY/INFUSION INTRACRANIAL INC DIAG ANGIO  11/20/2018   PEG PLACEMENT N/A 12/01/2018   Procedure: PERCUTANEOUS ENDOSCOPIC GASTROSTOMY (PEG) PLACEMENT;  Surgeon: Georganna Skeans, MD;  Location: [place];  Service: General;  Laterality: N/A;   RADIOLOGY WITH ANESTHESIA N/A 11/20/2018   Procedure: CODE STROKE;  Surgeon: Luanne Bras, MD;  Location: [place];  Service: Radiology;  Laterality: N/A;    Current Medications: Current Meds  Medication Sig   apixaban (ELIQUIS) 5 MG TABS tablet Place 1 tablet (5 mg total) into feeding tube every 12 (twelve) hours. Crush med prior to putting in tube and administer promptly   aspirin 81 MG chewable tablet Place 1 tablet (81 mg total) into feeding tube daily. Crush med prior to putting in tube   chlorhexidine (PERIDEX) 0.12 % solution 15 mLs by Mouth Rinse route 2 (two) times daily.   dicyclomine (BENTYL) 10 MG capsule Place 1 capsule (10 mg total) into feeding tube 4 (four) times daily as needed for spasms. Crush med prior to putting in tube   loratadine (CLARITIN) 5 MG/5ML syrup Take 5 mg by mouth daily as needed for allergies or rhinitis. Place 39mls into feeding tube twice per day   metFORMIN (GLUCOPHAGE) 850 MG tablet Take 850 mg by mouth 2 (two) times daily with a meal. Place in feeding tube twice per day   metoprolol tartrate (LOPRESSOR) 25 mg/10 mL SUSP Place 30 mLs (75 mg total) into feeding tube 2 (two) times daily.   Nutritional Supplements (FEEDING SUPPLEMENT, OSMOLITE 1.2 CAL,)  LIQD Place 1,000 mLs into feeding tube continuous.   pantoprazole sodium (PROTONIX) 40 mg/20 mL PACK Place 20 mLs (40 mg total) into feeding tube at bedtime.   simvastatin (ZOCOR) 20 MG tablet Place 1 tablet (20 mg total) into feeding tube at bedtime. Crush med prior to putting in tube   Water For Irrigation, Sterile (FREE WATER) SOLN Place 200 mLs into feeding tube every 4 (four) hours.     Allergies:   Patient has no known allergies.   Social History   Socioeconomic History   Marital status: Widowed    Spouse name: Not on file   Number of children: Not on file   Years of education: Not on file   Highest education level: Not on file  Occupational History   Not on file  Social Needs   Financial resource strain: Not on file   Food insecurity    Worry: Not on file    Inability: Not on file   Transportation needs    Medical: Not on file    Non-medical: Not on file  Tobacco Use   Smoking status: Former Smoker    Packs/day: 1.00    Types: Cigarettes  Quit date: 03/23/1987    Years since quitting: 31.8   Smokeless tobacco: Never Used  Substance and Sexual Activity   Alcohol use: No   Drug use: Not on file   Sexual activity: Not on file  Lifestyle   Physical activity    Days per week: Not on file    Minutes per session: Not on file   Stress: Not on file  Relationships   Social connections    Talks on phone: Not on file    Gets together: Not on file    Attends religious service: Not on file    Active member of club or organization: Not on file    Attends meetings of clubs or organizations: Not on file    Relationship status: Not on file  Other Topics Concern   Not on file  Social History Narrative   Not on file     Family History:  The patient's family history includes Diabetes in her brother and mother; Heart disease in her brother and mother.  ROS:   Please see the history of present illness.  All other systems are reviewed and otherwise  negative but difficult to obtain given pt's mental status/nonverbal   EKGs/Labs/Other Studies Reviewed:    Studies reviewed were summarized above.   EKG:  EKG is ordered today, personally reviewed, demonstrating NSR 69bpm, one PVC vs fusion beat, possible LAE, cannot exclude prior inferior infarct, nonspecific STT changes, QTc 473ms. Similar to prior.  Recent Labs: 11/23/2018: TSH 0.974 12/25/2018: ALT 50 12/26/2018: BUN 11; Creatinine, Ser 0.64; Hemoglobin 9.2; Magnesium 1.9; Platelets 216; Potassium 4.1; Sodium 141  Recent Lipid Panel    Component Value Date/Time   CHOL 124 11/21/2018 0538   TRIG 106 11/23/2018 0221   HDL 39 (L) 11/21/2018 0538   CHOLHDL 3.2 11/21/2018 0538   VLDL 13 11/21/2018 0538   LDLCALC 72 11/21/2018 0538    PHYSICAL EXAM:    VS:  BP (!) 104/54    Pulse 69    Ht 5\' 1"  (1.549 m)    Wt 110 lb (49.9 kg)    SpO2 96%    BMI 20.78 kg/m   BMI: Body mass index is 20.78 kg/m.  GEN: Cachectic appearing AAF in no acute distress HEENT: normocephalic, atraumatic Neck: no JVD, carotid bruits, or masses Cardiac: RRR; no murmurs, rubs, or gallops, no edema  Respiratory:  clear to auscultation bilaterally, normal work of breathing GI: soft, nontender, nondistended, + BS MS: generalized atrophy noted Skin: warm and dry, no rash Neuro:  Alert with persistent right sided deviated gaze, answer questions with brief 1-2 word answers, knows her son, + L sided persistent weakness since stroke, does follow basic commands Psych: flat affect  Wt Readings from Last 3 Encounters:  01/01/19 110 lb (49.9 kg)  12/26/18 97 lb 14.2 oz (44.4 kg)  12/02/18 97 lb 14.2 oz (44.4 kg)     ASSESSMENT & PLAN:   1. Paroxysmal atrial fibrillation - maintaining NSR on Lopressor suspension via tube. Continue Eliquis as tolerated. 2. Chronic combined CHF - Reviewed 2g sodium restriction, 2L fluid restriction, daily weights with patient. Weight is up in clinic today but suspect related to type  of clothing she is wearing. She otherwise appears euvolemic. Unfortunately her BP simply remains too low to advance her medication regimen at this time. Would not aggressively pursue this given her known carotid disease as decreasing BP further could contribute to worsened cerebral hypoperfusion. Continue Lopressor as tolerated (chosen due to suspension  form). 3. NSVT - quiescent by symptoms. No syncope. Check BMET + Mg today. 4. Potential h/o CAD - no recent anginal symptoms. 5. Anemia - recheck CBC today. Encouraged ongoing f/u with primary care for this. If she remains anemic, may need to consider discontinuation of aspirin - will defer to neurology.  Disposition: F/u with Dr. Nelson/me in 4 months. Son confirms he prefers to stay with [facility] office. I also encouraged he continue to maintain ongoing f/u with her vascular team as well as neurology. It's not clear to me who is managing her PEG tube so I asked him to touch base with [facility] to confirm.   Medication Adjustments/Labs and Tests Ordered: Current medicines are reviewed at length with the patient today.  Concerns regarding medicines are outlined above. Medication changes, Labs and Tests ordered today are summarized above and listed in the Patient Instructions accessible in Encounters.   Signed, Charlie Pitter, PA-C  01/01/2019 12:23 PM    [facility] [place], [place], [place]  [address] Phone: [phone]; Fax: [phone]

## 2019-01-01 ENCOUNTER — Other Ambulatory Visit: Payer: Self-pay

## 2019-01-01 ENCOUNTER — Ambulatory Visit (INDEPENDENT_AMBULATORY_CARE_PROVIDER_SITE_OTHER): Payer: Medicare Other | Admitting: Physician Assistant

## 2019-01-01 ENCOUNTER — Encounter: Payer: Self-pay | Admitting: Physician Assistant

## 2019-01-01 VITALS — BP 104/54 | HR 69 | Ht 61.0 in | Wt 110.0 lb

## 2019-01-01 DIAGNOSIS — D649 Anemia, unspecified: Secondary | ICD-10-CM

## 2019-01-01 DIAGNOSIS — I472 Ventricular tachycardia: Secondary | ICD-10-CM | POA: Diagnosis not present

## 2019-01-01 DIAGNOSIS — I5042 Chronic combined systolic (congestive) and diastolic (congestive) heart failure: Secondary | ICD-10-CM

## 2019-01-01 DIAGNOSIS — I2510 Atherosclerotic heart disease of native coronary artery without angina pectoris: Secondary | ICD-10-CM

## 2019-01-01 DIAGNOSIS — I4729 Other ventricular tachycardia: Secondary | ICD-10-CM

## 2019-01-01 DIAGNOSIS — I639 Cerebral infarction, unspecified: Secondary | ICD-10-CM

## 2019-01-01 DIAGNOSIS — I480 Paroxysmal atrial fibrillation: Secondary | ICD-10-CM | POA: Diagnosis not present

## 2019-01-01 LAB — CBC
Hematocrit: 29.7 % — ABNORMAL LOW (ref 34.0–46.6)
Hemoglobin: 9.3 g/dL — ABNORMAL LOW (ref 11.1–15.9)
MCH: 25.8 pg — ABNORMAL LOW (ref 26.6–33.0)
MCHC: 31.3 g/dL — ABNORMAL LOW (ref 31.5–35.7)
MCV: 83 fL (ref 79–97)
Platelets: 331 10*3/uL (ref 150–450)
RBC: 3.6 x10E6/uL — ABNORMAL LOW (ref 3.77–5.28)
RDW: 16.3 % — ABNORMAL HIGH (ref 11.7–15.4)
WBC: 8 10*3/uL (ref 3.4–10.8)

## 2019-01-01 LAB — MAGNESIUM: Magnesium: 1.7 mg/dL (ref 1.6–2.3)

## 2019-01-01 LAB — BASIC METABOLIC PANEL
BUN/Creatinine Ratio: 28 (ref 12–28)
BUN: 18 mg/dL (ref 8–27)
CO2: 23 mmol/L (ref 20–29)
Calcium: 9.1 mg/dL (ref 8.7–10.3)
Chloride: 102 mmol/L (ref 96–106)
Creatinine, Ser: 0.64 mg/dL (ref 0.57–1.00)
GFR calc Af Amer: 100 mL/min/{1.73_m2} (ref >59)
GFR calc non Af Amer: 87 mL/min/{1.73_m2} (ref >59)
Glucose: 129 mg/dL — ABNORMAL HIGH (ref 65–99)
Potassium: 4.6 mmol/L (ref 3.5–5.2)
Sodium: 139 mmol/L (ref 134–144)

## 2019-01-01 NOTE — Patient Instructions (Addendum)
Medication Instructions:  Your physician recommends that you continue on your current medications as directed. Please refer to the Current Medication list given to you today.  If you need a refill on your cardiac medications before your next appointment, please call your pharmacy.   Lab work: TODAY:  BMET, CBC, & MAGNESIUM  If you have labs (blood work) drawn today and your tests are completely normal, you will receive your results only by: Marland Kitchen MyChart Message (if you have MyChart) OR . A paper copy in the mail If you have any lab test that is abnormal or we need to change your treatment, we will call you to review the results.  Testing/Procedures: None ordered  Follow-Up: At [facility], you and your health needs are our priority.  As part of our continuing mission to provide you with exceptional heart care, we have created designated Provider Care Teams.  These Care Teams include your primary Cardiologist (physician) and Advanced Practice Providers (APPs -  Physician Assistants and Nurse Practitioners) who all work together to provide you with the care you need, when you need it. You will need a follow up appointment in 4 months.  Please call our office 2 months in advance to schedule this appointment.  You may see Ena Dawley, MD or one of the following Advanced Practice Providers on your designated Care Team:   [place], PA-C Melina Copa, PA-C . Ermalinda Barrios, PA-C  Any Other Special Instructions Will Be Listed Below (If Applicable).   For patients with congestive heart failure, we give them these special instructions:  1. Follow a low-salt diet - you are allowed no more than 2,000mg  of sodium per day. Watch your fluid intake. In general, you should not be taking in more than 2 liters of fluid per day (no more than 8 glasses per day). This includes sources of water in foods like soup, coffee, tea, milk, etc. 2. Weigh yourself on the same scale at same time of day and keep  a log. 3. Call your doctor: (Anytime you feel any of the following symptoms)  - 3lb weight gain overnight or 5lb within a few days - Shortness of breath, with or without a dry hacking cough  - Swelling in the hands, feet or stomach  - If you have to sleep on extra pillows at night in order to breathe   IT IS IMPORTANT TO LET YOUR DOCTOR KNOW EARLY ON IF YOU ARE HAVING SYMPTOMS SO WE CAN HELP YOU!   Please call [facility] at [phone] to ask if they are comfortable managing her feeding tube or if she should also have followup with a gastroenterologist.

## 2019-01-02 ENCOUNTER — Telehealth: Payer: Self-pay | Admitting: *Deleted

## 2019-01-02 MED ORDER — MAGNESIUM OXIDE 400 240 MG PO PACK: 400.0000 mg | PACK | Freq: Every day | ORAL | 1 refills | Status: AC

## 2019-01-02 NOTE — Telephone Encounter (Signed)
-----   Message from Charlie Pitter, [place] sent at 01/02/2019  9:04 AM EDT ----- Anderson Malta, please call Ms. Kishi/son [name] (pt is generally nonverbal). Labs are all stable except magnesium level is a little below desired baseline. Would suggest magnesium oxide in packet form 400mg  daily (see pharmD note) to be administered via feeding tube once daily. Recheck Mg level in 2 weeks.  Dayna Dunn PA-C

## 2019-01-18 ENCOUNTER — Telehealth: Payer: Self-pay | Admitting: Adult Health

## 2019-01-18 ENCOUNTER — Encounter: Payer: Self-pay | Admitting: Adult Health

## 2019-01-18 ENCOUNTER — Ambulatory Visit (INDEPENDENT_AMBULATORY_CARE_PROVIDER_SITE_OTHER): Payer: Medicare Other | Admitting: Adult Health

## 2019-01-18 ENCOUNTER — Other Ambulatory Visit: Payer: Self-pay

## 2019-01-18 VITALS — BP 114/49 | HR 71 | Temp 97.2°F | Ht 61.0 in

## 2019-01-18 DIAGNOSIS — G8114 Spastic hemiplegia affecting left nondominant side: Secondary | ICD-10-CM

## 2019-01-18 DIAGNOSIS — I63411 Cerebral infarction due to embolism of right middle cerebral artery: Secondary | ICD-10-CM

## 2019-01-18 DIAGNOSIS — E785 Hyperlipidemia, unspecified: Secondary | ICD-10-CM

## 2019-01-18 DIAGNOSIS — I639 Cerebral infarction, unspecified: Secondary | ICD-10-CM

## 2019-01-18 DIAGNOSIS — R414 Neurologic neglect syndrome: Secondary | ICD-10-CM

## 2019-01-18 DIAGNOSIS — I6523 Occlusion and stenosis of bilateral carotid arteries: Secondary | ICD-10-CM

## 2019-01-18 DIAGNOSIS — R1312 Dysphagia, oropharyngeal phase: Secondary | ICD-10-CM | POA: Diagnosis not present

## 2019-01-18 DIAGNOSIS — E119 Type 2 diabetes mellitus without complications: Secondary | ICD-10-CM

## 2019-01-18 DIAGNOSIS — R471 Dysarthria and anarthria: Secondary | ICD-10-CM

## 2019-01-18 DIAGNOSIS — Z931 Gastrostomy status: Secondary | ICD-10-CM

## 2019-01-18 DIAGNOSIS — I480 Paroxysmal atrial fibrillation: Secondary | ICD-10-CM

## 2019-01-18 DIAGNOSIS — I10 Essential (primary) hypertension: Secondary | ICD-10-CM

## 2019-01-18 MED ORDER — BACLOFEN 5 MG PO TABS: 5.0000 mg | ORAL_TABLET | Freq: Two times a day (BID) | ORAL | 3 refills | Status: AC

## 2019-01-18 MED ORDER — BACLOFEN 5 MG/5ML PO SOLN: 5.0000 mg | Freq: Two times a day (BID) | ORAL | 3 refills | Status: DC

## 2019-01-18 NOTE — Progress Notes (Signed)
I agree with the above plan 

## 2019-01-18 NOTE — Patient Instructions (Addendum)
You will be called to schedule nutrition consult for tube feed  Continuation of home health therapies with possible transition to outpatient therapies once completed  Continue aspirin 81 mg daily and Eliquis (apixaban) daily  and simvastatin for secondary stroke prevention  Start baclofen 5 mg twice daily for spasticity  Follow up with cardiology as scheduled   Continue to follow up with PCP regarding cholesterol, blood pressure and diabetes management   Continue to monitor blood pressure at home  Maintain strict control of hypertension with blood pressure goal below 130/90, diabetes with hemoglobin A1c goal below 6.5% and cholesterol with LDL cholesterol (bad cholesterol) goal below 70 mg/dL. I also advised the patient to eat a healthy diet with plenty of whole grains, cereals, fruits and vegetables, exercise regularly and maintain ideal body weight.  Followup in the future with me in 3 months or call earlier if needed       Thank you for coming to see [place] at [facility]. I hope we have been able to provide you high quality care today.  You may receive a patient satisfaction survey over the next few weeks. We would appreciate your feedback and comments so that we may continue to improve ourselves and the health of our patients.

## 2019-01-18 NOTE — Telephone Encounter (Signed)
Ulice Dash from [facility] called in and stated that the Baclofen 5 MG/5ML SOLN that was sent in was a compound and they dont provide them at the pharmacy , he stated they can do it in a tablet and the patient crushes it to place in tube.

## 2019-01-18 NOTE — Addendum Note (Signed)
Addended by: Mal Misty on: 01/18/2019 12:50 PM   Modules accepted: Orders

## 2019-01-18 NOTE — Progress Notes (Signed)
[facility] [address] [place]. [place] [address] [phone]       HOSPITAL FOLLOW UP NOTE  Ms. [name] Date of Birth:  August 23, 1940 Medical Record Number:  [id number]   Reason for Referral:  hospital stroke follow up    CHIEF COMPLAINT:  Chief Complaint  Patient presents with   Follow-up    Treatment Room, with son. States she has been doing better.    HPI: [name] being seen today for in office hospital follow-up regarding right MCA stroke status post TPA and unsuccessful IR due to new diagnosis of A. fib on 11/20/2018.  History obtained from patient, son and chart review. Reviewed all radiology images and labs personally.  Ms.[name] a 78 y.o.femalewith history of myocardial infarction, hypertension, hyperlipidemia, diabetes, carotid artery occlusion on the left, CAD and arthritispresented on 11/20/2018 withL sided neglect, R eyedeviation and L side flaccid and found to be in hypertensive urgency with BP as high as 222/128. CT head showed hyperdense right MCA.  Received tPA 11/20/2018 at 1545. CTA showed R M1 occlusion with poor flow right M2 and right M3, right CEA 2008 with patent bifurcation, severe left ICA bifurcation arthrosclerosis with occlusion left ICA.Marland Kitchen  Cerebral angio showed occluded right M1 with unsuccessful revascularization after 2 passes with Endo Chapman penumbra aspiration due to underlying heart arthrosclerosis and vascular tortuosity.  Post IR CT unremarkable.  MRI showed right MCA infarct insular and frontoparietal along with old right cerebellar, left occipital, left frontal parietal and vertex infarcts.  2D echo EF of 35 to 40% without cardiac source embolus identified.  LDL 72.  A1c 6.6.  New diagnosis of atrial fibrillation as evidenced on telemetry monitoring and initiated Eliquis for secondary stroke prevention.  Hypertensive emergency treated with Cleviprex and recommended long-term BP goal 130-150 given  right ICA occlusion.  Continuation of simvastatin 20 mg daily.  Controlled DM and recommend close PCP follow-up.  Other stroke risk factors include left ICA occlusion, prior stroke on imaging, history of CEA 2008, former tobacco use and CAD.  Other active problems include urinary retention, hypokalemia and dysphagia s/p PEG placement.  Therapies recommended SNF placement but family declined and was discharged home with son with recommendation of home health therapies.  Ms. [name] is being seen today for hospital follow-up accompanied by her son.  She returned to ED on 12/18/2018 due to worsening speech and increased lethargy along with 2 episodes of diarrhea.  During admission, hemoglobin dropped from 7.8-6.1 with positive FOBT.  Evaluated by GI did not feel as though GI bleed source of anemia.  Transfusion required and placed on Protonix.  Restarted tube feeds with continuous feeds versus previous bolus feeding.  Eliquis restarted on 12/23/2018 with improvement of her hemoglobin levels.  She also had episode of hypoglycemia.  She was discharged back home in stable condition.   Residual stroke deficits of left spastic hemiplegia, left-sided neglect severe dysarthria, dysphagia and cognitive impairment.  Continues to receive home health therapies with ongoing improvement.  She is nonambulatory but recently was able to stand with assistance of therapy.  Son does endorse improvement of speech and possibly swallowing.  She will be undergoing barium swallowing study next week for potential start of diet.  Continues to receive nutrition and medications through PEG.  She has been tolerating continuous feeds.  Son questions ongoing management of tube feeds.  Apparently, they were not referred to any nutrition services at discharge.  Mild cognitive impairment with occasional hallucinations  and difficulty following directions but this has overall been improving.  She continues to live with her son who is her primary caregiver  at this time.  Continues on Eliquis and aspirin without additional concerns of bleeding or bruising.  Continues on simvastatin without myalgias.  Blood pressure today 114/49.  No further concerns at this time.     ROS:   14 system review of systems performed and negative with exception of shortness of breath, trouble swallowing, confusion, weakness and ambulation difficulty  PMH:  Past Medical History:  Diagnosis Date   Anemia, unspecified    Aneurysm ([place])    a. 11/2018 CT of the head also showed a right cavernous ICA aneurysm   Arthritis    CAD (coronary artery disease)    Cardiomyopathy ([place])    Carotid artery occlusion    Chronic combined systolic and diastolic CHF (congestive heart failure) (HCC)    Diabetes mellitus    Dysphagia    Hyperlipidemia    Hypertension    Myocardial infarction (HCC)    NSVT (nonsustained ventricular tachycardia) (HCC)    PAF (paroxysmal atrial fibrillation) (HCC)    PAT (paroxysmal atrial tachycardia) (HCC)    PSVT (paroxysmal supraventricular tachycardia) ([place])    Status post insertion of percutaneous endoscopic gastrostomy (PEG) tube ([place])    Stroke ([place])     PSH:  Past Surgical History:  Procedure Laterality Date   CAROTID ENDARTERECTOMY  01/06/2007   right - with Dacron patch angioplasty   ESOPHAGOGASTRODUODENOSCOPY N/A 12/01/2018   Procedure: ESOPHAGOGASTRODUODENOSCOPY (EGD);  Surgeon: Georganna Skeans, MD;  Location: [place];  Service: General;  Laterality: N/A;   HERNIA REPAIR  2012   IR CT HEAD LTD  11/20/2018   IR PERCUTANEOUS ART THROMBECTOMY/INFUSION INTRACRANIAL INC DIAG ANGIO  11/20/2018   PEG PLACEMENT N/A 12/01/2018   Procedure: PERCUTANEOUS ENDOSCOPIC GASTROSTOMY (PEG) PLACEMENT;  Surgeon: Georganna Skeans, MD;  Location: [facility];  Service: General;  Laterality: N/A;   RADIOLOGY WITH ANESTHESIA N/A 11/20/2018   Procedure: CODE STROKE;  Surgeon: Luanne Bras, MD;  Location: [place];  Service:  Radiology;  Laterality: N/A;    Social History:  Social History   Socioeconomic History   Marital status: Widowed    Spouse name: Not on file   Number of children: Not on file   Years of education: Not on file   Highest education level: Not on file  Occupational History   Not on file  Social Needs   Financial resource strain: Not on file   Food insecurity    Worry: Not on file    Inability: Not on file   Transportation needs    Medical: Not on file    Non-medical: Not on file  Tobacco Use   Smoking status: Former Smoker    Packs/day: 1.00    Types: Cigarettes    Quit date: 03/23/1987    Years since quitting: 31.8   Smokeless tobacco: Never Used  Substance and Sexual Activity   Alcohol use: No   Drug use: Not on file   Sexual activity: Not on file  Lifestyle   Physical activity    Days per week: Not on file    Minutes per session: Not on file   Stress: Not on file  Relationships   Social connections    Talks on phone: Not on file    Gets together: Not on file    Attends religious service: Not on file    Active member of club or organization: Not  on file    Attends meetings of clubs or organizations: Not on file    Relationship status: Not on file   Intimate partner violence    Fear of current or ex partner: Not on file    Emotionally abused: Not on file    Physically abused: Not on file    Forced sexual activity: Not on file  Other Topics Concern   Not on file  Social History Narrative   Not on file    Family History:  Family History  Problem Relation Age of Onset   Heart disease Mother    Diabetes Mother    Heart disease Brother    Diabetes Brother     Medications:   Current Outpatient Medications on File Prior to Visit  Medication Sig Dispense Refill   apixaban (ELIQUIS) 5 MG TABS tablet Place 1 tablet (5 mg total) into feeding tube every 12 (twelve) hours. Crush med prior to putting in tube and administer promptly 60 tablet  2   aspirin 81 MG chewable tablet Place 1 tablet (81 mg total) into feeding tube daily. Crush med prior to putting in tube 30 tablet 2   chlorhexidine (PERIDEX) 0.12 % solution 15 mLs by Mouth Rinse route 2 (two) times daily. 120 mL 0   dicyclomine (BENTYL) 10 MG capsule Place 1 capsule (10 mg total) into feeding tube 4 (four) times daily as needed for spasms. Crush med prior to putting in tube 60 capsule 2   loratadine (CLARITIN) 5 MG/5ML syrup Take 5 mg by mouth daily as needed for allergies or rhinitis. Place 4mls into feeding tube twice per day     Magnesium Oxide (MAGNESIUM OXIDE 400) 240 MG PACK Take 400 mg by mouth daily. 90 each 1   metFORMIN (GLUCOPHAGE) 850 MG tablet Take 850 mg by mouth 2 (two) times daily with a meal. Place in feeding tube twice per day     metoprolol tartrate (LOPRESSOR) 25 mg/10 mL SUSP Place 30 mLs (75 mg total) into feeding tube 2 (two) times daily. 1800 mL 0   Nutritional Supplements (FEEDING SUPPLEMENT, OSMOLITE 1.2 CAL,) LIQD Place 1,000 mLs into feeding tube continuous. 1000 mL 0   pantoprazole sodium (PROTONIX) 40 mg/20 mL PACK Place 20 mLs (40 mg total) into feeding tube at bedtime. 600 mL 2   simvastatin (ZOCOR) 20 MG tablet Place 1 tablet (20 mg total) into feeding tube at bedtime. Crush med prior to putting in tube 30 tablet 2   Water For Irrigation, Sterile (FREE WATER) SOLN Place 200 mLs into feeding tube every 4 (four) hours. 200 mL 0   No current facility-administered medications on file prior to visit.     Allergies:  No Known Allergies   Physical Exam  Vitals:   01/18/19 0808  BP: (!) 114/49  Pulse: 71  Temp: (!) 97.2 F (36.2 C)  Height: 5\' 1"  (1.549 m)   Body mass index is 20.78 kg/m. No exam data present  General: frail pleasant elderly female, seated, in no evident distress Head: head normocephalic and atraumatic.   Neck: supple with no carotid or supraclavicular bruits Cardiovascular: regular rate and rhythm, no  murmurs Musculoskeletal: no deformity Skin:  no rash/petichiae Vascular:  Normal pulses all extremities   Neurologic Exam Mental Status: Awake and fully alert.   Severe dysarthria with minimal speech.  Difficulty following multiple step commands likely due to cognitive impairment.  Unable to adequately assess orientation.  Left-sided neglect with right-sided gaze preference.  Mood and  affect appropriate.  Cranial Nerves: Fundoscopic exam unable to perform due to uncooperation.  Pupils equal, briskly reactive to light. Extraocular movements full without nystagmus.  Right-sided gaze preference but is able to cross midline.  Visual fields blinks to threat. Hearing intact. Facial sensation intact.  Left lower facial weakness Motor:  LUE: 0/5 with increased spasticity LLE: 0/5 with increased tone Full strength right upper and lower extremities Sensory.:  Sensation appears intact to light touch and painful stimuli Coordination: Rapid alternating movements normal on right side. Finger-to-nose and heel-to-shin unable to perform due to patient not understanding directions. Gait and Station: Gait assessment deferred as nonambulatory Reflexes: 2+ LUE and LLE ; 1+ RUE and RLE.     NIHSS  14 Modified Rankin  4 CHA2DS2-VASc 8 HAS-BLED 3   Diagnostic Data (Labs, Imaging, Testing)  Ct Head Code Stroke Wo Contrast 11/20/2018 1. Subtle hyperdense right MCA. There is occlusion of the right distal M1 on CTA today. 2. ASPECTS is 10 3. No acute infarct or hemorrhage   Ct Angio Head W Or Wo Contrast Ct Angio Neck W Or Wo Contrast 11/20/2018 1. Occlusion right M1 segment distally with poor flow in the right M2 and M3 branches. 2. Postop right carotid endarterectomy 2008. Right carotid bifurcation widely patent 3. Severe atherosclerotic disease left carotid bifurcation with occlusion of the left internal carotid artery. 4. Left anterior and left middle cerebral arteries appear patent without significant  stenosis through collateral flow through the anterior communicating artery. 5. Posterior circulation intact   Cerebral Angiogram 11/20/2018 1.occluded Rt MCA distal M1 seg. Unsuccessful revascularization Despite x 2 passes with 54mm xc 50mm embotrap retriever device and penumbra aspiration due to underlying hard ICAD. Unsuccessful intracranial rescue stenting dut vascular tortuosity.  Mr Brain Wo Contrast 11/21/2018 8 cm confluent region of infarction in the right middle cerebral artery territory affecting the insula and frontoparietal brain. Mild swelling but no evidence of hemorrhage at this time or of significant mass effect or any shift. Old infarctions within the right cerebellum, left occipital lobe and at the left frontoparietal vertex.   Ct Head Wo Contrast 11/25/2018 Subacute large RIGH T MCA territory infarct with 3 mm of RIGHT to LEFT midline shift, new since 11/20/2018 but consistent with large subacute infarct. Underlying atrophy with small vessel chronic ischemic changes of deep cerebral white matter. No additional new intracranial abnormalities.  Dg Chest Port 1 View 11/20/2018 1. The endotracheal tube is borderline low. Repositioning should be considered. 2. Cardiomegaly with mild vascular congestion.  11/21/2018 Endotracheal tube in satisfactory position. No acute abnormality noted.  11/25/2018 1. Interval extubation and placement of an enteric tube coursing below the diaphragm with the distal tip out of field of view. 2. Chronic bilateral increased bronchial markings likely chronic bronchitis. No definite new focal infiltrate. 3. Small area of lucency at the right lung base is likely artifactual. Consider repeat radiograph, preferably PA and lateral radiographs, to exclude small pneumothorax.  2D Echocardiogram 11/21/2018 1. Akinesis of the left ventricular, entire inferior wall and inferolateral wall. 2. The left ventricle has moderately reduced systolic function,  with an ejection fraction of 35-40%.The cavity size was normal. There is mild concentric left ventricular hypertrophy. Left ventricular diastolic Doppler parameters are consistent with pseudonormalization. Elevated mean left atrial pressure. 3. The right ventricle has normal systolic function. The cavity was normal. There is no increase in right ventricular wall thickness. Right ventricular systolic pressure could not be assessed. 4. Left atrial size was mildly dilated. 5. There  is mild mitral annular calcification present. 6. The aorta is normal unless otherwise noted.  EEG-evidence of a right hemispheric dysfunction which is consistent with the patient's known right hemispheric infarct.   ASSESSMENT: ADALINN CROCHET is a 78 y.o. year old female presented with left-sided neglect, right eye deviation and left hemiplegia on 11/20/2018 with stroke work-up revealing right MCA insular/frontoparietal infarct due to right ICA and MCA occlusion status post TPA and unsuccessful IR embolic pattern secondary to newly diagnosed A. fib. Vascular risk factors include HTN, HLD, prior stroke on imaging, DM, bilateral carotid artery stenosis, new dx of atrial fibrillation, cardiomyopathy, CAD.  Residual deficits of left hemiplegia, left-sided neglect, right gaze preference, severe dysarthria, dysphagia and likely cognitive impairment.    PLAN:  1. Right MCA stroke: Continue aspirin 81 mg daily and Eliquis (apixaban) daily  and simvastatin for secondary stroke prevention. Maintain strict control of hypertension with blood pressure goal below 130/90, diabetes with hemoglobin A1c goal below 6.5% and cholesterol with LDL cholesterol (bad cholesterol) goal below 70 mg/dL.  I also advised the patient to eat a healthy diet with plenty of whole grains, cereals, fruits and vegetables, exercise regularly with at least 30 minutes of continuous activity daily and maintain ideal body weight. 2. Residual deficits as  above: Continuation of home health therapies and likely transition to outpatient therapies once completed.  Undergo swallowing evaluation next week for possible start of oral intake.  Initiate baclofen 5 mg/5 mL twice daily via PEG tube for increased spasticity and pain of left side.  Discussed potential side effects with son and to call after 2 weeks with potential need of increase 3. PEG tube w tube feeding: Referral placed to nutrition for further management and recommendation 4. Atrial fibrillation: Continuation of Eliquis and ongoing follow-up with cardiology 5. HTN: Advised to continue current treatment regimen.  Today's BP 114/49.  Advised to continue to monitor at home along with continued follow-up with PCP for management 6. HLD: Advised to continue current treatment regimen along with continued follow-up with PCP for future prescribing and monitoring of lipid panel 7. DMII: Advised to continue to monitor glucose levels at home along with continued follow-up with PCP for management and monitoring 8. Carotid artery stenosis: Continue to follow with vascular surgery for routine monitoring.  Continuation of aspirin.    Follow up in 3 months or call earlier if needed   Greater than 50% of time during this 45 minute visit was spent on counseling, explanation of diagnosis of right MCA stroke, reviewing risk factor management of HTN, HLD, DM, carotid stenosis and atrial fibrillation, planning of further management along with potential future management, and discussion with patient and family answering all questions.    Frann Rider, AGNP-BC  [facility] [address] [place], [place] [address]  Phone [phone] Fax [phone] Note: This document was prepared with digital dictation and possible smart phrase technology. Any transcriptional errors that result from this process are unintentional.

## 2019-01-18 NOTE — Telephone Encounter (Signed)
Looks like this was done by JM/NP.

## 2019-02-18 ENCOUNTER — Emergency Department (HOSPITAL_COMMUNITY): Payer: Medicare Other

## 2019-02-18 ENCOUNTER — Emergency Department (HOSPITAL_COMMUNITY)
Admission: EM | Admit: 2019-02-18 | Discharge: 2019-02-20 | Disposition: E | Payer: Medicare Other | Attending: Emergency Medicine | Admitting: Emergency Medicine

## 2019-02-18 ENCOUNTER — Other Ambulatory Visit: Payer: Self-pay

## 2019-02-18 ENCOUNTER — Encounter (HOSPITAL_COMMUNITY): Payer: Self-pay | Admitting: *Deleted

## 2019-02-18 DIAGNOSIS — I2510 Atherosclerotic heart disease of native coronary artery without angina pectoris: Secondary | ICD-10-CM | POA: Insufficient documentation

## 2019-02-18 DIAGNOSIS — Z87891 Personal history of nicotine dependence: Secondary | ICD-10-CM | POA: Diagnosis not present

## 2019-02-18 DIAGNOSIS — I132 Hypertensive heart and chronic kidney disease with heart failure and with stage 5 chronic kidney disease, or end stage renal disease: Secondary | ICD-10-CM | POA: Insufficient documentation

## 2019-02-18 DIAGNOSIS — I469 Cardiac arrest, cause unspecified: Secondary | ICD-10-CM | POA: Diagnosis not present

## 2019-02-18 DIAGNOSIS — Z7901 Long term (current) use of anticoagulants: Secondary | ICD-10-CM | POA: Insufficient documentation

## 2019-02-18 DIAGNOSIS — Z8673 Personal history of transient ischemic attack (TIA), and cerebral infarction without residual deficits: Secondary | ICD-10-CM | POA: Diagnosis not present

## 2019-02-18 DIAGNOSIS — Z7982 Long term (current) use of aspirin: Secondary | ICD-10-CM | POA: Diagnosis not present

## 2019-02-18 DIAGNOSIS — R4701 Aphasia: Secondary | ICD-10-CM | POA: Diagnosis present

## 2019-02-18 DIAGNOSIS — I63522 Cerebral infarction due to unspecified occlusion or stenosis of left anterior cerebral artery: Secondary | ICD-10-CM | POA: Diagnosis not present

## 2019-02-18 DIAGNOSIS — N186 End stage renal disease: Secondary | ICD-10-CM | POA: Diagnosis not present

## 2019-02-18 DIAGNOSIS — I5042 Chronic combined systolic (congestive) and diastolic (congestive) heart failure: Secondary | ICD-10-CM | POA: Insufficient documentation

## 2019-02-18 DIAGNOSIS — E1122 Type 2 diabetes mellitus with diabetic chronic kidney disease: Secondary | ICD-10-CM | POA: Diagnosis not present

## 2019-02-18 DIAGNOSIS — I63322 Cerebral infarction due to thrombosis of left anterior cerebral artery: Secondary | ICD-10-CM | POA: Diagnosis not present

## 2019-02-18 LAB — CBC
HCT: 20.7 % — ABNORMAL LOW (ref 36.0–46.0)
Hemoglobin: 4.9 g/dL — CL (ref 12.0–15.0)
MCH: 22 pg — ABNORMAL LOW (ref 26.0–34.0)
MCHC: 23.7 g/dL — ABNORMAL LOW (ref 30.0–36.0)
MCV: 92.8 fL (ref 80.0–100.0)
Platelets: 415 10*3/uL — ABNORMAL HIGH (ref 150–400)
RBC: 2.23 MIL/uL — ABNORMAL LOW (ref 3.87–5.11)
RDW: 18.4 % — ABNORMAL HIGH (ref 11.5–15.5)
WBC: 19.6 10*3/uL — ABNORMAL HIGH (ref 4.0–10.5)
nRBC: 1.9 % — ABNORMAL HIGH (ref 0.0–0.2)

## 2019-02-18 LAB — COMPREHENSIVE METABOLIC PANEL
ALT: 18 U/L (ref 0–44)
AST: 31 U/L (ref 15–41)
Albumin: 2.4 g/dL — ABNORMAL LOW (ref 3.5–5.0)
Alkaline Phosphatase: 52 U/L (ref 38–126)
BUN: 26 mg/dL — ABNORMAL HIGH (ref 8–23)
CO2: <7 mmol/L — ABNORMAL LOW (ref 22–32)
Calcium: 9.4 mg/dL (ref 8.9–10.3)
Chloride: 105 mmol/L (ref 98–111)
Creatinine, Ser: 1.04 mg/dL — ABNORMAL HIGH (ref 0.44–1.00)
GFR calc Af Amer: >60 mL/min (ref >60)
GFR calc non Af Amer: 52 mL/min — ABNORMAL LOW (ref >60)
Glucose, Bld: 505 mg/dL (ref 70–99)
Potassium: 5.7 mmol/L — ABNORMAL HIGH (ref 3.5–5.1)
Sodium: 138 mmol/L (ref 135–145)
Total Bilirubin: 0.3 mg/dL (ref 0.3–1.2)
Total Protein: 5.6 g/dL — ABNORMAL LOW (ref 6.5–8.1)

## 2019-02-18 LAB — I-STAT CHEM 8, ED
BUN: 26 mg/dL — ABNORMAL HIGH (ref 8–23)
Calcium, Ion: 1.24 mmol/L (ref 1.15–1.40)
Chloride: 108 mmol/L (ref 98–111)
Creatinine, Ser: 0.9 mg/dL (ref 0.44–1.00)
Glucose, Bld: 479 mg/dL — ABNORMAL HIGH (ref 70–99)
HCT: 19 % — ABNORMAL LOW (ref 36.0–46.0)
Hemoglobin: 6.5 g/dL — CL (ref 12.0–15.0)
Potassium: 5.6 mmol/L — ABNORMAL HIGH (ref 3.5–5.1)
Sodium: 136 mmol/L (ref 135–145)
TCO2: 8 mmol/L — ABNORMAL LOW (ref 22–32)

## 2019-02-18 LAB — APTT: aPTT: 51 seconds — ABNORMAL HIGH (ref 24–36)

## 2019-02-18 LAB — DIFFERENTIAL
Abs Immature Granulocytes: 0.23 10*3/uL — ABNORMAL HIGH (ref 0.00–0.07)
Basophils Absolute: 0 10*3/uL (ref 0.0–0.1)
Basophils Relative: 0 %
Eosinophils Absolute: 0 10*3/uL (ref 0.0–0.5)
Eosinophils Relative: 0 %
Immature Granulocytes: 1 %
Lymphocytes Relative: 59 %
Lymphs Abs: 11.5 10*3/uL — ABNORMAL HIGH (ref 0.7–4.0)
Monocytes Absolute: 2.3 10*3/uL — ABNORMAL HIGH (ref 0.1–1.0)
Monocytes Relative: 12 %
Neutro Abs: 5.5 10*3/uL (ref 1.7–7.7)
Neutrophils Relative %: 28 %

## 2019-02-18 LAB — PROTIME-INR
INR: 2.7 — ABNORMAL HIGH (ref 0.8–1.2)
Prothrombin Time: 28.6 seconds — ABNORMAL HIGH (ref 11.4–15.2)

## 2019-02-18 MED ORDER — EPINEPHRINE 1 MG/10ML IJ SOSY
PREFILLED_SYRINGE | INTRAMUSCULAR | Status: AC | PRN
  Administered 2019-02-18: 1 via INTRAVENOUS

## 2019-02-18 MED ORDER — SODIUM CHLORIDE 0.9% FLUSH: 3.0000 mL | Freq: Once | INTRAVENOUS | Status: DC

## 2019-02-18 MED ORDER — EPINEPHRINE 1 MG/10ML IJ SOSY
PREFILLED_SYRINGE | INTRAMUSCULAR | Status: AC | PRN
  Administered 2019-02-18 (×2): 1 via INTRAVENOUS

## 2019-02-18 MED FILL — Medication: Qty: 1 | Status: AC

## 2019-02-20 NOTE — Code Documentation (Signed)
No pulses.  CPR continued.

## 2019-02-20 NOTE — ED Notes (Signed)
Pt breathing pattern changing.  RR slowing and pt not responding to painful stimuli and not grasping my arm as she had been when I was attempting to place IV.  Pt placed on monitor and HR 39.  MD notified.

## 2019-02-20 NOTE — ED Notes (Signed)
Korie Hover (Daughter#[phone]) called for an update.

## 2019-02-20 NOTE — Consult Note (Signed)
Responded to [place], but no family  then  present in  CT. Later responded to nurse's request to join pt's son [name]  bedside in ED after she expired in CT. Provided emotional/spiritual support and prayer and gave him small pocket grief  heart keepsakes for him &  2 sisters, al of which he appreciated. Please page again if further need of chaplain services.   Rev. Eloise Levels Chaplain

## 2019-02-20 NOTE — Code Documentation (Signed)
Pulses present.  Son stated pt's wished to be a DNR. Verified by records per Dr Leonel Ramsay.

## 2019-02-20 NOTE — ED Triage Notes (Signed)
PT here from home via [facility].  LSN 0800 when son gave pt for breakfast.  At 0825 he heard noise in her room, and pt was diaphoretic, nonverbal, but did follow commands.  Pt has L sided paralysis from stroke in September.  Vs 96%, rr 26, b[ 112/58, cbg 309.  EMS states pt did follow commands, but pt not following commands upon arrival.

## 2019-02-20 NOTE — Consult Note (Signed)
Neurology Consultation Reason for Consult: Stroke Referring Physician: Vanita Panda, R  CC: Aphasia  History is obtained from: EMS  HPI: [name] is a 78 y.o. female with a history of recent stroke causing left hemiplegia who presented to the hospital today after her son found her this morning unable to talk.  Last known well was last night, because I am not certain that she was really her normal self when he talked her this morning.  On arrival she was aphasic with right leg weakness and was taken for CT scan which does reveal subacute infarct.  While awaiting the CT angiogram, she became bradycardic and subsequently suffered a PEA arrest.  Her ACP documents were reviewed, and this specific situation and cardiac arrest was slightly unclear based on the wording of the documents, so chest compressions were initiated while I try to contact her son.  I was able to contact her son prior to intubation who indicated that she was indeed DNR and therefore further efforts at resuscitation were stopped.   LKW: 11/28 prior to bed tpa given?: no, out of window   ROS: Unable to obtain due to altered mental status.   Past Medical History:  Diagnosis Date  . Anemia, unspecified   . Aneurysm ([place])    a. 11/2018 CT of the head also showed a right cavernous ICA aneurysm  . Arthritis   . CAD (coronary artery disease)   . Cardiomyopathy ([place])   . Carotid artery occlusion   . Chronic combined systolic and diastolic CHF (congestive heart failure) ([place])   . Diabetes mellitus   . Dysphagia   . Hyperlipidemia   . Hypertension   . Myocardial infarction ([place])   . NSVT (nonsustained ventricular tachycardia) ([place])   . PAF (paroxysmal atrial fibrillation) ([place])   . PAT (paroxysmal atrial tachycardia) ([place])   . PSVT (paroxysmal supraventricular tachycardia) ([place])   . Status post insertion of percutaneous endoscopic gastrostomy (PEG) tube ([place])   . Stroke [facility])      Family History  Problem Relation Age  of Onset  . Heart disease Mother   . Diabetes Mother   . Heart disease Brother   . Diabetes Brother      Social History:  reports that she quit smoking about 31 years ago. Her smoking use included cigarettes. She smoked 1.00 pack per day. She has never used smokeless tobacco. She reports that she does not drink alcohol. No history on file for drug.   Exam: Current vital signs: Vitals:   February 23, 2019 1010 02-23-19 1019  BP: (!) 49/20   Pulse: (!) 32 (!) 0  Resp: (!) 6   SpO2: (!) 86%     Vital signs in last 24 hours:     Physical Exam  Constitutional: Appears chronically ill Psych: Does not answer questions Eyes: No scleral injection HENT: No OP obstrucion MSK: no joint deformities.  Cardiovascular: Normal rate and regular rhythm.  Respiratory: Effort normal, non-labored breathing GI: PEG tube in place Skin: WDI  Neuro: Mental Status: Patient is awake but aphasic. Cranial Nerves: II: She does not blink to threat from either direction but she does appear to fixate the examiner pupils are equal, round, and reactive to light.   III,IV, VI: I do not see her cross midline to the right, but she does look over towards the left and then back to midline V: VII: Facial movement is symmetric.  Motor: She has spastic left hemiparesis the arm and leg, her right leg is completely  flaccid with no movement, she has 4+/5 strength of the right arm.   Sensory: She responds to noxious stimulation in the right arm  Cerebellar: Does not perform  I have reviewed the images obtained: CT head-left ACA territory infarct  Impression: 78 year old female who presented with left ACA territory infarct.  I am not certain why she abruptly underwent cardiac arrest, but given that she is DNR further attempts at resuscitation were stopped.  She subsequently passed away.  This patient is critically ill and at significant risk of neurological worsening, death and care requires constant monitoring of vital  signs, hemodynamics,respiratory and cardiac monitoring, neurological assessment, discussion with family, other specialists and medical decision making of high complexity. I spent 45 minutes of neurocritical care time  in the care of  this patient. This was time spent independent of any time provided by nurse practitioner or PA.  Roland Rack, MD [facility] [phone]  If 7pm- 7am, please page neurology on call as listed in [place]. 03/07/19  12:08 PM

## 2019-02-20 NOTE — ED Notes (Signed)
Attempted blood draw on arrival. Unsuccessful completion.

## 2019-02-20 NOTE — ED Notes (Signed)
Chaplain speaking with son at bedside.

## 2019-02-20 NOTE — ED Provider Notes (Signed)
[facility] EMERGENCY DEPARTMENT Provider Note   CSN: [id number] Arrival date & time: 2019/03/18  [id number]     History   Chief Complaint No chief complaint on file.   HPI [name] is a 78 y.o. female.     HPI Patient presents as a code stroke. Patient has obvious aphasia on arrival, via EMS, is incapable of providing all details of her history, level 5 caveat. EMS reports the patient has multiple medical issues including prior stroke, with baseline left-sided hemiplegia, feeding tube dependency. Today, at 8 AM, 90 minutes ago the patient was reportedly in her usual state of health.  Approximately 30 minutes later the patient was found to have new aphasia, without reported change in left-sided hemiplegia, or any new right-sided deficits. EMS reports the patient was incapable of finding any details of her history, had a protected airway, was brought here emergently for evaluation of possible stroke. Reportedly, the patient has been taking her anticoagulant regularly, has been in her usual state of health until today. Past Medical History:  Diagnosis Date   Anemia, unspecified    Aneurysm ([place])    a. 11/2018 CT of the head also showed a right cavernous ICA aneurysm   Arthritis    CAD (coronary artery disease)    Cardiomyopathy (HCC)    Carotid artery occlusion    Chronic combined systolic and diastolic CHF (congestive heart failure) (HCC)    Diabetes mellitus    Dysphagia    Hyperlipidemia    Hypertension    Myocardial infarction (HCC)    NSVT (nonsustained ventricular tachycardia) (HCC)    PAF (paroxysmal atrial fibrillation) (HCC)    PAT (paroxysmal atrial tachycardia) (HCC)    PSVT (paroxysmal supraventricular tachycardia) ([place])    Status post insertion of percutaneous endoscopic gastrostomy (PEG) tube ([place])    Stroke [facility])     Patient Active Problem List   Diagnosis Date Noted   Pressure injury of skin 12/20/2018   Urinary  tract bacterial infections 12/18/2018   Altered mental status    Acute cystitis without hematuria    G tube feedings (HCC)    Atrial fibrillation with RVR ([place]) 11/28/2018   PSVT (paroxysmal supraventricular tachycardia) ([place]) 11/28/2018   Dysphagia 11/28/2018   Hypertensive urgency 11/28/2018   Hyperlipemia 11/28/2018   Cardiomyopathy ([place]) 11/28/2018   CAD (coronary artery disease) 11/28/2018   Advanced age 83/10/2018   Acute blood loss anemia 11/28/2018   Urinary retention 11/28/2018   Hypokalemia 11/28/2018   Protein-calorie malnutrition, severe [id number]   Embolic stroke involving right middle cerebral artery (HCC) s/p tPA and IR, d/t new AF 11/20/2018   Middle cerebral artery embolism, right 11/20/2018   Heart murmur 11/20/2018   Bilateral carotid artery stenosis 11/20/2018   Acute respiratory failure with hypoxia (HCC)    Encephalopathy acute    Type 2 diabetes mellitus without complication, without long-term current use of insulin (HCC)    End stage renal disease ([place]) 08/11/2011   Occlusion and stenosis of carotid artery without mention of cerebral infarction 08/11/2011    Past Surgical History:  Procedure Laterality Date   CAROTID ENDARTERECTOMY  01/06/2007   right - with Dacron patch angioplasty   ESOPHAGOGASTRODUODENOSCOPY N/A 12/01/2018   Procedure: ESOPHAGOGASTRODUODENOSCOPY (EGD);  Surgeon: Georganna Skeans, MD;  Location: [facility];  Service: General;  Laterality: N/A;   HERNIA REPAIR  2012   IR CT HEAD LTD  11/20/2018   IR PERCUTANEOUS ART THROMBECTOMY/INFUSION INTRACRANIAL INC DIAG ANGIO  11/20/2018  PEG PLACEMENT N/A 12/01/2018   Procedure: PERCUTANEOUS ENDOSCOPIC GASTROSTOMY (PEG) PLACEMENT;  Surgeon: Georganna Skeans, MD;  Location: [facility];  Service: General;  Laterality: N/A;   RADIOLOGY WITH ANESTHESIA N/A 11/20/2018   Procedure: CODE STROKE;  Surgeon: Luanne Bras, MD;  Location: [place];  Service: Radiology;   Laterality: N/A;     OB History   No obstetric history on file.      Home Medications    Prior to Admission medications   Medication Sig Start Date End Date Taking? Authorizing Provider  apixaban (ELIQUIS) 5 MG TABS tablet Place 1 tablet (5 mg total) into feeding tube every 12 (twelve) hours. Crush med prior to putting in tube and administer promptly 12/01/18   Donzetta Starch, NP  aspirin 81 MG chewable tablet Place 1 tablet (81 mg total) into feeding tube daily. Crush med prior to putting in tube 12/02/18   Donzetta Starch, NP  Baclofen 5 MG TABS 5 mg by PEG Tube route 2 (two) times daily. 01/18/19   Frann Rider, NP  chlorhexidine (PERIDEX) 0.12 % solution 15 mLs by Mouth Rinse route 2 (two) times daily. 12/26/18   Swayze, Ava, DO  dicyclomine (BENTYL) 10 MG capsule Place 1 capsule (10 mg total) into feeding tube 4 (four) times daily as needed for spasms. Crush med prior to putting in tube 12/02/18   Donzetta Starch, NP  loratadine (CLARITIN) 5 MG/5ML syrup Take 5 mg by mouth daily as needed for allergies or rhinitis. Place 71mls into feeding tube twice per day    [provider]  Magnesium Oxide (MAGNESIUM OXIDE 400) 240 MG PACK Take 400 mg by mouth daily. 01/02/19   Dunn, Nedra Hai, PA-C  metFORMIN (GLUCOPHAGE) 850 MG tablet Take 850 mg by mouth 2 (two) times daily with a meal. Place in feeding tube twice per day    [provider]  metoprolol tartrate (LOPRESSOR) 25 mg/10 mL SUSP Place 30 mLs (75 mg total) into feeding tube 2 (two) times daily. 12/26/18   Swayze, Ava, DO  pantoprazole sodium (PROTONIX) 40 mg/20 mL PACK Place 20 mLs (40 mg total) into feeding tube at bedtime. 12/02/18   Donzetta Starch, NP  simvastatin (ZOCOR) 20 MG tablet Place 1 tablet (20 mg total) into feeding tube at bedtime. Crush med prior to putting in tube 12/02/18   Donzetta Starch, NP  Water For Irrigation, Sterile (FREE WATER) SOLN Place 200 mLs into feeding tube every 4 (four) hours. 12/26/18   Swayze,  Ava, DO    Family History Family History  Problem Relation Age of Onset   Heart disease Mother    Diabetes Mother    Heart disease Brother    Diabetes Brother     Social History Social History   Tobacco Use   Smoking status: Former Smoker    Packs/day: 1.00    Types: Cigarettes    Quit date: 03/23/1987    Years since quitting: 31.9   Smokeless tobacco: Never Used  Substance Use Topics   Alcohol use: No   Drug use: Not on file     Allergies   Patient has no known allergies.   Review of Systems Review of Systems  Unable to perform ROS: Acuity of condition     Physical Exam Updated Vital Signs There were no vitals taken for this visit.  Physical Exam Vitals signs and nursing note reviewed.  Constitutional:      General: She is not in acute distress.  Appearance: She is ill-appearing.     Comments: Ill-appearing elderly female obvious aphasia, obvious atrophy  HENT:     Head: Normocephalic and atraumatic.  Eyes:     Conjunctiva/sclera: Conjunctivae normal.  Cardiovascular:     Rate and Rhythm: Normal rate and regular rhythm.  Pulmonary:     Effort: Pulmonary effort is normal. No respiratory distress.     Breath sounds: Normal breath sounds. No stridor.  Abdominal:     General: There is no distension.  Skin:    General: Skin is warm and dry.  Neurological:     Mental Status: She is alert.     Cranial Nerves: No cranial nerve deficit.     Comments: Gross aphasia present, patient with right-sided focus, does not follow commands consistently, has left-sided hemiplegia, does move the right side spontaneously.      ED Treatments / Results  Labs (all labs ordered are listed, but only abnormal results are displayed) Labs Reviewed  I-STAT CHEM 8, ED - Abnormal; Notable for the following components:      Result Value   Potassium 5.6 (*)    BUN 26 (*)    Glucose, Bld 479 (*)    TCO2 8 (*)    Hemoglobin 6.5 (*)    HCT 19.0 (*)    All other  components within normal limits  SARS CORONAVIRUS 2 BY RT PCR (HOSPITAL ORDER, [facility])  PROTIME-INR  APTT  CBC  DIFFERENTIAL  COMPREHENSIVE METABOLIC PANEL  CBG MONITORING, ED    EKG None obtained, patient had cardiac arrest prior to full twelve-lead evaluation. Radiology Ct Head Code Stroke Wo Contrast  Result Date: 03/11/2019 CLINICAL DATA:  Code stroke. History of right MCA stroke with resultant left-sided weakness. New right leg weakness and slurred speech today. EXAM: CT HEAD WITHOUT CONTRAST TECHNIQUE: Contiguous axial images were obtained from the base of the skull through the vertex without intravenous contrast. COMPARISON:  12/18/2018 FINDINGS: Brain: A large right MCA infarct demonstrates expected interval evolution, now chronic in appearance with development of encephalomalacia. No definite acute right cerebral infarct is identified, however it would be difficult to exclude recent ischemia near the margins of the chronic infarct. Hypoattenuation in the medulla and lower pons is felt to reflect beam hardening artifact, also present to varying degrees on multiple prior CTs without an abnormality on a 11/21/2018 MRI. Chronic left occipital and right cerebellar infarcts are again noted. There is an approximately 1 cm cortical infarct in the parasagittal left frontal lobe which is new and may be acute. A small chronic cortical infarct is present in the posterior left frontal lobe. No acute intracranial hemorrhage, mass, midline shift, or extra-axial fluid collection is identified. There is mild diffuse cerebral atrophy. Vascular: Calcified atherosclerosis at the skull base. No hyperdense vessel. Skull: No fracture or focal osseous lesion. Sinuses/Orbits: Visualized paranasal sinuses and mastoid air cells are clear. Right cataract extraction is noted. Other: None. ASPECTS [facility] [address] Stroke Program Early CT Score) Not scored given the presence of a large chronic right  MCA infarct. IMPRESSION: 1. New and potentially acute small infarct in the parasagittal left frontal lobe, left ACA territory. 2. Large chronic right MCA infarct. 3. Chronic left occipital, left frontal, and right cerebellar infarcts. 4. No acute intracranial hemorrhage. These results were communicated to Dr. Leonel Ramsay at 9:51 amon 12-20-2020by text page via the [facility] messaging system. Electronically Signed   By: Logan Bores M.D.   On: 11-Mar-2019 09:55  Procedures Procedures (including critical care time)  CRITICAL CARE Performed by: Carmin Muskrat Total critical care time: 35 minutes Critical care time was exclusive of separately billable procedures and treating other patients. Critical care was necessary to treat or prevent imminent or life-threatening deterioration. Critical care was time spent personally by me on the following activities: development of treatment plan with patient and/or surrogate as well as nursing, discussions with consultants, evaluation of patient's response to treatment, examination of patient, obtaining history from patient or surrogate, ordering and performing treatments and interventions, ordering and review of laboratory studies, ordering and review of radiographic studies, pulse oximetry and re-evaluation of patient's condition.   Medications Ordered in ED Medications  sodium chloride flush (NS) 0.9 % injection 3 mL (has no administration in time range)     Initial Impression / Assessment and Plan / ED Course  I have reviewed the triage vital signs and the nursing notes.  Pertinent labs & imaging results that were available during my care of the patient were reviewed by me and considered in my medical decision making (see chart for details).    Initial labs notable for hemoglobin 6.5, substantially down from baseline.   I was called to the CT suite due to cardiac arrest. Patient found to have asystole, requiring CPR.  After 2 rounds of CPR patient did  have return of spontaneous circulation.  During this episode our neurology colleague was able to speak with the patient's son on the phone, confirmed prior goals of care no intubation, no artificial life support. Subsequently the patient was returned to a regular exam room.  She was apneic, bradycardic.  Patient's son was brought back to the patient's room. 10:19 AM Time of death 32.  Patient accompanied by her son.  This elderly female with baseline left-sided hemiplegia, feeding tube dependency from prior strokes presents after developing new aphasia, is found to have CT evidence for new stroke, and during her evaluation had cardiac arrest. Patient's goals of care discussed with son, who was able to accompany the patient, while she had bradycardia, and expired.   Final Clinical Impressions(s) / ED Diagnoses   Final diagnoses:  Acute ischemic left ACA stroke [facility])  Cardiac arrest [facility])     Carmin Muskrat, MD 2019-03-01 1023

## 2019-02-20 NOTE — Code Documentation (Signed)
Patient last known well at [place] this morning she is cared for at home after a stroke with residual left hemiparesis.  She has a PEG tube.  Per son is usually talkative.  This am she has difficulty speaking.  Code Stroke Called at [phone].  Patient arrived via EMS at 0919. Dr Vanita Panda and Dr Leonel Ramsay met patient upon arrival.  Initially unable to draw labs.  Stat head CT done. Additional IV started, labs drawn and sent.  Orders for CTA.  Her breathing pattern changed and she became quiet.  Placed patient on monitor and obtained VS.  Brady to PEA to asystole. Started Chest compressions and code blue called.  Regained pulse for a short time.  Son at bedside.  Patient passed.

## 2019-02-20 NOTE — Code Documentation (Signed)
No pulse.  Compressions continued.  Notified not to intubate by Dr Vanita Panda d/t pt's wishes.

## 2019-02-20 NOTE — Code Documentation (Signed)
CPR initiated.  Son was unable to give code status to EMS.  MD attempting to find status in chart.

## 2019-02-20 DEATH — deceased

## 2019-04-25 ENCOUNTER — Ambulatory Visit: Payer: Medicare Other | Admitting: Adult Health

## 2020-02-15 IMAGING — DX DG CHEST 1V PORT
1 series · 1 of 1 positions shown · non-contrast
Comparison: 11/25/2018

CLINICAL DATA: Altered mental status

EXAM:
PORTABLE CHEST 1 VIEW

[chest ap]
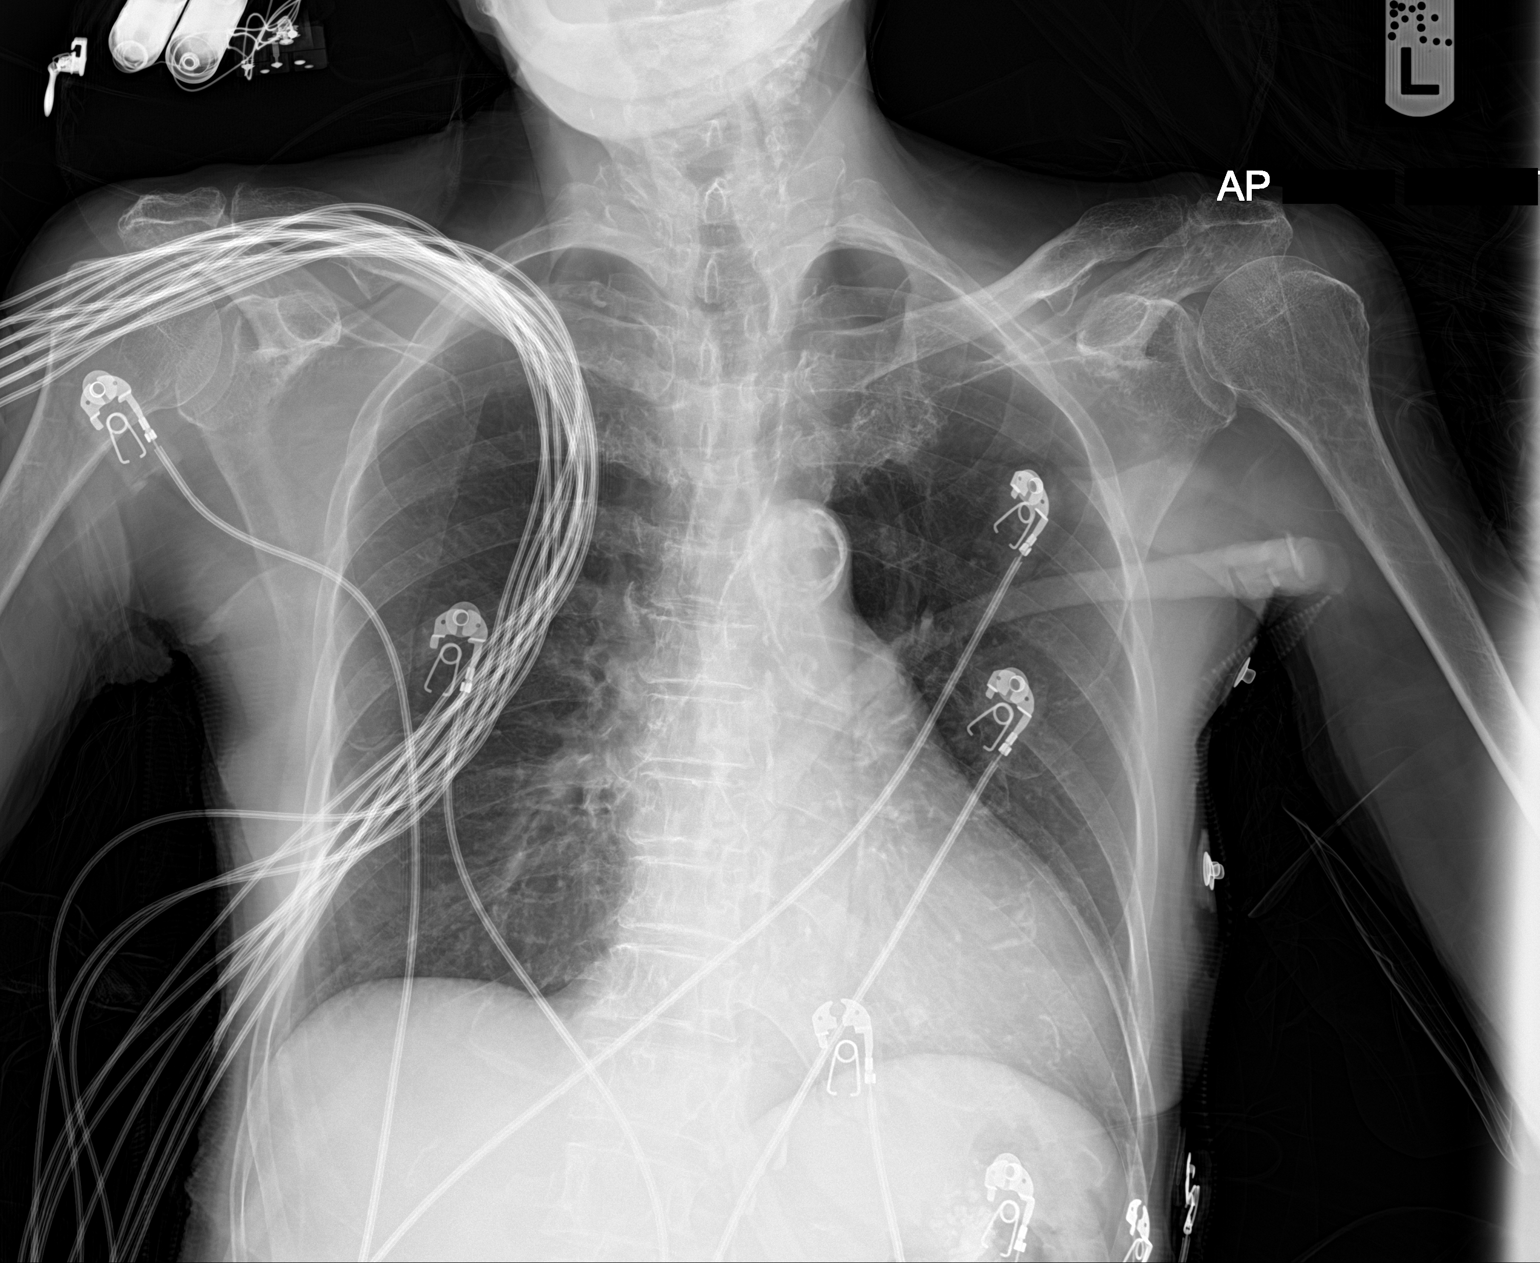

[1 of 1 positions shown; findings below may reference images not displayed]

FINDINGS: Numerous wires and leads overlying the patient's chest. Curvilinear
tubing over the left chest. No focal opacity or pleural effusion.
Mild cardiomegaly with aortic atherosclerosis. No pneumothorax.
IMPRESSION: No active disease.  Mild cardiomegaly.

## 2020-04-17 IMAGING — CT CT HEAD CODE STROKE
3 of 7 series · 14 of 47 positions shown, 16 images · non-contrast
Comparison: 12/18/2018

CLINICAL DATA: Code stroke. History of right MCA stroke with
resultant left-sided weakness. New right leg weakness and slurred
speech today.

EXAM:
CT HEAD WITHOUT CONTRAST
TECHNIQUE: Contiguous axial images were obtained from the base of the skull
through the vertex without intravenous contrast.

[Series 4: head 5.0 st · axial · 0.43mm/px · z∈[+1272,+1387]mm · 8 of 31 slices shown, 10 images]
[im 4/31  brain]
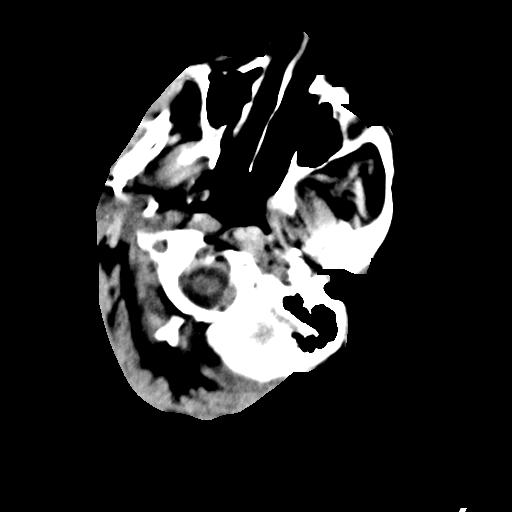
[im 4/31  bone]
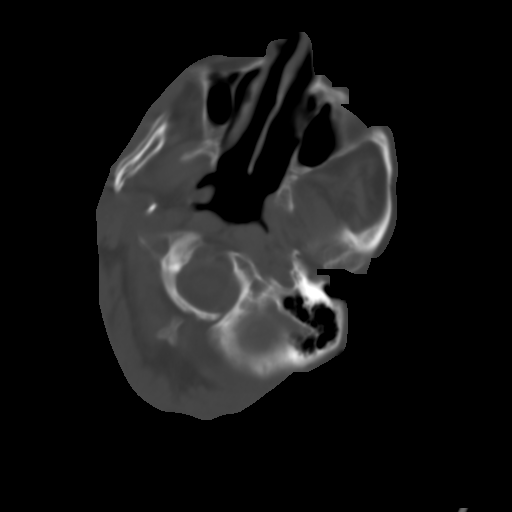
[im 7/31  brain]
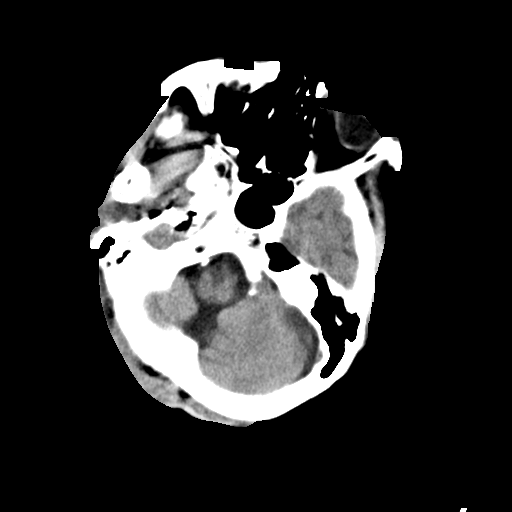
[im 11/31  brain]
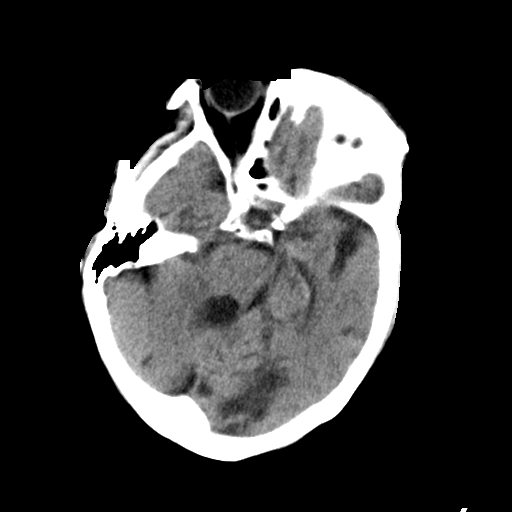
[im 14/31  brain]
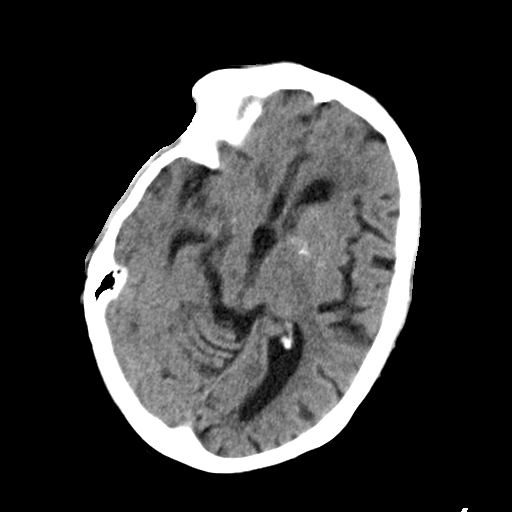
[im 17/31  brain]
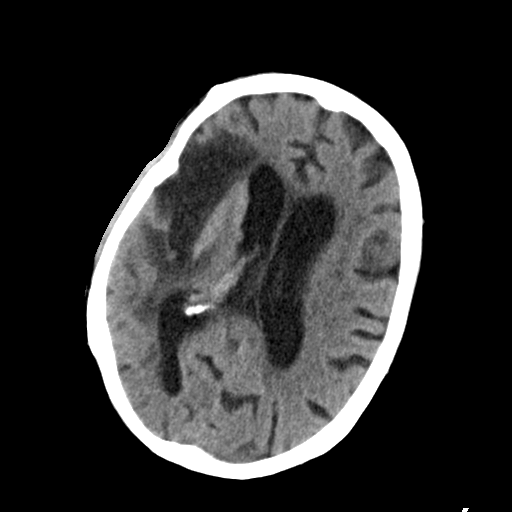
[im 17/31  bone]
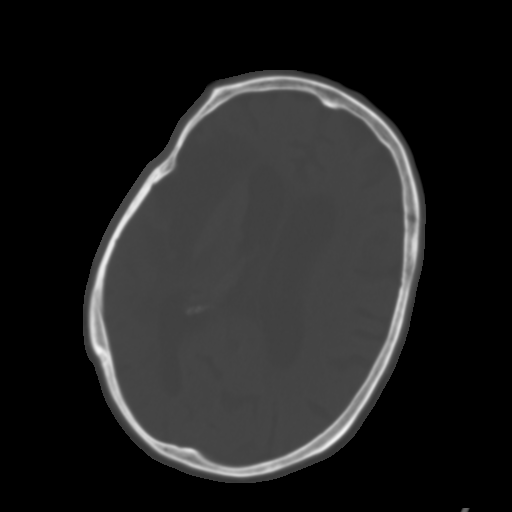
[im 21/31  brain]
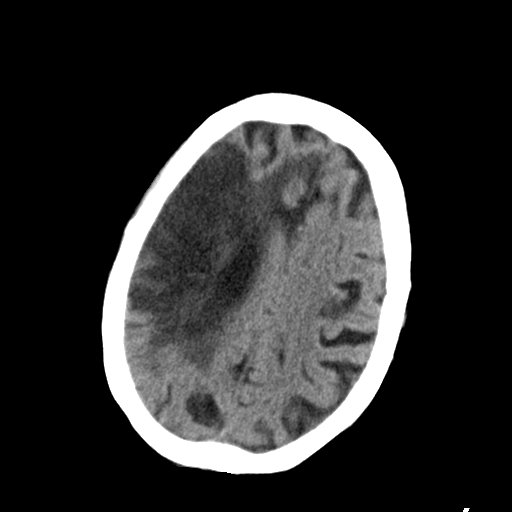
[im 24/31  brain]
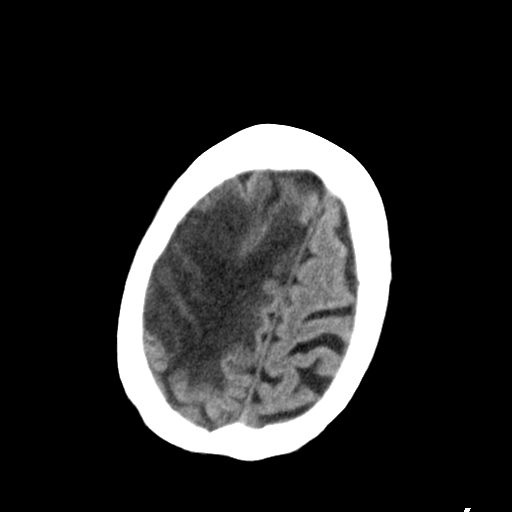
[im 27/31  brain]
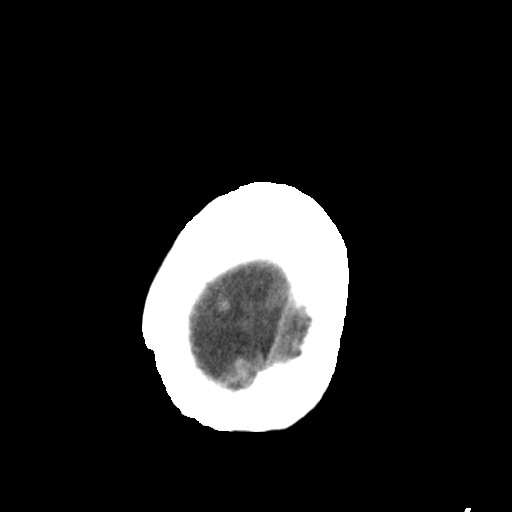

[Series 5: head 3.0 cor st · coronal · 0.33mm/px · 3 of 65 slices shown]
[im 17/65  brain]
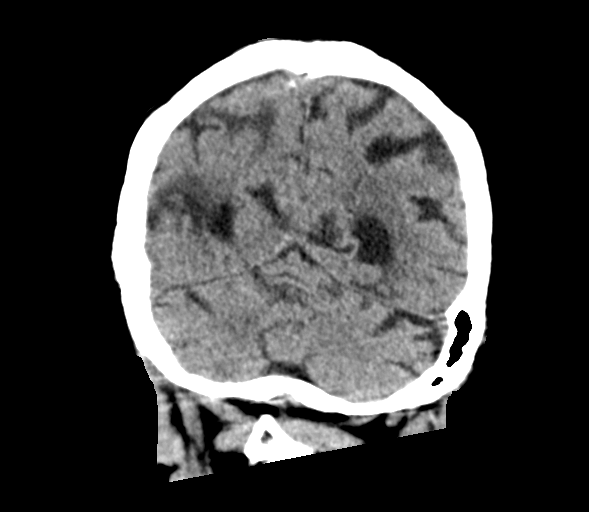
[im 33/65  brain]
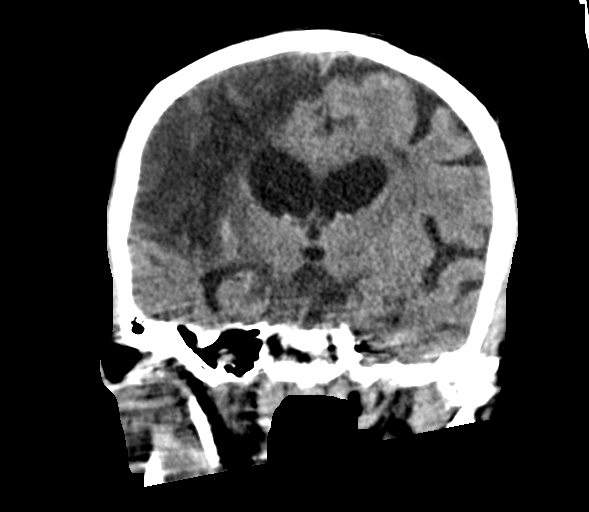
[im 49/65  brain]
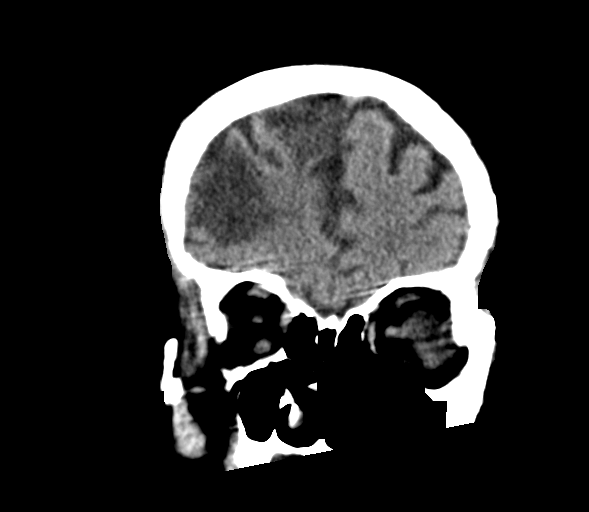

[Series 10: head 3.0 sag st · sagittal · 0.32mm/px · 3 of 49 slices shown]
[im 4/49  brain]
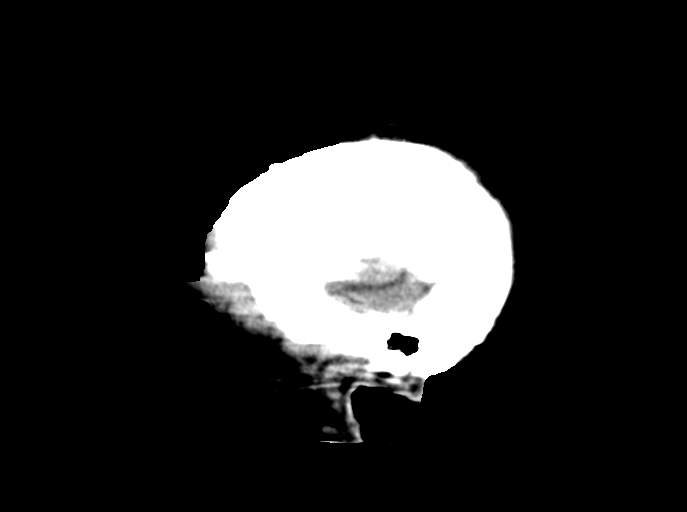
[im 12/49  brain]
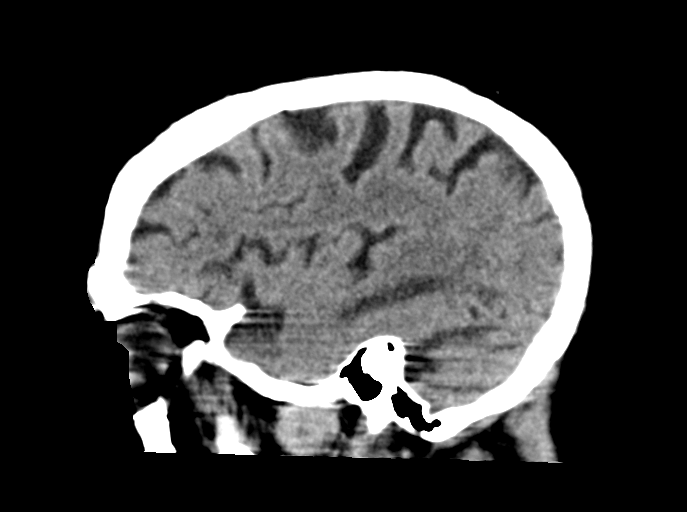
[im 20/49  brain]
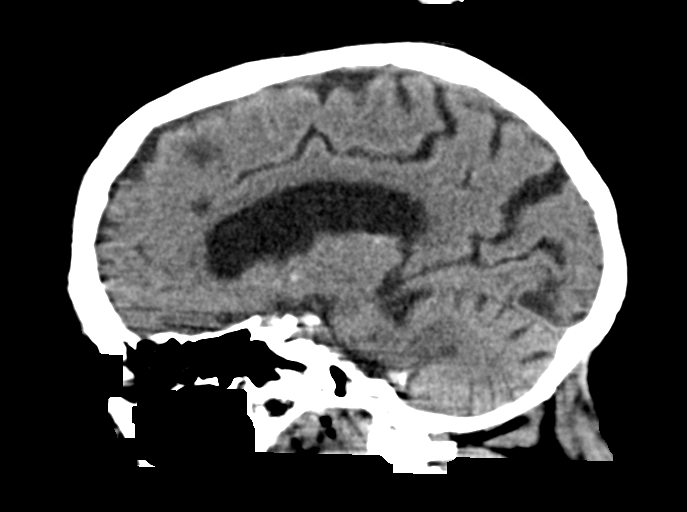

[14 of 47 positions shown; findings below may reference images not displayed]

FINDINGS: Brain: A large right MCA infarct demonstrates expected interval
evolution, now chronic in appearance with development of
encephalomalacia. No definite acute right cerebral infarct is
identified, however it would be difficult to exclude recent ischemia
near the margins of the chronic infarct. Hypoattenuation in the
medulla and lower pons is felt to reflect beam hardening artifact,
also present to varying degrees on multiple prior CTs without an
abnormality on a 11/21/2018 MRI. Chronic left occipital and right
cerebellar infarcts are again noted. There is an approximately 1 cm
cortical infarct in the parasagittal left frontal lobe which is new
and may be acute. A small chronic cortical infarct is present in the
posterior left frontal lobe. No acute intracranial hemorrhage, mass,
midline shift, or extra-axial fluid collection is identified. There
is mild diffuse cerebral atrophy.

Vascular: Calcified atherosclerosis at the skull base. No hyperdense
vessel.

Skull: No fracture or focal osseous lesion.

Sinuses/Orbits: Visualized paranasal sinuses and mastoid air cells
are clear. Right cataract extraction is noted.

Other: None.

ASPECTS ([place] Stroke Program Early CT Score)

Not scored given the presence of a large chronic right MCA infarct.
IMPRESSION: 1. New and potentially acute small infarct in the parasagittal left
frontal lobe, left ACA territory.
2. Large chronic right MCA infarct.
3. Chronic left occipital, left frontal, and right cerebellar
infarcts.
4. No acute intracranial hemorrhage.

These results were communicated to Dr. Board at [DATE] [place]
02/18/2019by text page via the [employer] messaging system.
